# Patient Record
Sex: Male | Born: 1950
Health system: Southern US, Community
[De-identification: ages and names within clinical notes are randomized; demographics above are authoritative.]

## PROBLEM LIST (undated history)

## (undated) DIAGNOSIS — G5600 Carpal tunnel syndrome, unspecified upper limb: Secondary | ICD-10-CM

## (undated) DIAGNOSIS — G4730 Sleep apnea, unspecified: Secondary | ICD-10-CM

## (undated) DIAGNOSIS — M1990 Unspecified osteoarthritis, unspecified site: Secondary | ICD-10-CM

## (undated) DIAGNOSIS — G4733 Obstructive sleep apnea (adult) (pediatric): Secondary | ICD-10-CM

## (undated) DIAGNOSIS — T7840XA Allergy, unspecified, initial encounter: Secondary | ICD-10-CM

## (undated) DIAGNOSIS — I10 Essential (primary) hypertension: Secondary | ICD-10-CM

## (undated) HISTORY — DX: Sleep apnea, unspecified: G47.30

## (undated) HISTORY — PX: JOINT REPLACEMENT: SHX530

## (undated) HISTORY — PX: HERNIA REPAIR: SHX51

## (undated) HISTORY — DX: Carpal tunnel syndrome, unspecified upper limb: G56.00

## (undated) HISTORY — PX: KNEE SURGERY: SHX244

## (undated) HISTORY — PX: ROTATOR CUFF REPAIR: SHX139

## (undated) HISTORY — PX: NECK SURGERY: SHX720

## (undated) HISTORY — PX: SPINE SURGERY: SHX786

## (undated) HISTORY — PX: CERVICAL LAMINECTOMY: SHX94

## (undated) HISTORY — DX: Obstructive sleep apnea (adult) (pediatric): G47.33

## (undated) HISTORY — PX: BACK SURGERY: SHX140

## (undated) HISTORY — DX: Allergy, unspecified, initial encounter: T78.40XA

---

## 1998-12-03 ENCOUNTER — Encounter: Payer: Self-pay | Admitting: Orthopedic Surgery

## 1998-12-03 ENCOUNTER — Ambulatory Visit (HOSPITAL_COMMUNITY): Admission: RE | Admit: 1998-12-03 | Discharge: 1998-12-03 | Payer: Self-pay | Admitting: Orthopedic Surgery

## 2001-07-11 ENCOUNTER — Encounter: Admission: RE | Admit: 2001-07-11 | Discharge: 2001-07-11 | Payer: Self-pay | Admitting: Family Medicine

## 2001-07-11 ENCOUNTER — Encounter: Payer: Self-pay | Admitting: Family Medicine

## 2001-08-02 ENCOUNTER — Ambulatory Visit (HOSPITAL_COMMUNITY): Admission: RE | Admit: 2001-08-02 | Discharge: 2001-08-02 | Payer: Self-pay | Admitting: Neurosurgery

## 2001-08-02 ENCOUNTER — Encounter: Payer: Self-pay | Admitting: Neurosurgery

## 2001-08-05 ENCOUNTER — Inpatient Hospital Stay (HOSPITAL_COMMUNITY): Admission: RE | Admit: 2001-08-05 | Discharge: 2001-08-06 | Payer: Self-pay | Admitting: Neurosurgery

## 2001-08-05 ENCOUNTER — Encounter: Payer: Self-pay | Admitting: Neurosurgery

## 2002-04-26 ENCOUNTER — Ambulatory Visit (HOSPITAL_COMMUNITY): Admission: RE | Admit: 2002-04-26 | Discharge: 2002-04-26 | Payer: Self-pay | Admitting: Orthopedic Surgery

## 2003-04-24 ENCOUNTER — Ambulatory Visit (HOSPITAL_COMMUNITY): Admission: RE | Admit: 2003-04-24 | Discharge: 2003-04-24 | Payer: Self-pay | Admitting: Orthopedic Surgery

## 2003-05-21 ENCOUNTER — Ambulatory Visit (HOSPITAL_COMMUNITY): Admission: RE | Admit: 2003-05-21 | Discharge: 2003-05-21 | Payer: Self-pay | Admitting: Gastroenterology

## 2005-02-10 ENCOUNTER — Ambulatory Visit (HOSPITAL_BASED_OUTPATIENT_CLINIC_OR_DEPARTMENT_OTHER): Admission: RE | Admit: 2005-02-10 | Discharge: 2005-02-10 | Payer: Self-pay | Admitting: Orthopedic Surgery

## 2005-05-21 ENCOUNTER — Ambulatory Visit (HOSPITAL_BASED_OUTPATIENT_CLINIC_OR_DEPARTMENT_OTHER): Admission: RE | Admit: 2005-05-21 | Discharge: 2005-05-21 | Payer: Self-pay | Admitting: Family Medicine

## 2005-05-24 ENCOUNTER — Ambulatory Visit: Payer: Self-pay | Admitting: Internal Medicine

## 2006-03-12 ENCOUNTER — Ambulatory Visit (HOSPITAL_COMMUNITY): Admission: RE | Admit: 2006-03-12 | Discharge: 2006-03-12 | Payer: Self-pay | Admitting: Orthopedic Surgery

## 2006-04-05 ENCOUNTER — Ambulatory Visit (HOSPITAL_COMMUNITY): Admission: RE | Admit: 2006-04-05 | Discharge: 2006-04-05 | Payer: Self-pay | Admitting: Orthopedic Surgery

## 2006-05-26 ENCOUNTER — Ambulatory Visit (HOSPITAL_COMMUNITY): Admission: RE | Admit: 2006-05-26 | Discharge: 2006-05-27 | Payer: Self-pay | Admitting: Orthopedic Surgery

## 2008-07-06 ENCOUNTER — Ambulatory Visit (HOSPITAL_COMMUNITY): Admission: RE | Admit: 2008-07-06 | Discharge: 2008-07-06 | Payer: Self-pay | Admitting: Orthopedic Surgery

## 2010-09-15 ENCOUNTER — Encounter: Payer: Self-pay | Admitting: Internal Medicine

## 2010-09-15 ENCOUNTER — Ambulatory Visit
Admission: RE | Admit: 2010-09-15 | Discharge: 2010-09-15 | Payer: Self-pay | Source: Home / Self Care | Attending: Internal Medicine | Admitting: Internal Medicine

## 2010-09-15 ENCOUNTER — Other Ambulatory Visit: Payer: Self-pay | Admitting: Internal Medicine

## 2010-09-15 DIAGNOSIS — R413 Other amnesia: Secondary | ICD-10-CM | POA: Insufficient documentation

## 2010-09-15 LAB — BASIC METABOLIC PANEL
Chloride: 102 mEq/L (ref 96–112)
Creatinine, Ser: 0.8 mg/dL (ref 0.4–1.5)
GFR: 109.25 mL/min (ref >60.00)
Glucose, Bld: 93 mg/dL (ref 70–99)

## 2010-09-15 LAB — CBC WITH DIFFERENTIAL/PLATELET
Basophils Absolute: 0 10*3/uL (ref 0.0–0.1)
Basophils Relative: 0.3 % (ref 0.0–3.0)
Eosinophils Absolute: 0.2 10*3/uL (ref 0.0–0.7)
Eosinophils Relative: 2.5 % (ref 0.0–5.0)
Lymphocytes Relative: 26.7 % (ref 12.0–46.0)
Lymphs Abs: 1.7 10*3/uL (ref 0.7–4.0)
MCV: 93.2 fl (ref 78.0–100.0)
Monocytes Relative: 8.2 % (ref 3.0–12.0)
Neutrophils Relative %: 62.3 % (ref 43.0–77.0)
Platelets: 183 10*3/uL (ref 150.0–400.0)
RBC: 4.5 Mil/uL (ref 4.22–5.81)
WBC: 6.3 10*3/uL (ref 4.5–10.5)

## 2010-09-15 LAB — B12 AND FOLATE PANEL: Vitamin B-12: 1172 pg/mL — ABNORMAL HIGH (ref 211–911)

## 2010-09-15 LAB — HEPATIC FUNCTION PANEL
ALT: 26 U/L (ref 0–53)
AST: 22 U/L (ref 0–37)

## 2010-09-15 LAB — LIPID PANEL
Cholesterol: 234 mg/dL — ABNORMAL HIGH (ref 0–200)
Triglycerides: 72 mg/dL (ref 0.0–149.0)
VLDL: 14.4 mg/dL (ref 0.0–40.0)

## 2010-09-15 LAB — PSA: PSA: 1.37 ng/mL (ref 0.10–4.00)

## 2010-09-15 LAB — CONVERTED CEMR LAB

## 2010-09-24 NOTE — Assessment & Plan Note (Signed)
Summary: NEW PT CPX/AETNA/#/CD   Vital Signs:  Patient profile:   60 year old male Height:      72 inches Weight:      198 pounds BMI:     26.95 O2 Sat:      96 % on Room air Temp:     98.4 degrees F oral Pulse rate:   72 / minute Pulse rhythm:   regular Resp:     16 per minute BP sitting:   124 / 70  (left arm) Cuff size:   large  Vitals Entered By: Rock Nephew CMA (September 15, 2010 10:45 AM)  Nutrition Counseling: Patient's BMI is greater than 25 and therefore counseled on weight management options.  O2 Flow:  Room air CC: New patient physical, Preventive Care Is Patient Diabetic? No Pain Assessment Patient in pain? no        Primary Care Provider:  Etta Grandchild MD  CC:  New patient physical and Preventive Care.  History of Present Illness: New to me for a complete physical but he also complains of "memory loss" over the last year. He tells me that he is used to doing 3 things at once but now only gets one thing at a time done or will forget what he is doing. He works very effectively  in [employer]. His wife is not concerned about his memory. He does well with his ADL's and does not lose items or get lost when traveling.  Preventive Screening-Counseling & Management  Alcohol-Tobacco     Alcohol drinks/day: 1     Alcohol type: wine     >5/day in last 3 mos: no     Alcohol Counseling: not indicated; use of alcohol is not excessive or problematic     Feels need to cut down: no     Feels annoyed by complaints: no     Feels guilty re: drinking: no     Needs 'eye opener' in am: no     Smoking Status: never     Tobacco Counseling: not indicated; no tobacco use  Caffeine-Diet-Exercise     Does Patient Exercise: yes  Hep-HIV-STD-Contraception     Hepatitis Risk: no risk noted     HIV Risk: risk noted     STD Risk: no risk noted     Dental Visit-last 6 months yes     Dental Care Counseling: to seek dental care; no dental care within six  months     TSE monthly: yes     Testicular SE Education/Counseling to perform regular STE     Sun Exposure-Excessive: no  Safety-Violence-Falls     Seat Belt Use: yes     Helmet Use: yes     Firearms in the Home: firearms in the home     Firearm Counseling: to practice firearm safety     Smoke Detectors: yes     Violence in the Home: no risk noted      Sexual History:  currently monogamous.        Drug Use:  no.        Blood Transfusions:  no.    Medications Prior to Update: 1)  None  Current Medications (verified): 1)  None  Allergies (verified): No Known Drug Allergies  Past History:  Past Medical History: OSA on CPAP Allergic rhinitis CTS in both wrists  Past Surgical History: Cervical laminectomy bilateral shoulder rotator cuff repair  Family History: Family History High cholesterol Family History  of Prostate Cancer  Social History: Retired Married Never Smoked Alcohol use-yes Drug use-no Regular exercise-yes Smoking Status:  never Drug Use:  no Does Patient Exercise:  yes Hepatitis Risk:  no risk noted HIV Risk:  risk noted STD Risk:  no risk noted Dental Care w/in 6 mos.:  yes Sun Exposure-Excessive:  no Seat Belt Use:  yes Sexual History:  currently monogamous Blood Transfusions:  no  Review of Systems  The patient denies anorexia, fever, weight loss, weight gain, hoarseness, chest pain, syncope, dyspnea on exertion, peripheral edema, prolonged cough, headaches, hemoptysis, abdominal pain, melena, hematochezia, severe indigestion/heartburn, hematuria, suspicious skin lesions, difficulty walking, depression, unusual weight change, abnormal bleeding, enlarged lymph nodes, angioedema, and testicular masses.   GU:  Denies decreased libido, discharge, dysuria, erectile dysfunction, genital sores, hematuria, incontinence, nocturia, urinary frequency, and urinary hesitancy. Neuro:  Complains of memory loss; denies brief paralysis, difficulty with  concentration, disturbances in coordination, falling down, headaches, inability to speak, numbness, poor balance, seizures, sensation of room spinning, tingling, tremors, visual disturbances, and weakness.  Physical Exam  General:  alert, well-developed, well-nourished, well-hydrated, appropriate dress, normal appearance, healthy-appearing, cooperative to examination, and good hygiene.   Head:  normocephalic, atraumatic, no abnormalities observed, and no abnormalities palpated.   Eyes:  vision grossly intact, pupils equal, pupils round, and pupils reactive to light.   Ears:  R ear normal and L ear normal.   Nose:  External nasal examination shows no deformity or inflammation. Nasal mucosa are pink and moist without lesions or exudates. Mouth:  Oral mucosa and oropharynx without lesions or exudates.  Teeth in good repair. Neck:  supple, full ROM, no masses, no thyromegaly, no thyroid nodules or tenderness, no JVD, normal carotid upstroke, no carotid bruits, no cervical lymphadenopathy, and no neck tenderness.   Chest Wall:  No deformities, masses, tenderness or gynecomastia noted. Breasts:  No masses or gynecomastia noted Lungs:  normal respiratory effort, no intercostal retractions, no accessory muscle use, normal breath sounds, no dullness, no fremitus, no crackles, and no wheezes.   Heart:  normal rate, regular rhythm, no murmur, no gallop, no rub, and no JVD.   Abdomen:  soft, non-tender, normal bowel sounds, no distention, no masses, no guarding, no rigidity, no rebound tenderness, no abdominal hernia, no inguinal hernia, no hepatomegaly, and no splenomegaly.   Rectal:  No external abnormalities noted. Normal sphincter tone. No rectal masses or tenderness. Genitalia:  circumcised, no hydrocele, no varicocele, no scrotal masses, no testicular masses or atrophy, no cutaneous lesions, and no urethral discharge.   Prostate:  no gland enlargement, no nodules, no asymmetry, and no induration.   Msk:   normal ROM, no joint tenderness, no joint swelling, no joint warmth, no redness over joints, no joint deformities, no joint instability, and no crepitation.   Pulses:  R and L carotid,radial,femoral,dorsalis pedis and posterior tibial pulses are full and equal bilaterally Extremities:  No clubbing, cyanosis, edema, or deformity noted with normal full range of motion of all joints.   Neurologic:  No cranial nerve deficits noted. Station and gait are normal. Plantar reflexes are down-going bilaterally. DTRs are symmetrical throughout. Sensory, motor and coordinative functions appear intact. Skin:  turgor normal, color normal, no rashes, no suspicious lesions, no ecchymoses, no petechiae, no purpura, no ulcerations, and no edema.   Cervical Nodes:  no anterior cervical adenopathy and no posterior cervical adenopathy.   Axillary Nodes:  no R axillary adenopathy and no L axillary adenopathy.   Inguinal Nodes:  no R inguinal adenopathy and no L inguinal adenopathy.   Psych:  Cognition and judgment appear intact. Alert and cooperative with normal attention span and concentration. No apparent delusions, illusions, hallucinations   Impression & Recommendations:  Problem # 1:  MEMORY LOSS (ICD-780.93) Assessment New I think this a benign scenario Orders: Venipuncture ([address]) TLB-B12 + Folate Pnl (60454_09811-B14/NWG) TLB-BMP (Basic Metabolic Panel-BMET) (80048-METABOL) TLB-CBC Platelet - w/Differential (85025-CBCD) TLB-Hepatic/Liver Function Pnl (80076-HEPATIC) TLB-TSH (Thyroid Stimulating Hormone) (84443-TSH) TLB-Lipid Panel (80061-LIPID) TLB-PSA (Prostate Specific Antigen) (84153-PSA)  Problem # 2:  ROUTINE GENERAL MEDICAL EXAM@HEALTH  CARE FACL (ICD-V70.0)  Orders: Venipuncture (95621) TLB-B12 + Folate Pnl (30865_78469-G29/BMW) TLB-BMP (Basic Metabolic Panel-BMET) (80048-METABOL) TLB-CBC Platelet - w/Differential (85025-CBCD) TLB-Hepatic/Liver Function Pnl (80076-HEPATIC) TLB-TSH (Thyroid  Stimulating Hormone) (84443-TSH) TLB-Lipid Panel (80061-LIPID) TLB-PSA (Prostate Specific Antigen) (84153-PSA) EKG w/ Interpretation (93000) Hemoccult Guaiac-1 spec.(in office) (82270)  Colorectal Screening:  Current Recommendations:    Hemoccult: NEG X 1 today    Colonoscopy recommended: patient defers today but will consider in the future  Colonoscopy Results:    Date of Exam: 10/28/2004    Results: Normal  PSA Screening:    Reviewed PSA screening recommendations: PSA ordered  Immunization & Chemoprophylaxis:    Tetanus vaccine: Historical  (08/17/2005)  Patient Instructions: 1)  Please schedule a follow-up appointment as needed. 2)  Schedule a colonoscopy/sigmoidoscopy to help detect colon cancer.   Orders Added: 1)  Venipuncture [36415] 2)  TLB-B12 + Folate Pnl [82746_82607-B12/FOL] 3)  TLB-BMP (Basic Metabolic Panel-BMET) [80048-METABOL] 4)  TLB-CBC Platelet - w/Differential [85025-CBCD] 5)  TLB-Hepatic/Liver Function Pnl [80076-HEPATIC] 6)  TLB-TSH (Thyroid Stimulating Hormone) [84443-TSH] 7)  TLB-Lipid Panel [80061-LIPID] 8)  TLB-PSA (Prostate Specific Antigen) [41324-MWN] 9)  New Patient 40-64 years [99386] 10)  EKG w/ Interpretation [93000] 11)  Hemoccult Guaiac-1 spec.(in office) [82270]    Preventive Care Screening  Last Tetanus Booster:    Date:  08/17/2005    Results:  Historical

## 2010-09-24 NOTE — Letter (Signed)
Summary: Lipid Letter  [facility]  [address] [place], [place] [address]   Phone: [phone]  Fax: [phone]    09/15/2010  Ronald Franklin [address] [place], [place]  [address]  Dear Ronald Franklin:  We have carefully reviewed your last lipid profile from  and the results are noted below with a summary of recommendations for lipid management.    Cholesterol:       234     Goal: <200 too high   HDL "good" Cholesterol:   57.84     Goal: >40 good   LDL "bad" Cholesterol:   173     Goal: <130 too high   Triglycerides:       72.0     Goal: <150    your other labs look real good, let me know if you want to start a cholesterol medicine    TLC Diet (Therapeutic Lifestyle Change): Saturated Fats & Transfatty acids should be kept < 7% of total calories ***Reduce Saturated Fats Polyunstaurated Fat can be up to 10% of total calories Monounsaturated Fat Fat can be up to 20% of total calories Total Fat should be no greater than 25-35% of total calories Carbohydrates should be 50-60% of total calories Protein should be approximately 15% of total calories Fiber should be at least 20-30 grams a day ***Increased fiber may help lower LDL Total Cholesterol should be < 200mg /day Consider adding plant stanol/sterols to diet (example: Benacol spread) ***A higher intake of unsaturated fat may reduce Triglycerides and Increase HDL    Adjunctive Measures (may lower LIPIDS and reduce risk of Heart Attack) include: Aerobic Exercise (20-30 minutes 3-4 times a week) Limit Alcohol Consumption Weight Reduction Aspirin 75-81 mg a day by mouth (if not allergic or contraindicated) Dietary Fiber 20-30 grams a day by mouth     Current Medications:  None If you have any questions, please call. We appreciate being able to work with you.   Sincerely,    [facility] Etta Grandchild MD

## 2010-12-30 NOTE — Op Note (Signed)
NAME:  Ronald Franklin, Ronald Franklin NO.:  [id number]   MEDICAL RECORD NO.:  [id number]          PATIENT TYPE:  INP   LOCATION:  2550                         FACILITY:  [facility]   PHYSICIAN:  Almedia Balls. Ranell Patrick, M.D. DATE OF BIRTH:  November 09, 1950   DATE OF PROCEDURE:  07/06/2008  DATE OF DISCHARGE:  07/06/2008                               OPERATIVE REPORT   PREOPERATIVE DIAGNOSES:  Right shoulder rotator cuff tear, subluxation  of the biceps tendon with possible superior labrum anterior and  posterior lesion as well as AC joint arthritis.   POSTOPERATIVE DIAGNOSES:  1. Right shoulder rotator cuff tear.  2. Right shoulder subscapularis tear.  3. Dislocating biceps tendon with superior labrum anterior and      posterior lesion.  4. Glenohumeral degenerative joint disease.  5. Grade 3 chondromalacia and impingement as well as symptomatic      acromioclavicular joint arthritis.   PROCEDURE PERFORMED:  Right shoulder exam under anesthesia, shoulder  arthroscopy, extensive intraarticular debridement of SLAP lesion,  rotator cuff tear, subscapularis tear, biceps tenotomy, arthroscopic  subacromial decompression, mini-open rotator cuff repair, open biceps  tenodesis, and open distal clavicle excision.   SURGEON:  Almedia Balls. Ranell Patrick, M.D.   ASSISTANT:  Donnie Coffin. Dixon, PA-C.   ANESTHESIA:  General anesthesia plus interscalene block anesthesia was  used.   ESTIMATED BLOOD LOSS:  Minimal.   FLUID REPLACEMENT:  1500 mL crystalloid.   INSTRUMENT COUNT:  Correct.   COMPLICATIONS:  None.   Preoperative antibiotics were given.   INDICATIONS:  The patient is a 60 year old male with worsening right  shoulder pain secondary to rotator cuff tear as well as AC joint  arthritis.  The patient has a subluxing biceps tendon and suspected  subscapularis tear as well.  The patient presents now for operative  treatment having failed conservative management.   DESCRIPTION OF PROCEDURE:  After an  adequate level of anesthesia was  achieved, the patient was positioned in the modified beach chair  position.  Right shoulder was examined under anesthesia.  The patient  had full passive range of motion of the shoulder with no instability.  We sterilely prepped and draped the right shoulder and in the end of the  shoulder used arthroscopic portals in the usual fashion.  Anterior and  posterior lateral portals were created using a 11-blade scalpel and  introduction of the cannula into the joint using blunt obturators.  We  identified quite a bit of inflammation in the joint with synovitis.  There was a torn superior labrum with unstable biceps anchor.  The  biceps was dislocated into the substance of the subscapularis tendon  which had an upper portion tear.  We performed a biceps tenotomy and  labral debridement with superior labral tear.  We then debrided a little  of the subscapular upper insertion.  This appeared to be limited to the  upper 10%.  The rotator cuff was torn, this was a linear supraspinatus  and infraspinatus tear.  We performed debridement of that tissue, it was  greater than 30% in thickness of the tendon in the foot.  The  anteroinferior labrum was strongly debrided using basket forceps and  motorized shaver.  There is some glenohumeral chondromalacia noted grade  3 in multiple areas.  Posterior labrum was intact.  At this point, we  went ahead and placed the scope in subacromial space, performed a  thorough bursectomy and acromioplasty with release of AC ligament.  At  this point, we concluded the arthroscopic portion of the surgery.  We  made a Saber incision along the [facility] joint.  Dissection was carried down  through the skin and subcutaneous tissues.  We split the deltoid-  trapezius fascia in line with distal clavicle.  Subperiosteal dissection  in the distal clavicle was performed followed by excision of distal 4 mm  using oscillating saw.  We thoroughly irrigated  the AC interval and then  applied bone wax to cut into clavicle.  We then repaired the deltoid-  trapezius fascia with 0-Vicryl suture interrupted figure-of-eight  followed by 2-0 Vicryl subcutaneous closure 4-0 Monocryl of skin.  This  mini open incision was created at the anterolateral border of the  acromion extending down for about 3 or 4 cm.  Dissection was carried  down through subcutaneous tissues.  We split the deltoid and the raphe  between the anterior and lateral heads, identified the bicipital groove,  incised the soft tissue overlying the bicipital groove and then  delivered the tendon and the wound.  We whip stitched at the anchor  point forward with the elbow at 90 degrees and made tension using #2  FiberWire suture.  We then placed a single Panalok 3.5 anchor 3-4 at the  bicipital groove.  We scuffed up before the bicipital groove first.  We  brought those sutures up with free needles, taper needles through the  whip stitched area in the biceps tendon to prevent pullout.  We then  tied the tendon flesh in the bicipital canal or groove.  Once that was  tied, we went ahead and then cut the end of the biceps off.  We went  ahead then and oversew the remaining biceps into the soft tissue  overlying the biceps groove with a 0-Vicryl suture of figure-of-eight.  We had a nice low-profile repair.  At this point, we went ahead and  addressed the rotator cuff tear as palpably torn just about a finger-  breadth posterior to the biceps groove.  We went and incised that along  with the fibers.  There was quite a bit of degenerative tissue which we  removed using rongeurs, curettes.  We went ahead and scuffed up the  rotator cuff foot print.  We went ahead and placed a single 5.5 Bio-  Corkscrew anchor adjacent to the articular muscle and placed a mattress  suture.  #2 FiberWire medial to the repair site.  We placed it side-to-  side #2 FiberWire suture which we did not tie yet.  We went  ahead and  freed up the rotator cuff with the Cobb elevator on both sides of the  cuff.  The posterior aspect moved nicely up to meet the forward aspect  of the rotator cuff which was the attachment was not violated.  At this  point, we brought those suture anchor sutures up in a mattress fashion  to restore the medial foot print and tie those.  We tied the side-to-  side suture and did another one with side-to-side and then brought the  medial most side-to-side suture and the mattress suture over the top of  the entire repair to apply at the lateral portion of the foot print and  used a single 4.5 push lock out laterally.  Had a nice low profile  repair, took the shoulder through a full range of motion, no impingement  was noted.  We thoroughly irrigated the interval and then repaired the  deltoid to itself with 0-Vicryl figure-of-eight followed by 2-0 Vicryl  subcutaneous closure and 4-0 Monocryl to the skin.  Steri-Strips were  applied followed by a sterile dressing.      Almedia Balls. Ranell Patrick, M.D.  Electronically Signed     SRN/MEDQ  D:  07/06/2008  T:  07/07/2008  Job:  [id number]

## 2011-01-02 NOTE — Op Note (Signed)
NAME:  Ronald Franklin, Ronald Franklin                         ACCOUNT NO.:  [id number]   MEDICAL RECORD NO.:  [id number]                   PATIENT TYPE:  AMB   LOCATION:  DAY                                  FACILITY:  [facility]   PHYSICIAN:  Marlowe Kays, M.D.               DATE OF BIRTH:  08/31/1950   DATE OF PROCEDURE:  04/24/2003  DATE OF DISCHARGE:                                 OPERATIVE REPORT   PREOPERATIVE DIAGNOSIS:  Torn medial meniscus, left knee.   POSTOPERATIVE DIAGNOSIS:  1. Torn medial meniscus.  2. Grade 2/4 chondromalacia, medial femoral condyle, patella left knee.   OPERATION/PROCEDURE:  1. Left knee arthroscopy with one partial medial meniscectomy.  2. Shaving of medial femoral condyle, patella.   SURGEON:  Marlowe Kays, M.D.   ANESTHESIA:  General.   PATHOLOGY AND INDICATIONS:  Injured his left knee at work in early July  because of persistent pain.  Obtained an MRI which was performed on April 06, 2003, demonstrating some early patellar cartilage thinning and a  horizontal tear of the posterior horn body of the medial meniscus.  At  surgery also had disruption of the anterior third of the medial meniscus  with a tear in line with the meniscus and also grade 2/4 chondromalacia of  the medial femoral condyle, portion of which might have been due to digging  of this portion of the meniscus into the medial femoral condyle.  Lateral  compartment of the knee joint and ACL look normal.   DESCRIPTION OF PROCEDURE:  Satisfactory general anesthesia.  Pneumatic  tourniquet.  Thigh stabilizer.  Left knee was prepped with DuraPrep and  draped in the sterile field.  Ace wrap to the right leg.  Superior medial  saline inflow.  First from an anterolateral portal, medial compartment of  the knee joint was evaluated.  The abnormality of the anterior third of the  medial meniscus was noted and after debriding out of synovium, I was able to  place a probe beneath the underneath a  bucket-handle-type tear demonstrating  it.  I then cut the meniscus at its junction with the anterior mid third  with small scissors and resected the torn partially cut piece, as well as  smoothing down the junction between normal and abnormal meniscus.  He also  had at least one free fragment of meniscus in the joint which I pictured and  removed.  Smoothed down gently the medial femoral condyle with the shaver.  Posteriorly had a badly comminuted complex tear which I removed with a  combination of small scissors, baskets and shaving down until smooth with a  3.5 shaver.  Looking up __________ suprapatellar area is where the patella  particularly at its apex which I was only partially able to shave down  through this portal.  Then reverse portals laterally, this compartment  looked normal, but I was able the shave down the patella,  smoothing down  with final pictures being taken.  The knee joint was irrigated until clear  and all four __________ removed.  The two anterior portals were closed with  4-0 nylon.  Twenty cc of 0.5% Marcaine with adrenalin and 4 mg of morphine  were then instilled  through the entire apparatus which was removed and portals closed with 4-0  nylon as well.  Betadine, Adaptic and dry sterile dressing were applied.  The tourniquet was released.  He tolerated the procedure well and taken to  the recovery room in satisfactory condition.  There were no complications.                                                Marlowe Kays, M.D.    JA/MEDQ  D:  04/24/2003  T:  04/24/2003  Job:  [id number]

## 2011-01-02 NOTE — Op Note (Signed)
NAME:  Ronald Franklin, Ronald Franklin NO.:  [id number]   MEDICAL RECORD NO.:  [id number]          PATIENT TYPE:  OIB   LOCATION:  0098                         FACILITY:  [facility]   PHYSICIAN:  Almedia Balls. Ranell Patrick, M.D. DATE OF BIRTH:  03-19-1951   DATE OF PROCEDURE:  05/26/2006  DATE OF DISCHARGE:  05/27/2006                                 OPERATIVE REPORT   PREOPERATIVE DIAGNOSIS:  Left proximal biceps rupture.   POSTOPERATIVE DIAGNOSIS:  Left proximal biceps rupture.   PROCEDURE PERFORMED:  Left shoulder proximal biceps tenodesis.   ATTENDING SURGEON:  Beverely Low, M.D.   ASSISTANT:  None.   ANESTHESIA:  General anesthesia was used plus interscalene block.   ESTIMATED BLOOD LOSS:  Minimal.   FLUID REPLACEMENT:  1200 mL crystalloid.   INSTRUMENT COUNT:  Correct.   COMPLICATIONS:  None.   PERIOPERATIVE ANTIBIOTICS:  Given.   INDICATIONS:  The patient is a 60 year old male status post recent shoulder  arthroscopy 6 weeks ago.  The patient underwent debridement of his superior  labral tear and treated posteriorly with biceps tenodesis using Arthrex Bio-  Tenodesis Screw.  The patient was doing well up until several days ago when  he fell holding onto a heavy object and fell with his entire body, weight  back on the arm.  He felt an immediate pop in the arm and noticed ecchymosis  and deformity consistent with a proximal biceps rupture.  The patient  presented to the physical therapist, who suspected that, brought him over to  orthopedics, and we confirmed the diagnosis of a proximal biceps rupture.  The patient presents now for treatment of the re-rupture of his biceps.  Informed consent was obtained.   DESCRIPTION OF PROCEDURE:  After an adequate level of anesthesia was  achieved, the patient was positioned in the modified beach chair position.  Left shoulder was sterilely prepped and draped in the usual manner.  We  first entered through the prior biceps tenodesis  incision overlying the  biceps groove, dissection carried through subcutaneous tissues.  Using the  Bovie, I identified the deltoid, split it in its prior incision which was in  line with the muscle fibers and the raphe between the anterior and lateral  head.  We identified the bicipital groove inside the soft tissue which has  healed over the top of that and identified the screw which was still in  place in the hole in the anterior humerus.  In addition, the sutures were  still in place.  I pulled the screw out.  Sutures were still attached to  about a 25 mm portion of tendon, and the tendon had torn just below the  suture line.  At this point, we went ahead and made a separate incision down  further on the anterior aspect of the biceps.  Longitudinal incision was  begun with a 10 blade scalpel.  Dissection was carried sharply in __________  using Bovie electrocautery, identifying the biceps muscle and tendon, freed  it up from surrounding soft tissues, whip-stitched with #2 Fibrewire suture,  and then delivered that up through a  soft tissue tunnel that I created back  into the prior surgical site.  Next, we went ahead and looked at our tendon  length.  We were going to have to move the hole down the humerus about 30 mm  just to be able to have enough tendon for tenodesis using the Bio-Tenodesis  System.  We then drilled an 8 mm hole, went in and placed the tendon in that  and tenodesed at mid tension with the elbow at 90 degrees with a 9 x 23 mm  Bio-Tenodesis Screw by Arthrex.  We had excellent purchase on the screw.  We  went ahead and tied the 2 sutures to each other and then closed the soft  tissue overlying the upper portion of the soft tissue dissection in that  upper wound, and then we closed the deltoid with 0 Vicryl suture.  Subcutaneous closure with 2-0 Vicryl in both wounds and then 4-0 Monocryl  running subcuticular stitch in both areas.  Steri-Strips were applied  followed by  a sterile dressing and a shoulder sling.  The patient taken to  the recovery room.           ______________________________  Almedia Balls Ranell Patrick, M.D.     SRN/MEDQ  D:  05/26/2006  T:  05/28/2006  Job:  [id number]

## 2011-01-02 NOTE — Op Note (Signed)
NAME:  Ronald Franklin, Ronald Franklin NO.:  [id number]   MEDICAL RECORD NO.:  [id number]          PATIENT TYPE:  OIB   LOCATION:  0098                         FACILITY:  [facility]   PHYSICIAN:  Almedia Balls. Ranell Patrick, M.D. DATE OF BIRTH:  27-May-1951   DATE OF PROCEDURE:  04/05/2006  DATE OF DISCHARGE:                                 OPERATIVE REPORT   PREOPERATIVE DIAGNOSIS:  Left shoulder pain secondary to superior labral  tear anterior to posterior as well as symptomatic acromioclavicular joint  arthritis.   POSTOPERATIVE DIAGNOSES:  1. Left shoulder superior labral tear anterior to posterior with unstable      biceps anchor.  2. Left shoulder partial-thickness rotator cuff tear.  3. Left shoulder acromioclavicular joint arthritis, symptomatic.  4. No sign of subscapularis tear.  5. Glenoid chondromalacia grade 4, focal.   PROCEDURE PERFORMED:  Left shoulder examination under anesthesia, shoulder  arthroscopy with extensive intraarticular debridement of torn superior  labrum anterior to posterior with arthroscopic biceps tenotomy, followed by  arthroscopic debridement of partial-thickness r cuff tear, arthroscopic  subacromial decompression with an open biceps tenodesis in the groove using  7 x 23-mm Arthrex BioTenodesis screw, open distal clavicle excision, and  arthroscopic abrasion chondroplasty.   ATTENDING SURGEON:  Almedia Balls. Ranell Patrick, M.D.   ASSISTANT:  Konrad Felix Dixon, P.A.-C.   ANESTHESIA:  General plus interscalene block anesthesia used.   ESTIMATED BLOOD LOSS:  Minimal.   FLUID REPLACEMENT:  1200 mL crystalloid.   INSTRUMENT COUNTS:  Correct.   COMPLICATIONS:  None.   ANTIBIOTICS:  Given.   INDICATIONS:  The patient is a 60 year old male with a history of worsening  left shoulder pain.  The patient failed conservative management to this  point and has an MRI indicating a torn superior labrum as well as  symptomatic AC arthrosis.  He presents now for failure to  progress with  conservative management, desiring pain relief and restoration of function.  Informed consent was obtained.   DESCRIPTION OF THE PROCEDURE:  After an adequate level of anesthesia was  achieved the patient was positioned in a modified beach chair position, all  neurovascular structures padded appropriately.  The left shoulder examined  under anesthesia.  Full passive range of motion with external rotation out  to 50-60 degrees noted.  After sterile prep and drape of the left shoulder,  we entered the shoulder arthroscopically using standard arthroscopic  portals.  This revealed a torn superior labrum anterior to posterior with  unstable biceps anchor.  Labral fraying and  degeneration continued down  into the anterior inferior labrum as well as some significant chondral  damage on the glenoid measuring about 2 x 5 mm and this was debrided in a  chondroplasty technique, smoothing out the extra cartilage.  There was no  wear on the humeral head side.  Subscapularis was noted to be completely  normal.  Rotator cuff tear had a partial-thickness tear which was debrided  back to stable rotator cuff tissue and freshening out the footprint.  This  represented less than 25% of thickness of the tendon.  We went  ahead and  performed a superior labral debridement as well, utilizing basket forceps as  well as motorized shaver including biceps tenotomy, and then checked from  the anterior perspective looking posteriorly and noted there to be  continuous labral  tear down to the 2o'clock position on the left shoulder.  At this point, went ahead with thorough debridement of the labrum, biceps  tenotomy, and debridement of the rotator cuff tear.  We then switched into  the subacromial space with the scope, performed a pleural bursectomy,  visualizing the rotator cuff on the bursal side and noting it to be normal.  Next, we went ahead and performed acromioplasty, creating a type 1 acromial   shape over the anterior spur and releasing the CA ligament.  The rotator  outlet was thoroughly opened.  Again, the bursectomy was performed  arthroscopically all the way down to the gutters and then concluded.  We  went ahead and first addressed the biceps tenodesis through a mini open  incision in the raphe between the anterior and lateral heads of the deltoid.  Skin incision with #13 blade scalpel, dissection carried down to  subcutaneous tissues using a Bovie.  Identified the raphe between the  anterior and lateral heads of the deltoid, split that using the needle-tip  Bovie.  As I came down through the inferior or deep fascia of the deltoid,  the Bovie did score the top of the rotator cuff.  This fortuitously was in  line with the fibers and actually right in the location of the partial-  thickness rotator cuff tear we visualized.  Thus, I made a decision to take  the 15-blade scalpel and go ahead and complete that incision all the way  down through the fibers, essentially parting the fibers to the cuff,  allowing [place] to get a good inspection of that partial-thickness tear.  We  noted some degenerative rotator cuff tissue in that area, removed that  utilizing a rongeur, and then repaired the cuff side-to-side utilizing  varied #2 FiberWire suture.  Had a nice, strong, side-to-side rotator cuff  repair, low profile, with no impingement with full range of motion  afterwards.  At this point, went ahead and made an incision overlying the  bicipital groove.  Dissection carried down through soft tissues into the  bicipital groove and then tenodesed the biceps midtension with the elbow at  90 degrees using a 7 x 23-mm Arthrex BioTenodesis screw.  Closed the soft  tissue over the top of that with a nice low-profile repair.  Then,  thoroughly irrigated that subdeltoid area, took my finger and inspected the subscapularis digitally and noted that to be normal and robust going all the  way over to  coracoid.  Also could feel the conjoin tendon as well which was  felt to be intact.  Following this, closed the deltoid to itself with  interrupted 0 Vicryl suture followed by 2-0 Vicryl subcutaneous and 4-0  Monocryl for skin.  Then I addressed the [facility] pathology  through a saber  incision.  This was done over the [facility] joint.  Dissection carried sharply down  through subcutaneous tissues to the deltotrapezial fascia which was  identified and incised in line with the distal clavicle.  Facet periosteal  dissection of distal clavicle performed, followed by excision of the distal  7 mm using a oscillating saw.  At this point we thoroughly irrigated the [facility]  interval, applied __________ coating the clavicle, and then again irrigated,  checking for  any loose wax.  I went ahead and closed the deltotrapezial  fascia side-to-side with interrupted 0 Vicryl suture figure-of-eight,  followed by 2-0 Vicryl subcutaneously and 4-0 Monocryl for skin.  Steri-  Strips were applied followed by a sterile dressing.  The patient tolerated  the surgery well.           ______________________________  Almedia Balls. Ranell Patrick, M.D.     SRN/MEDQ  D:  04/05/2006  T:  04/05/2006  Job:  [id number]

## 2011-01-02 NOTE — Op Note (Signed)
[place]  Patient:    Ronald Franklin, Ronald Franklin Visit Number: [id number] MRN: [id number]          Service Type: SUR Location: [id number] Attending Physician:  Danella Penton Dictated by:   Tanya Nones. Jeral Fruit, M.D. Proc. Date: 08/05/01 Admit Date:  08/05/2001 Discharge Date: 08/06/2001                             Operative Report  PREOPERATIVE DIAGNOSIS:  Left C6 and C7 herniated disk with 1/5 weakness in the left triceps.  POSTOPERATIVE DIAGNOSIS:  Left C6 and C7 herniated disk with 1/5 weakness in the left triceps.  PROCEDURE:  Anterior C6 and C7 diskectomy, removal of several free fragments, foraminotomy, decompression of the spinal cord, bone bank graft, plate, microscope.  SURGEON:  Tanya Nones. Jeral Fruit, M.D.  ASSISTANT:  Stefani Dama, M.D.  CLINICAL HISTORY:  The patient is a 60 year old gentleman who had been complaining of neck and left upper extremity up to the point that the triceps is 1/5.  Treatment was no help.  X-ray showed herniated disk at the level of C6 and C7.  Surgery was advised and the risks were explained in the History and Physical.  DESCRIPTION OF PROCEDURE:  The patient was taken to the OR, and after intubation, the left side of the neck was prepped with Betadine.  A transverse incision was made through the skin and platysma down to the cervical spine. We found two large osteophytes at the level of 5-6 and 6-7 anteriorly and we knew that by x-ray.  We did an x-ray and we were at the level of C6 and C7. Then the anterior ligament was opened and using the microscope, we did a total gross diskectomy using the pituitary rongeur as well as the curets.  Then, we opened the posterior ligament, and indeed in the left side, there was spondylosis with like three or four fragments compromising the C7 nerve root. The nerve root was swollen.  Decompression was done.  The same procedure was done at the right side where he  had previous surgery.  Foraminotomy was accomplished.  There was some __________ in the midline which were removed. Using the drill, we drilled the endplate and a piece of bone graft of 8 mm was inserted.  This was followed with a plate using four screws.  Lateral C-spine showed good position of bone graft and the plate.  From then on, the area was irrigated.  Investigation of the surrounding tissue was negative.  Then the wound was closed with Vicryl and Steri-Strips. Dictated by:   Tanya Nones. Jeral Fruit, M.D. Attending Physician:  Danella Penton DD:  08/05/01 TD:  08/08/01 Job: [id number]

## 2011-01-02 NOTE — Op Note (Signed)
NAME:  Ronald Franklin, Ronald Franklin               ACCOUNT NO.:  [id number]   MEDICAL RECORD NO.:  [id number]          PATIENT TYPE:  AMB   LOCATION:  DSC                          FACILITY:  [facility]   PHYSICIAN:  Katy Fitch. Sypher, M.D. DATE OF BIRTH:  [birth year]   DATE OF PROCEDURE:  02/10/2005  DATE OF DISCHARGE:                                 OPERATIVE REPORT   PREOPERATIVE DIAGNOSIS:  Mucous cyst/mass adjacent to left thumb IP dorsal  aspect.   POSTOPERATIVE DIAGNOSIS:  Osteoarthrosis of left thumb IP joint with full-  thickness chondromalacia on dorsal radial aspect of proximal phalangeal head  measuring approximately 6 x 6 mm with marginal pannus formation.   OPERATION:  Arthrotomy of left thumb IP joint with subsequent synovectomy  and pannus excision and debridement of mucous cyst type lesion on dorsal  radial aspect of left thumb.   OPERATING SURGEON:  Katy Fitch. Sypher, M.D.   ASSISTANT:  Molly Maduro Dasnoit PA-C.   ANESTHESIA:  2% lidocaine and 0.25% Marcaine metacarpal head level block of  left thumb.   This was performed in the minor operating room at the [facility] day surgery  center.   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   He has lived a very active and vigorous lifestyle. 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   He has background osteoarthritis including Heberden's nodes.   He was noted to have a mass in the dorsal aspect of his left thumb IP joint  that was uncomfortable.   He requested excision.   He has had a history of carpal tunnel syndrome which at present is  quiescent.   We that Ronald Franklin in the minor operating room and confirmed that his left  thumb IP joint was painful, crepitant and had a mass on the dorsal radial  aspect.   He confirmed that this was the site of his discomfort.   His exam revealed a less prominent mass that his original office visit of  January 26, 2005; however, he clearly had a fullness palpable on the dorsal  radial aspect of his left thumb IP joint.   After informed consent his thumb was anesthetized with 0.25% Marcaine and 2%  lidocaine at metacarpal head level.   Upon checking his level of anesthesia after five minutes, he still was  slightly sensate on the dorsal radial aspect of the thumb. Therefore,  further 2% lidocaine was infiltrated along the path of the dorsal radial  sensory branches achieving a complete digital block.   The arm was then prepped with Betadine soap and solution and sterilely  draped.   A Penrose drain was used at the base of the thumb after gauze wrap as a  digital tourniquet.   After confirming the site of the mass, a curvilinear incision was fashioned  directly over the mass.  The subcutaneous tissues were carefully divided  revealing a bulging dorsal radial IP joint capsule.   The cyst was circumferentially dissected and removed with a fine rongeur.  The capsule between the terminal extensor tendon slip and radial collateral  ligament was resected and the joint cartilage inspected.   The IP joint was thoroughly irrigated with a blunt dental needle.   There was noted to be intact hyaline articular cartilage surfaces on the  distal phalangeal articular facets. The distal surface of the proximal  phalanx had a lesion measuring 6 x 6 mm with grade 4 chondromalacia and  marginal pannus formation.   I used a micro rongeur to debride all of the pannus along the margin of the  joint and synovitis deep to the radial collateral ligament. The hyaline  cartilage was debrided of all pathologic appearing synovitis and gently  debrided to create a chondroplasty.   The joint was then thoroughly irrigated.   I meticulously palpated the ulnar aspect of the joint and could not identify  a similar lesion on the dorsal ulnar aspect of the joint.   The wound was then irrigated and closed with  mattress sutures of 5-0 nylon.   There were no apparent complications.   For aftercare, Ronald Franklin was provided prescriptions for Vicodin 5 milligrams  one p.o. q. 4 to 6 hours p.r.n. pain, 24 tablets without refill. Also, he is  provided Keflex 500 milligrams one p.o. q.8 h. times four days as  prophylactic antibiotic due to joint injury.   Ronald Franklin will return to see [place] in follow-up in one week.       RVS/MEDQ  D:  02/10/2005  T:  02/10/2005  Job:  [id number]   cc:   Teena Irani. Arlyce Dice, M.D.  [address]  [place]  [place] [address]  Fax: [phone]

## 2011-01-02 NOTE — Procedures (Signed)
NAME:  Ronald Franklin, Ronald Franklin NO.:  [id number]   MEDICAL RECORD NO.:  [id number]          PATIENT TYPE:  OUT   LOCATION:  SLEEP CENTER                 FACILITY:  [facility]   PHYSICIAN:  Clinton D. Maple Hudson, M.D. DATE OF BIRTH:  [birth year]   DATE OF STUDY:  05/21/2005                              NOCTURNAL POLYSOMNOGRAM   REFERRING PHYSICIAN:  Dr. Evelena Peat.   DATE OF STUDY:  May 21, 2005.   INDICATION FOR STUDY:  Hypersomnia with sleep apnea. Epworth sleepiness  score 8/24, BMI of 26. Weight 192 pounds.   SLEEP ARCHITECTURE:  Total sleep time 312 minutes with sleep efficiency 63%.  Stage I was 14%, stage II 63%, stages III and IV 9%, REM 14% of total sleep  time. Sleep latency 47 minutes, REM latency 260 minutes, awake after sleep  onset 135 minutes, arousal index increased at 63. He had sustained  difficulty maintaining sleep from about 12:45 to 2:45 a.m. No bedtime  medication taken.   RESPIRATORY DATA:  Split study protocol. Apnea/hypopnea index (AHI, RDI)  70.6 obstructive events per hour indicating severe obstructive sleep  apnea/hypopnea syndrome before C-PAP. There were 89 obstructive apneas, 1  central apnea, 1 mixed apnea, and 55 hypopneas before C-PAP. He slept almost  exclusively supine and all events were reported in that position. REM AHI  4.2 per hour. C-PAP was titrated to 14 CWP, AHI 0 per hour. The technician  did not indicate which mask style was used for the study.   OXYGEN DATA:  Very loud snoring with oxygen desaturation to a nadir of 88%  before C-PAP. After C-PAP control oxygen saturation held 95-98% on room air.   CARDIAC DATA:  Sinus rhythm with sinus bradycardia 86-49 beats per minute.   MOVEMENT/PARASOMNIA:  Occasional leg jerk with little effect on sleep.   IMPRESSION/RECOMMENDATIONS:  1.  Severe obstructive sleep apnea/hypopnea syndrome, AHI 70.6 per hour with      very loud snoring and oxygen desaturation to 88%.  2.  Successful  C-PAP titration to 14 CWP, AHI 0 per hour. The technician did      not indicate which mask style was used for this study but it can be      provided through the home care company.      Clinton D. Maple Hudson, M.D.  Diplomate, Biomedical engineer of Sleep Medicine  Electronically Signed     CDY/MEDQ  D:  05/24/2005 14:49:23  T:  05/25/2005 16:10:96  Job:  [id number]

## 2011-01-02 NOTE — Op Note (Signed)
   NAME:  Ronald Franklin, Ronald Franklin                         ACCOUNT NO.:  [id number]   MEDICAL RECORD NO.:  [id number]                   PATIENT TYPE:  AMB   LOCATION:  ENDO                                 [facility]ACILITY[facility]   PHYSICIAN:  John C. Madilyn Fireman, M.D.                 DATE OF BIRTH:  10-12-1950   DATE OF PROCEDURE:  05/21/2003  DATE OF DISCHARGE:                                 OPERATIVE REPORT   PROCEDURE PERFORMED:  Colonoscopy.   ENDOSCOPIST:  Barrie Folk, M.D.   INDICATIONS FOR PROCEDURE:  Colon cancer screening in a 60 year old patient  with no previous with no previous screening.   DESCRIPTION OF PROCEDURE:  The patient was placed in the left lateral  decubitus position and placed on the pulse monitor with continuous low-flow  oxygen delivered by nasal cannula.  The patient was sedated with 62.5 mcg of  IV fentanyl and 5 mg of IV Versed.  The Olympus video colonoscope was  inserted into the rectum and advanced to the cecum, confirmed by  transillumination of McBurney's point and visualization of the ileocecal  valve and appendiceal orifice.  The prep was excellent.  The cecum,  ascending, transverse, descending and sigmoid colon appeared normal with no  masses, polyps, diverticula or other mucosal abnormalities.  The rectum  likewise appeared normal and retroflex view of the anus revealed no obvious  internal hemorrhoids.  The scope was then withdrawn and the patient returned  to the recovery room in stable condition.  He tolerated the procedure well.  There were no immediate complications.   IMPRESSION:  Normal colonoscopy.   PLAN:  Next colon screening by sigmoidoscopy in five years.                                               John C. Madilyn Fireman, M.D.    JCH/MEDQ  D:  05/21/2003  T:  05/21/2003  Job:  [id number]   cc:   Teena Irani. Arlyce Dice, M.D.  [address]  [place]  [place] [address]  Fax: [phone]   Chales Salmon. Abigail Miyamoto, M.D.  [address]  [place]  [place] [address]  Fax: [phone]

## 2011-01-02 NOTE — H&P (Signed)
[place]  Patient:    Ronald Franklin, Ronald Franklin Visit Number: [id number] MRN: [id number]          Service Type: SUR Location: [id number] Attending Physician:  Danella Penton Dictated by:   Tanya Nones. Jeral Fruit, M.D. Admit Date:  08/05/2001                           History and Physical  HISTORY OF PRESENT ILLNESS:  Ronald Franklin is a gentleman who was seen by me initially a month ago because of neck pain with radiation down to the left shoulder and weakness of the left arm associated with cramping of the left triceps.  This gentleman, about 15 years ago, underwent what he told me was a right 5-6 foraminotomy by Dr. Hope Pigeon but upon review of the chart it turned out it was a right C6-C7 foraminotomy for disk.  He denies any problem with the right arm right now.  He is getting worse, and despite conservative treatment he is not any better.  MRI was not helpful.  We did a myelogram which showed that, indeed, he has a herniated disk at the level of C6-C7 in the left side.  Surgery is being advised.  PAST MEDICAL HISTORY:  Cervical diskectomy from a posterior approach about  15 years ago; he had right shoulder surgery.  ALLERGIES:  He is not allergic to any medication.  SOCIAL HISTORY:  The patient does not smoke and he drinks socially.  FAMILY HISTORY:  A 41 year old father with Alzheimers.  Mother in good condition.  REVIEW OF SYSTEMS:  Positive for sinus headache and neck pain.  PHYSICAL EXAMINATION:  HEENT:  Normal.  NECK:  He is able to flex but extension and lateralization produces pain downgoing to the left shoulder.  LUNGS:  Clear.  HEART:  Heart sounds normal.  ABDOMEN:  Normal.  EXTREMITIES:  Normal pulses.  NEUROLOGIC:  Mental status normal.  Cranial nerves normal.  Strength 5/5 except in the left triceps, which is about 1/5.  He complains of some tingling sensation in the left hand.  Reflexes symmetrical with absent on the  left triceps.  Coordination and gait normal.  LABORATORY DATA:  The myelogram showed that he has a herniated disk at the level C6-C7 central and to the left.  He has spondylosis between 5-6.  CLINICAL IMPRESSION: 1. Left C7 radiculopathy secondary to a herniated disk. 2. Incidental 5-6 spondylosis.  RECOMMENDATION:  The patient wants to go ahead with surgery.  The procedure will be anterior 6-7 diskectomy using bone graft and plate.  He knows about the risks such as infection, CSF leak, worsening of the pain, paralysis, stroke, damage to the vocal cords, damage to the esophagus, need of further surgery. Dictated by:   Tanya Nones. Jeral Fruit, M.D. Attending Physician:  Danella Penton DD:  08/05/01 TD:  08/06/01 Job: [id number]

## 2011-01-02 NOTE — Op Note (Signed)
Ronald Franklin, Ronald Franklin                        ACCOUNT NO.:  [id number]   MEDICAL RECORD NO.:  [id number]                   PATIENT TYPE:  AMB   LOCATION:  DAY                                  FACILITY:  [facility]   PHYSICIAN:  James P. Aplington, M.D.            DATE OF BIRTH:  Nov 30, 1950   DATE OF PROCEDURE:  04/26/2002  DATE OF DISCHARGE:                                 OPERATIVE REPORT   PREOPERATIVE DIAGNOSIS:  Torn medial meniscus, right knee.   POSTOPERATIVE DIAGNOSES:  1. Torn medial meniscus.  2. 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   OPERATION:  Right knee arthroscopy with:  1. Partial medial meniscectomy.  2. Debridement of medial femoral condyle.   SURGEON:  Illene Labrador. Aplington, M.D.   ASSISTANT:  Nurse.   ANESTHESIA:  General.   PATHOLOGY AND JUSTIFICATION FOR PROCEDURE:  Just simply walking on some  stairs, he noted pain in the inner aspect of his knee in mid August.  Because of continued severe pain, I obtained an MRI on April 07, 2002,  which demonstrated a tear of the posterior horn of the mid portion of the  medial meniscus as well as some mild chondromalacia of the patellofemoral  joint.  At surgery, he had an abrasion injury to the anterior third of the  medial meniscus, significant tear of the medial meniscus from just prior to  the posterior curve all the way into the intercondylar area with very little  rim remaining at the posterior curve and grade 2/4 chondromalacia of the  most weightbearing surface of the medial femoral condyle.  Otherwise, his  knee looked unremarkable.   DESCRIPTION OF PROCEDURE:  Satisfactory general anesthesia, pneumatic  tourniquet, thigh stabilizer.  Right knee was prepped with DuraPrep and  draped in a sterile field.  Superior and medial saline inflow.  First  through an anterolateral portal, the medial compartment of the knee joint  was evaluated, minimal synovectomy was performed.  I shaved down  the  anterior third of the weightbearing surface of the medial meniscus and  debrided down the medial femoral condyle.  Postfilms were taken.  Posteriorly, the extensive tear was noted and trimmed back to a stable rim  with small baskets and then shaved down until smooth with the 3.5 shaver.  Final pictures were taken.  I then looked up in the medial gutter and  suprapatellar areas.  There was minimal wear of the patella.  On reversing  portals, the ACL and lateral joint looked normal.  The knee joint was then  irrigated until clear and all fluid possible removed.  The two anterior  portals were closed with 4-0 nylon.  Marcaine 0.5% 20 cc with adrenalin and  4 mg of morphine were then instilled through the inflow apparatus portal  which was removed and this portal closed with 4-0 nylon as  well.  Betadine, Adaptic dry sterile dressing were applied.  Tourniquet was  released.  He tolerated the procedure well.  At the time of this dictation,  he was on his way to the recovery room in satisfactory condition with no  known complications.                                               James P. Aplington, M.D.    JPA/MEDQ  D:  04/26/2002  T:  04/27/2002  Job:  [id number]

## 2011-05-19 LAB — CBC
Hemoglobin: 14.2
MCHC: 33.4
MCV: 94.2
Platelets: 168
RBC: 4.52
RDW: 12.7
WBC: 5.2

## 2011-05-19 LAB — URINALYSIS, ROUTINE W REFLEX MICROSCOPIC
Bilirubin Urine: NEGATIVE
Protein, ur: NEGATIVE
Specific Gravity, Urine: 1.026

## 2011-05-19 LAB — DIFFERENTIAL
Basophils Absolute: 0
Eosinophils Absolute: 0.2
Eosinophils Relative: 3

## 2011-05-19 LAB — BASIC METABOLIC PANEL
CO2: 30
Chloride: 105
Creatinine, Ser: 0.9
GFR calc non Af Amer: >60
Potassium: 5

## 2011-05-19 LAB — TYPE AND SCREEN
ABO/RH(D): O POS
Antibody Screen: NEGATIVE

## 2011-05-19 LAB — ABO/RH: ABO/RH(D): O POS

## 2011-05-19 LAB — APTT: aPTT: 24

## 2011-05-19 LAB — PROTIME-INR: INR: 1

## 2011-05-27 ENCOUNTER — Encounter: Payer: Self-pay | Admitting: *Deleted

## 2011-05-28 ENCOUNTER — Ambulatory Visit (INDEPENDENT_AMBULATORY_CARE_PROVIDER_SITE_OTHER): Payer: Managed Care, Other (non HMO) | Admitting: Internal Medicine

## 2011-05-28 ENCOUNTER — Encounter: Payer: Self-pay | Admitting: Internal Medicine

## 2011-05-28 VITALS — BP 128/72 | HR 69 | Ht 72.0 in | Wt 205.8 lb

## 2011-05-28 DIAGNOSIS — R053 Chronic cough: Secondary | ICD-10-CM

## 2011-05-28 DIAGNOSIS — Z23 Encounter for immunization: Secondary | ICD-10-CM

## 2011-05-28 DIAGNOSIS — R05 Cough: Secondary | ICD-10-CM

## 2011-05-28 DIAGNOSIS — R059 Cough, unspecified: Secondary | ICD-10-CM

## 2011-05-28 NOTE — Patient Instructions (Signed)
Try an otc antihistamine allegra/ fexofenadine  Sample Nasonex nasal steroid spray    2 sprays each nostril once daily at bedtime  Sample Patanase antihistamine nasal spray    1-2 sprays each nostril up to twice daily if needed  If you suspect refluxing stomach juice- try otc omeprazole once daily before breakfast.   Flu vax

## 2011-05-28 NOTE — Progress Notes (Signed)
05/28/11- 60 yoM never smoker self-referred for evaluation of cough. This has been a chronic intermittent issue. He denies any past diagnosis of asthma, reflux or pneumonia. This episode has bothered him for about 6 weeks with no specific trigger or distinct onset. He notices a tickling cough in the back of his throat especially early in the morning when lying down or when watching TV. He doesn't recognize any pattern otherwise. Constant sense of sinus drainage and some nasal congestion but never diagnosed with sinusitis and no history of ENT surgery. He treats himself with over-the-counter antihistamines with some benefit. He does have history of obstructive sleep apnea treated with CPAP, 14 CWP/[employer]. Wearing the CPAP helps nasal congestion. He maintains a regular exercise and vitamin supplement regimen. Past history of C-spine surgery twice with one anterior and one posterior surgical approach. The last of these was 9 years ago so we feel relation to his throat complaint is unlikely. He denies difficulty swallowing, throat pain or hoarseness, chest pain discolored sputum or phlegm.  ROS See HPI Constitutional:   No-   weight loss, night sweats, fevers, chills, fatigue, lassitude. HEENT:   No-  headaches, difficulty swallowing, tooth/dental problems, sore throat,       No-  sneezing, itching, ear ache,     +nasal congestion, post nasal drip,  CV:  No-   chest pain, orthopnea, PND, swelling in lower extremities, anasarca, dizziness, palpitations Resp: No-   shortness of breath with exertion or at rest.              No-   productive cough,  + non-productive cough,  No-  coughing up of blood.              No-   change in color of mucus.  No- wheezing.   Skin: No-   rash or lesions. GI:  No-   heartburn, indigestion, abdominal pain, nausea, vomiting, diarrhea,                 change in bowel habits, loss of appetite GU: No-   dysuria, change in color of urine, no urgency or frequency.  No- flank  pain. MS:  No-   joint pain or swelling.  No- decreased range of motion.  No- back pain. Neuro- grossly normal to observation, Or:  Psych:  No- change in mood or affect. No depression or anxiety.  No memory loss.  OBJ General- Alert, Oriented, Affect-appropriate, Distress- none acute, muscular/fit Skin- rash-none, lesions- none, excoriation- none Lymphadenopathy- none Head- atraumatic            Eyes- Gross vision intact, PERRLA, conjunctivae clear secretions            Ears- Hearing, canals-normal            Nose- Clear, no-Septal dev, mucus, polyps, erosion, perforation             Throat- Mallampati II , mucosa looks somewhat grainy and red , drainage- none, tonsils- atrophic Neck- flexible , trachea midline, no stridor , thyroid nl, carotid no bruit. Old cervical surgery scars. Chest - symmetrical excursion , unlabored           Heart/CV- RRR , no murmur , no gallop  , no rub, nl s1 s2                           - JVD- none , edema- none, stasis changes- none, varices- none  Lung- clear to P&A, wheeze- none, cough- none , dullness-none, rub- none           Chest wall-  Abd- tender-no, distended-no, bowel sounds-present, HSM- no Br/ Gen/ Rectal- Not done, not indicated Extrem- cyanosis- none, clubbing, none, atrophy- none, strength- nl Neuro- grossly intact to observation

## 2011-05-30 DIAGNOSIS — R05 Cough: Secondary | ICD-10-CM | POA: Insufficient documentation

## 2011-05-30 DIAGNOSIS — R053 Chronic cough: Secondary | ICD-10-CM | POA: Insufficient documentation

## 2011-05-30 NOTE — Assessment & Plan Note (Signed)
We discussed the usual differential diagnosis including rhinitis with drainage, reflux, and airway irritability/asthma/bronchitis. I am giving samples of representative medications and suggested that he return if none of these seems helpful.

## 2011-09-09 ENCOUNTER — Other Ambulatory Visit: Payer: Self-pay | Admitting: Neurosurgery

## 2011-09-11 ENCOUNTER — Encounter (HOSPITAL_COMMUNITY): Payer: Self-pay | Admitting: Respiratory Therapy

## 2011-09-15 ENCOUNTER — Encounter (HOSPITAL_COMMUNITY)
Admission: RE | Admit: 2011-09-15 | Discharge: 2011-09-15 | Disposition: A | Discharge status: Home / Self Care | Payer: Managed Care, Other (non HMO) | Source: Ambulatory Visit | Attending: Neurosurgery | Admitting: Neurosurgery

## 2011-09-15 ENCOUNTER — Encounter (HOSPITAL_COMMUNITY): Payer: Self-pay

## 2011-09-15 HISTORY — DX: Unspecified osteoarthritis, unspecified site: M19.90

## 2011-09-15 LAB — CBC
HCT: 43.5 % (ref 39.0–52.0)
Hemoglobin: 14.7 g/dL (ref 13.0–17.0)
MCH: 30.4 pg (ref 26.0–34.0)
MCHC: 33.8 g/dL (ref 30.0–36.0)
MCV: 89.9 fL (ref 78.0–100.0)
RBC: 4.84 MIL/uL (ref 4.22–5.81)
RDW: 12.7 % (ref 11.5–15.5)
WBC: 5.3 10*3/uL (ref 4.0–10.5)

## 2011-09-15 LAB — SURGICAL PCR SCREEN
MRSA, PCR: NEGATIVE
Staphylococcus aureus: POSITIVE — AB

## 2011-09-15 LAB — BASIC METABOLIC PANEL
BUN: 16 mg/dL (ref 6–23)
Calcium: 9.8 mg/dL (ref 8.4–10.5)
Chloride: 102 mEq/L (ref 96–112)
GFR calc non Af Amer: >90 mL/min (ref >90)
Glucose, Bld: 94 mg/dL (ref 70–99)

## 2011-09-15 MED ORDER — CEFAZOLIN SODIUM-DEXTROSE 2-3 GM-% IV SOLR
2.0000 g | INTRAVENOUS | Status: AC
  Administered 2011-09-16: 2 g via INTRAVENOUS
  Filled 2011-09-15: qty 50

## 2011-09-15 NOTE — Pre-Procedure Instructions (Signed)
20 Ronald Franklin  09/15/2011   Your procedure is scheduled on:  09/16/11  Report to [facility] at 740 AM.  Call this number if you have problems the morning of surgery: [phone]   Remember:   Do not eat food:After Midnight.  May have clear liquids: up to 4 Hours before arrival.  Clear liquids include soda, tea, black coffee, apple or grape juice, broth.  Take these medicines the morning of surgery with A SIP OF WATER: none   Do not wear jewelry, make-up or nail polish.  Do not wear lotions, powders, or perfumes. You may wear deodorant.  Do not shave 48 hours prior to surgery.  Do not bring valuables to the hospital.  Contacts, dentures or bridgework may not be worn into surgery.  Leave suitcase in the car. After surgery it may be brought to your room.  For patients admitted to the hospital, checkout time is 11:00 AM the day of discharge.   Patients discharged the day of surgery will not be allowed to drive home.  Name and phone number of your driver: family  Special Instructions: CHG Shower Use Special Wash: 1/2 bottle night before surgery and 1/2 bottle morning of surgery.   Please read over the following fact sheets that you were given: Pain Booklet, MRSA Information and Surgical Site Infection Prevention

## 2011-09-15 NOTE — Pre-Procedure Instructions (Signed)
20 Ronald Franklin  09/15/2011   Your procedure is scheduled on:  09/16/11  Report to [facility] at 740 AM.  Call this number if you have problems the morning of surgery: [phone]   Remember:   Do not eat food:After Midnight.  May have clear liquids: up to 4 Hours before arrival.  Clear liquids include soda, tea, black coffee, apple or grape juice, broth.  Take these medicines the morning of surgery with A SIP OF WATER: none   Do not wear jewelry, make-up or nail polish.  Do not wear lotions, powders, or perfumes. You may wear deodorant.  Do not shave 48 hours prior to surgery.  Do not bring valuables to the hospital.  Contacts, dentures or bridgework may not be worn into surgery.  Leave suitcase in the car. After surgery it may be brought to your room.  For patients admitted to the hospital, checkout time is 11:00 AM the day of discharge.   Patients discharged the day of surgery will not be allowed to drive home.  Name and phone number of your driver: wife  Special Instructions: CHG Shower Use Special Wash: 1/2 bottle night before surgery and 1/2 bottle morning of surgery.   Please read over the following fact sheets that you were given: Pain Booklet, MRSA Information and Surgical Site Infection Prevention

## 2011-09-16 ENCOUNTER — Encounter (HOSPITAL_COMMUNITY): Payer: Self-pay | Admitting: Anesthesiology

## 2011-09-16 ENCOUNTER — Encounter (HOSPITAL_COMMUNITY): Payer: Self-pay

## 2011-09-16 ENCOUNTER — Ambulatory Visit (HOSPITAL_COMMUNITY): Payer: Managed Care, Other (non HMO) | Admitting: Anesthesiology

## 2011-09-16 ENCOUNTER — Ambulatory Visit (HOSPITAL_COMMUNITY)
Admission: RE | Admit: 2011-09-16 | Discharge: 2011-09-16 | Disposition: A | Discharge status: Home / Self Care | Payer: Managed Care, Other (non HMO) | Source: Ambulatory Visit | Attending: Neurosurgery | Admitting: Neurosurgery

## 2011-09-16 ENCOUNTER — Encounter (HOSPITAL_COMMUNITY)
Admission: RE | Disposition: A | Discharge status: Home / Self Care | Payer: Self-pay | Source: Ambulatory Visit | Attending: Neurosurgery

## 2011-09-16 DIAGNOSIS — M129 Arthropathy, unspecified: Secondary | ICD-10-CM | POA: Insufficient documentation

## 2011-09-16 DIAGNOSIS — Z79899 Other long term (current) drug therapy: Secondary | ICD-10-CM | POA: Insufficient documentation

## 2011-09-16 DIAGNOSIS — Z01812 Encounter for preprocedural laboratory examination: Secondary | ICD-10-CM | POA: Insufficient documentation

## 2011-09-16 DIAGNOSIS — G4733 Obstructive sleep apnea (adult) (pediatric): Secondary | ICD-10-CM | POA: Insufficient documentation

## 2011-09-16 DIAGNOSIS — G5600 Carpal tunnel syndrome, unspecified upper limb: Secondary | ICD-10-CM | POA: Insufficient documentation

## 2011-09-16 DIAGNOSIS — Z7982 Long term (current) use of aspirin: Secondary | ICD-10-CM | POA: Insufficient documentation

## 2011-09-16 HISTORY — PX: CARPAL TUNNEL RELEASE: SHX101

## 2011-09-16 SURGERY — CARPAL TUNNEL RELEASE
Anesthesia: Monitor Anesthesia Care | Laterality: Left | Wound class: Clean

## 2011-09-16 MED ORDER — LACTATED RINGERS IV SOLN
INTRAVENOUS | Status: DC | PRN
  Administered 2011-09-16: 08:00:00 via INTRAVENOUS

## 2011-09-16 MED ORDER — PROPOFOL 10 MG/ML IV EMUL
INTRAVENOUS | Status: DC | PRN
  Administered 2011-09-16: 50 mg via INTRAVENOUS

## 2011-09-16 MED ORDER — MIDAZOLAM HCL 5 MG/5ML IJ SOLN
INTRAMUSCULAR | Status: DC | PRN
  Administered 2011-09-16: 2 mg via INTRAVENOUS

## 2011-09-16 MED ORDER — HYDROMORPHONE HCL PF 1 MG/ML IJ SOLN: 0.2500 mg | INTRAMUSCULAR | Status: DC | PRN

## 2011-09-16 MED ORDER — FENTANYL CITRATE 0.05 MG/ML IJ SOLN
INTRAMUSCULAR | Status: DC | PRN
  Administered 2011-09-16: 100 ug via INTRAVENOUS
  Administered 2011-09-16: 50 ug via INTRAVENOUS

## 2011-09-16 MED ORDER — BUPIVACAINE-EPINEPHRINE 0.5% -1:200000 IJ SOLN
INTRAMUSCULAR | Status: DC | PRN
  Administered 2011-09-16: 14 mL

## 2011-09-16 MED ORDER — MORPHINE SULFATE 2 MG/ML IJ SOLN: 2.0000 mg | INTRAMUSCULAR | Status: DC | PRN

## 2011-09-16 MED ORDER — ONDANSETRON HCL 4 MG/2ML IJ SOLN: 4.0000 mg | Freq: Four times a day (QID) | INTRAMUSCULAR | Status: DC | PRN

## 2011-09-16 SURGICAL SUPPLY — 48 items
BANDAGE GAUZE ELAST BULKY 4 IN (GAUZE/BANDAGES/DRESSINGS) ×2 IMPLANT
BLADE SURG 15 STRL LF DISP TIS (BLADE) ×1 IMPLANT
BLADE SURG 15 STRL SS (BLADE) ×2
CLOTH BEACON ORANGE TIMEOUT ST (SAFETY) ×2 IMPLANT
CORDS BIPOLAR (ELECTRODE) ×2 IMPLANT
DECANTER SPIKE VIAL GLASS SM (MISCELLANEOUS) ×2 IMPLANT
DRAPE EXTREMITY T 121X128X90 (DRAPE) ×2 IMPLANT
DURAPREP 26ML APPLICATOR (WOUND CARE) ×2 IMPLANT
GAUZE SPONGE 4X4 16PLY XRAY LF (GAUZE/BANDAGES/DRESSINGS) ×2 IMPLANT
GLOVE BIO SURGEON STRL SZ 6.5 (GLOVE) IMPLANT
GLOVE BIO SURGEON STRL SZ7 (GLOVE) IMPLANT
GLOVE BIO SURGEON STRL SZ7.5 (GLOVE) IMPLANT
GLOVE BIO SURGEON STRL SZ8 (GLOVE) IMPLANT
GLOVE BIO SURGEON STRL SZ8.5 (GLOVE) IMPLANT
GLOVE BIOGEL M 8.0 STRL (GLOVE) ×2 IMPLANT
GLOVE BIOGEL PI IND STRL 7.0 (GLOVE) ×1 IMPLANT
GLOVE BIOGEL PI INDICATOR 7.0 (GLOVE) ×1
GLOVE ECLIPSE 6.5 STRL STRAW (GLOVE) IMPLANT
GLOVE ECLIPSE 7.0 STRL STRAW (GLOVE) IMPLANT
GLOVE ECLIPSE 7.5 STRL STRAW (GLOVE) IMPLANT
GLOVE ECLIPSE 8.0 STRL XLNG CF (GLOVE) IMPLANT
GLOVE ECLIPSE 8.5 STRL (GLOVE) IMPLANT
GLOVE EXAM NITRILE LRG STRL (GLOVE) IMPLANT
GLOVE EXAM NITRILE MD LF STRL (GLOVE) IMPLANT
GLOVE EXAM NITRILE XL STR (GLOVE) IMPLANT
GLOVE EXAM NITRILE XS STR PU (GLOVE) IMPLANT
GLOVE INDICATOR 6.5 STRL GRN (GLOVE) IMPLANT
GLOVE INDICATOR 7.0 STRL GRN (GLOVE) IMPLANT
GLOVE INDICATOR 7.5 STRL GRN (GLOVE) IMPLANT
GLOVE INDICATOR 8.0 STRL GRN (GLOVE) IMPLANT
GLOVE INDICATOR 8.5 STRL (GLOVE) IMPLANT
GLOVE OPTIFIT SS 8.0 STRL (GLOVE) IMPLANT
GLOVE SURG SS PI 6.5 STRL IVOR (GLOVE) ×2 IMPLANT
GOWN BRE IMP SLV AUR LG STRL (GOWN DISPOSABLE) ×4 IMPLANT
GOWN BRE IMP SLV AUR XL STRL (GOWN DISPOSABLE) IMPLANT
GOWN STRL REIN 2XL LVL4 (GOWN DISPOSABLE) IMPLANT
KIT BASIN OR (CUSTOM PROCEDURE TRAY) ×2 IMPLANT
KIT ROOM TURNOVER OR (KITS) ×2 IMPLANT
NEEDLE HYPO 25X1 1.5 SAFETY (NEEDLE) ×2 IMPLANT
NS IRRIG 1000ML POUR BTL (IV SOLUTION) ×2 IMPLANT
PACK SURGICAL SETUP 50X90 (CUSTOM PROCEDURE TRAY) ×2 IMPLANT
PAD ARMBOARD 7.5X6 YLW CONV (MISCELLANEOUS) ×2 IMPLANT
SPONGE GAUZE 4X4 12PLY (GAUZE/BANDAGES/DRESSINGS) ×2 IMPLANT
SUT ETHILON 3 0 PS 1 (SUTURE) ×2 IMPLANT
SYR BULB 3OZ (MISCELLANEOUS) ×2 IMPLANT
SYR CONTROL 10ML LL (SYRINGE) ×2 IMPLANT
TOWEL OR 17X24 6PK STRL BLUE (TOWEL DISPOSABLE) ×4 IMPLANT
WATER STERILE IRR 1000ML POUR (IV SOLUTION) ×2 IMPLANT

## 2011-09-16 NOTE — Op Note (Signed)
NAME:  Ronald Franklin, Ronald Franklin NO.:  [id number]  MEDICAL RECORD NO.:  [id number]  LOCATION:  [facility]                         FACILITY:  [facility]  PHYSICIAN:  Hilda Lias, M.D.   DATE OF BIRTH:  02-19-1951  DATE OF PROCEDURE:  09/16/2011 DATE OF DISCHARGE:                              OPERATIVE REPORT   __________  At the end, we had good decompression of the median nerve was __________ of pain at the beginning and became __________ and the wound was closed with a single layer of __________.          ______________________________ Hilda Lias, M.D.  THE PROCEDURE WAS FULLY RE-DICTATED,THANKS eboterpo   EB/MEDQ  D:  09/16/2011  T:  09/16/2011  Job:  [id number]

## 2011-09-16 NOTE — Transfer of Care (Signed)
Immediate Anesthesia Transfer of Care Note  Patient: Ronald Franklin  Procedure(s) Performed:  CARPAL TUNNEL RELEASE - Left Carpal Tunnel Release  Patient Location: PACU  Anesthesia Type: MAC  Level of Consciousness: awake, alert , oriented and sedated  Airway & Oxygen Therapy: Patient Spontanous Breathing  Post-op Assessment: Report given to PACU RN, Post -op Vital signs reviewed and stable and Patient moving all extremities  Post vital signs: Reviewed and stable  Complications: No apparent anesthesia complications

## 2011-09-16 NOTE — Preoperative (Signed)
Beta Blockers   Reason not to administer Beta Blockers:Not Applicable 

## 2011-09-16 NOTE — Anesthesia Preprocedure Evaluation (Signed)
Anesthesia Evaluation  Patient identified by MRN, date of birth, ID band Patient awake    Reviewed: Allergy & Precautions, H&P , NPO status , Patient's Chart, lab work & pertinent test results  Airway Mallampati: II  Neck ROM: full    Dental   Pulmonary sleep apnea ,          Cardiovascular     Neuro/Psych  Neuromuscular disease    GI/Hepatic   Endo/Other    Renal/GU      Musculoskeletal  (+) Arthritis -,   Abdominal   Peds  Hematology   Anesthesia Other Findings   Reproductive/Obstetrics                           Anesthesia Physical Anesthesia Plan  ASA: II  Anesthesia Plan: MAC   Post-op Pain Management:    Induction: Intravenous  Airway Management Planned: Simple Face Mask  Additional Equipment:   Intra-op Plan:   Post-operative Plan:   Informed Consent: I have reviewed the patients History and Physical, chart, labs and discussed the procedure including the risks, benefits and alternatives for the proposed anesthesia with the patient or authorized representative who has indicated his/her understanding and acceptance.     Plan Discussed with: CRNA and Surgeon  Anesthesia Plan Comments:         Anesthesia Quick Evaluation

## 2011-09-16 NOTE — Anesthesia Postprocedure Evaluation (Signed)
Anesthesia Post Note  Patient: Ronald Franklin  Procedure(s) Performed:  CARPAL TUNNEL RELEASE - Left Carpal Tunnel Release  Anesthesia type: MAC  Patient location: PACU  Post pain: Pain level controlled and Adequate analgesia  Post assessment: Post-op Vital signs reviewed, Patient's Cardiovascular Status Stable and Respiratory Function Stable  Last Vitals:  Filed Vitals:   09/16/11 0930  BP: 117/69  Pulse: 61  Temp:   Resp: 16    Post vital signs: Reviewed and stable  Level of consciousness: awake, alert  and oriented  Complications: No apparent anesthesia complications

## 2011-09-16 NOTE — H&P (Signed)
Ronald Franklin is an 61 y.o. male.   Chief Complaint: pain left hand HPI: pain and weakness in left hand associated with numbness  Past Medical History  Diagnosis Date  . OSA (obstructive sleep apnea)   . Allergic rhinitis   . CTS (carpal tunnel syndrome)   . Arthritis     Past Surgical History  Procedure Date  . Cervical laminectomy   . Rotator cuff repair     bilateral   . Neck surgery   . Knee surgery   . Back surgery     C4-5-6   2 cervical    from AA    Family History  Problem Relation Age of Onset  . Hyperlipidemia    . Prostate cancer     Social History:  reports that he has never smoked. He does not have any smokeless tobacco history on file. He reports that he drinks about 3 ounces of alcohol per week. He reports that he does not use illicit drugs.  Allergies: No Known Allergies  Medications Prior to Admission  Medication Dose Route Frequency Provider Last Rate Last Dose  . ceFAZolin (ANCEF) IVPB 2 g/50 mL premix  2 g Intravenous 60 min Pre-Op Karn Cassis, MD      . HYDROmorphone (DILAUDID) injection 0.25-0.5 mg  0.25-0.5 mg Intravenous Q5 min PRN Raiford Simmonds, MD      . ondansetron (ZOFRAN) injection 4 mg  4 mg Intravenous Q6H PRN Raiford Simmonds, MD       Medications Prior to Admission  Medication Sig Dispense Refill  . Ascorbic Acid (VITAMIN C) 1000 MG tablet Take 1,000 mg by mouth daily.        Marland Kitchen aspirin 81 MG tablet Take 81 mg by mouth daily.        . cholecalciferol (VITAMIN D) 1000 UNITS tablet Take 1,000 Units by mouth daily.        Marland Kitchen glucosamine-chondroitin 500-400 MG tablet Take 1 tablet by mouth daily.       . Multiple Vitamins-Minerals (CENTRUM SILVER PO) Take 1 tablet by mouth daily.        . pseudoephedrine (SUDAFED 12 HOUR) 120 MG 12 hr tablet Take 120 mg by mouth every 12 (twelve) hours as needed. For congestion      . vitamin E 800 UNIT capsule Take 800 Units by mouth daily.          Results for orders placed during the hospital  encounter of 09/15/11 (from the past 48 hour(s))  CBC     Status: Normal   Collection Time   09/15/11  9:44 AM      Component Value Range Comment   WBC 5.3  4.0 - 10.5 (K/uL)    RBC 4.84  4.22 - 5.81 (MIL/uL)    Hemoglobin 14.7  13.0 - 17.0 (g/dL)    HCT 78.2  95.6 - 21.3 (%)    MCV 89.9  78.0 - 100.0 (fL)    MCH 30.4  26.0 - 34.0 (pg)    MCHC 33.8  30.0 - 36.0 (g/dL)    RDW 08.6  57.8 - 46.9 (%)    Platelets 152  150 - 400 (K/uL)   BASIC METABOLIC PANEL     Status: Normal   Collection Time   09/15/11  9:44 AM      Component Value Range Comment   Sodium 137  135 - 145 (mEq/L)    Potassium 4.2  3.5 - 5.1 (mEq/L)    Chloride  102  96 - 112 (mEq/L)    CO2 27  19 - 32 (mEq/L)    Glucose, Bld 94  70 - 99 (mg/dL)    BUN 16  6 - 23 (mg/dL)    Creatinine, Ser 4.78  0.50 - 1.35 (mg/dL)    Calcium 9.8  8.4 - 10.5 (mg/dL)    GFR calc non Af Amer >90  >90 (mL/min)    GFR calc Af Amer >90  >90 (mL/min)   SURGICAL PCR SCREEN     Status: Abnormal   Collection Time   09/15/11  9:44 AM      Component Value Range Comment   MRSA, PCR NEGATIVE  NEGATIVE     Staphylococcus aureus POSITIVE (*) NEGATIVE     No results found.  Review of Systems  Constitutional: Negative.   HENT: Negative.   Eyes: Negative.   Respiratory: Negative.   Cardiovascular: Negative.   Gastrointestinal: Negative.   Genitourinary: Negative.   Musculoskeletal: Negative.   Skin: Negative.   Neurological: Negative.   Endo/Heme/Allergies: Negative.   Psychiatric/Behavioral: Negative.     Blood pressure 117/73, pulse 64, temperature 98 F (36.7 C), temperature source Oral, resp. rate 18, SpO2 99.00%. Physical Exam neck, anterior scar. Lungs,clear. Cv, nl. Abdomen nl ext, nl Neuro TINNEL SIGN POSITIVE LEFT HAND. WEAKNESS THENAR MUSCLES  Assessment/PlanDECOMPRESSION OF LEFT MEDIAN NERVE  Ronald Franklin 09/16/2011, 8:27 AM

## 2011-09-16 NOTE — Progress Notes (Signed)
Decompression of left median nerve done. Op note [phone]

## 2011-09-17 ENCOUNTER — Other Ambulatory Visit (INDEPENDENT_AMBULATORY_CARE_PROVIDER_SITE_OTHER): Payer: Managed Care, Other (non HMO)

## 2011-09-17 ENCOUNTER — Encounter: Payer: Self-pay | Admitting: Internal Medicine

## 2011-09-17 ENCOUNTER — Ambulatory Visit (INDEPENDENT_AMBULATORY_CARE_PROVIDER_SITE_OTHER): Payer: Managed Care, Other (non HMO) | Admitting: Internal Medicine

## 2011-09-17 VITALS — BP 130/68 | HR 68 | Temp 97.0°F | Resp 16 | Wt 211.0 lb

## 2011-09-17 DIAGNOSIS — G3184 Mild cognitive impairment, so stated: Secondary | ICD-10-CM

## 2011-09-17 DIAGNOSIS — Z0000 Encounter for general adult medical examination without abnormal findings: Secondary | ICD-10-CM

## 2011-09-17 DIAGNOSIS — Z0001 Encounter for general adult medical examination with abnormal findings: Secondary | ICD-10-CM | POA: Insufficient documentation

## 2011-09-17 DIAGNOSIS — Z1211 Encounter for screening for malignant neoplasm of colon: Secondary | ICD-10-CM | POA: Insufficient documentation

## 2011-09-17 LAB — URINALYSIS, ROUTINE W REFLEX MICROSCOPIC
Ketones, ur: NEGATIVE
Leukocytes, UA: NEGATIVE
Specific Gravity, Urine: 1.02 (ref 1.000–1.030)
Urine Glucose: NEGATIVE
pH: 6 (ref 5.0–8.0)

## 2011-09-17 LAB — COMPREHENSIVE METABOLIC PANEL
ALT: 32 U/L (ref 0–53)
Alkaline Phosphatase: 71 U/L (ref 39–117)
Sodium: 138 mEq/L (ref 135–145)
Total Bilirubin: 0.4 mg/dL (ref 0.3–1.2)
Total Protein: 7.1 g/dL (ref 6.0–8.3)

## 2011-09-17 LAB — CBC WITH DIFFERENTIAL/PLATELET
Basophils Absolute: 0 10*3/uL (ref 0.0–0.1)
HCT: 43.3 % (ref 39.0–52.0)
Lymphs Abs: 2 10*3/uL (ref 0.7–4.0)
MCV: 91.8 fl (ref 78.0–100.0)
Monocytes Absolute: 0.6 10*3/uL (ref 0.1–1.0)
Monocytes Relative: 8.5 % (ref 3.0–12.0)
Platelets: 185 10*3/uL (ref 150.0–400.0)
RDW: 12.8 % (ref 11.5–14.6)

## 2011-09-17 LAB — LIPID PANEL
Cholesterol: 221 mg/dL — ABNORMAL HIGH (ref 0–200)
Total CHOL/HDL Ratio: 6
Triglycerides: 154 mg/dL — ABNORMAL HIGH (ref 0.0–149.0)

## 2011-09-17 MED ORDER — PHOSPHATIDYLSERINE-DHA-EPA 100-19.5-6.5 MG PO CAPS: 1.0000 | ORAL_CAPSULE | Freq: Every day | ORAL | Status: DC

## 2011-09-17 NOTE — Assessment & Plan Note (Signed)
Start vayacog

## 2011-09-17 NOTE — Patient Instructions (Signed)
Health Maintenance, Males A healthy lifestyle and preventative care can promote health and wellness.  Maintain regular health, dental, and eye exams.   Eat a healthy diet. Foods like vegetables, fruits, whole grains, low-fat dairy products, and lean protein foods contain the nutrients you need without too many calories. Decrease your intake of foods high in solid fats, added sugars, and salt. Get information about a proper diet from your caregiver, if necessary.   Regular physical exercise is one of the most important things you can do for your health. Most adults should get at least 150 minutes of moderate-intensity exercise (any activity that increases your heart rate and causes you to sweat) each week. In addition, most adults need muscle-strengthening exercises on 2 or more days a week.    Maintain a healthy weight. The body mass index (BMI) is a screening tool to identify possible weight problems. It provides an estimate of body fat based on height and weight. Your caregiver can help determine your BMI, and can help you achieve or maintain a healthy weight. For adults 20 years and older:   A BMI below 18.5 is considered underweight.   A BMI of 18.5 to 24.9 is normal.   A BMI of 25 to 29.9 is considered overweight.   A BMI of 30 and above is considered obese.   Maintain normal blood lipids and cholesterol by exercising and minimizing your intake of saturated fat. Eat a balanced diet with plenty of fruits and vegetables. Blood tests for lipids and cholesterol should begin at age 20 and be repeated every 5 years. If your lipid or cholesterol levels are high, you are over 50, or you are a high risk for heart disease, you may need your cholesterol levels checked more frequently.Ongoing high lipid and cholesterol levels should be treated with medicines, if diet and exercise are not effective.   If you smoke, find out from your caregiver how to quit. If you do not use tobacco, do not start.    If you choose to drink alcohol, do not exceed 2 drinks per day. One drink is considered to be 12 ounces (355 mL) of beer, 5 ounces (148 mL) of wine, or 1.5 ounces (44 mL) of liquor.   Avoid use of street drugs. Do not share needles with anyone. Ask for help if you need support or instructions about stopping the use of drugs.   High blood pressure causes heart disease and increases the risk of stroke. Blood pressure should be checked at least every 1 to 2 years. Ongoing high blood pressure should be treated with medicines if weight loss and exercise are not effective.   If you are 45 to 62 years old, ask your caregiver if you should take aspirin to prevent heart disease.   Diabetes screening involves taking a blood sample to check your fasting blood sugar level. This should be done once every 3 years, after age 45, if you are within normal weight and without risk factors for diabetes. Testing should be considered at a younger age or be carried out more frequently if you are overweight and have at least 1 risk factor for diabetes.   Colorectal cancer can be detected and often prevented. Most routine colorectal cancer screening begins at the age of 50 and continues through age 75. However, your caregiver may recommend screening at an earlier age if you have risk factors for colon cancer. On a yearly basis, your caregiver may provide home test kits to check for hidden   blood in the stool. Use of a small camera at the end of a tube, to directly examine the colon (sigmoidoscopy or colonoscopy), can detect the earliest forms of colorectal cancer. Talk to your caregiver about this at age 50, when routine screening begins. Direct examination of the colon should be repeated every 5 to 10 years through age 75, unless early forms of pre-cancerous polyps or small growths are found.   Healthy men should no longer receive prostate-specific antigen (PSA) blood tests as part of routine cancer screening. Consult with  your caregiver about prostate cancer screening.   Practice safe sex. Use condoms and avoid high-risk sexual practices to reduce the spread of sexually transmitted infections (STIs).   Use sunscreen with a sun protection factor (SPF) of 30 or greater. Apply sunscreen liberally and repeatedly throughout the day. You should seek shade when your shadow is shorter than you. Protect yourself by wearing long sleeves, pants, a wide-brimmed hat, and sunglasses year round, whenever you are outdoors.   Notify your caregiver of new moles or changes in moles, especially if there is a change in shape or color. Also notify your caregiver if a mole is larger than the size of a pencil eraser.   A one-time screening for abdominal aortic aneurysm (AAA) and surgical repair of large AAAs by sound wave imaging (ultrasonography) is recommended for ages 65 to 75 years who are current or former smokers.   Stay current with your immunizations.  Document Released: 01/30/2008 Document Revised: 04/15/2011 Document Reviewed: 12/29/2010 [employer] Patient Information 2012 [employer]. 

## 2011-09-17 NOTE — Progress Notes (Signed)
Subjective:    Patient ID: Ronald Franklin, male    DOB: 03-Mar-1951, 61 y.o.   MRN: [id number]  HPI  He returns for a physical and complains of occasional episodes of forgetfulness (why did I enter this room, where are my keys, what is his name?).  Review of Systems  Constitutional: Negative.   HENT: Negative.   Eyes: Negative.   Respiratory: Negative for apnea, cough, choking, chest tightness, shortness of breath, wheezing and stridor.   Cardiovascular: Negative for chest pain, palpitations and leg swelling.  Gastrointestinal: Negative.   Genitourinary: Negative for dysuria, urgency, frequency, hematuria, flank pain, decreased urine volume, discharge, penile swelling, scrotal swelling, enuresis, difficulty urinating, genital sores, penile pain and testicular pain.  Musculoskeletal: Negative.   Skin: Negative.   Neurological: Negative.   Hematological: Negative for adenopathy. Does not bruise/bleed easily.  Psychiatric/Behavioral: Positive for decreased concentration. Negative for suicidal ideas, hallucinations, behavioral problems, confusion, sleep disturbance, self-injury, dysphoric mood and agitation. The patient is not nervous/anxious and is not hyperactive.        Objective:   Physical Exam  Vitals reviewed. Constitutional: He is oriented to person, place, and time. He appears well-developed and well-nourished. No distress.  HENT:  Head: Normocephalic and atraumatic.  Mouth/Throat: Oropharynx is clear and moist. No oropharyngeal exudate.  Eyes: Conjunctivae are normal. Right eye exhibits no discharge. Left eye exhibits no discharge. No scleral icterus.  Neck: Normal range of motion. Neck supple. No JVD present. No tracheal deviation present. No thyromegaly present.  Cardiovascular: Normal rate, regular rhythm, normal heart sounds and intact distal pulses.  Exam reveals no gallop and no friction rub.   No murmur heard. Pulmonary/Chest: Effort normal and breath sounds normal. No  stridor. No respiratory distress. He has no wheezes. He has no rales. He exhibits no tenderness.  Abdominal: Soft. Bowel sounds are normal. He exhibits no distension and no mass. There is no tenderness. There is no rebound and no guarding. Hernia confirmed negative in the right inguinal area and confirmed negative in the left inguinal area.  Genitourinary: Rectum normal, prostate normal, testes normal and penis normal. Rectal exam shows no external hemorrhoid, no internal hemorrhoid, no fissure, no mass, no tenderness and anal tone normal. Guaiac negative stool. Prostate is not enlarged and not tender. Right testis shows no mass, no swelling and no tenderness. Right testis is descended. Left testis shows no mass, no swelling and no tenderness. Left testis is descended. Circumcised. No penile tenderness. No discharge found.  Musculoskeletal: Normal range of motion. He exhibits no edema and no tenderness.       Left wrist: He exhibits deformity (dressing/splint).  Lymphadenopathy:    He has no cervical adenopathy.       Right: No inguinal adenopathy present.       Left: No inguinal adenopathy present.  Neurological: He is oriented to person, place, and time.  Skin: Skin is warm and dry. No rash noted. He is not diaphoretic. No erythema. No pallor.  Psychiatric: He has a normal mood and affect. His behavior is normal. Judgment and thought content normal.     Lab Results  Component Value Date   WBC 5.3 09/15/2011   HGB 14.7 09/15/2011   HCT 43.5 09/15/2011   PLT 152 09/15/2011   GLUCOSE 94 09/15/2011   CHOL 234* 09/15/2010   TRIG 72.0 09/15/2010   HDL 50.70 09/15/2010   LDLDIRECT 172.8 09/15/2010   ALT 26 09/15/2010   AST 22 09/15/2010   NA 137 09/15/2011  K 4.2 09/15/2011   CL 102 09/15/2011   CREATININE 0.86 09/15/2011   BUN 16 09/15/2011   CO2 27 09/15/2011   TSH 1.63 09/15/2010   PSA 1.37 09/15/2010   INR 1.0 07/02/2008       Assessment & Plan:

## 2011-09-17 NOTE — Assessment & Plan Note (Signed)
Exam done, labs ordered, pt ed material was given 

## 2011-09-18 ENCOUNTER — Encounter (HOSPITAL_COMMUNITY): Payer: Self-pay | Admitting: Neurosurgery

## 2011-09-18 LAB — LDL CHOLESTEROL, DIRECT: Direct LDL: 157.7 mg/dL

## 2011-09-18 NOTE — Progress Notes (Signed)
Patient ID: Ronald Franklin, male   DOB: [birth year], 61 y.o.   MRN: [id number] New op note dictated. Number [id number]

## 2011-09-19 NOTE — Op Note (Signed)
NAME:  DAMIN, SALIDO NO.:  [id number]  MEDICAL RECORD NO.:  [id number]  LOCATION:  [facility]                         FACILITY:  [facility]  PHYSICIAN:  Hilda Lias, M.D.   DATE OF BIRTH:  [birth year]  DATE OF PROCEDURE:  09/18/2011 DATE OF DISCHARGE:  09/16/2011                              OPERATIVE REPORT   PREOPERATIVE DIAGNOSIS:  Left carpal tunnel syndrome.  POSTOPERATIVE DIAGNOSIS:  Left carpal tunnel syndrome.  PROCEDURE:  Decompression of the left median nerve.  CLINICAL HISTORY:  Mr. Rog is a gentleman who in the past underwent anterior cervical fusion.  Now, he had been complaining for pain and numbness in both hands.  The patient clinically and by diagnostic showed that he has a bilateral carpal tunnel syndrome.  He want to have decompression of the left median nerve first.  PROCEDURE:  The patient was taken to the OR.  IV sedation was given. The left hand and left arm was cleaned with DuraPrep.  Drapes were applied.  Infiltration with Xylocaine along the base of the thumb was made.  Incision followed the base of the thumb was made through the skin, to the volar ligament and through a thick calcified ligament. Decompression proximal and distal was achieved.  Part of the ligament to prevent further compromise was resected.  The ligament was quite thick and calcified.  Hemostasis was done with bipolar.  The area was irrigated and the wound was closed with nylon.  The patient did well. He is going to be discharged from PACU once he is stable.          ______________________________ Hilda Lias, M.D.     EB/MEDQ  D:  09/18/2011  T:  09/19/2011  Job:  [id number]

## 2011-09-20 ENCOUNTER — Encounter: Payer: Self-pay | Admitting: Internal Medicine

## 2011-10-06 NOTE — Progress Notes (Signed)
Patient ID: Ronald Franklin, male   DOB: 1951/01/02, 61 y.o.   MRN: [id number] A new operative report was dictated because too many blanks in the original report.

## 2011-10-19 ENCOUNTER — Telehealth: Payer: Self-pay | Admitting: Internal Medicine

## 2011-10-19 DIAGNOSIS — G3184 Mild cognitive impairment, so stated: Secondary | ICD-10-CM

## 2011-10-19 MED ORDER — PHOSPHATIDYLSERINE-DHA-EPA 100-19.5-6.5 MG PO CAPS: 1.0000 | ORAL_CAPSULE | Freq: Every day | ORAL | Status: DC

## 2011-10-19 NOTE — Telephone Encounter (Signed)
[facility] calling stating they are not familiar with    Phosphatidylserine-DHA-EPA  Can we give them a brand name or more info as to what is needing to be filled.

## 2011-10-19 NOTE — Telephone Encounter (Signed)
The pt called and stated the samples of Vayacog that he was given were helping.  He is hoping to get an rx called into the [facility].  Thanks!

## 2011-10-19 NOTE — Telephone Encounter (Signed)
done

## 2011-10-20 NOTE — Telephone Encounter (Signed)
Is this Vayarin or Vayacog? Please clarin and print written script to be fax to pharmacy Thanks

## 2011-10-21 ENCOUNTER — Telehealth: Payer: Self-pay

## 2011-10-21 MED ORDER — PHOSPHATIDYLSERINE-DHA-EPA 100-19.5-6.5 MG PO CAPS: 1.0000 | ORAL_CAPSULE | Freq: Every day | ORAL | Status: DC

## 2011-10-21 NOTE — Telephone Encounter (Signed)
RX faxed to pharmacy.

## 2011-10-21 NOTE — Telephone Encounter (Signed)
Ronald Franklin

## 2011-10-21 NOTE — Telephone Encounter (Signed)
The pt called and is hoping to get an rx of sample med he was given.  He stated it was something like "vyacog".  Not sure on the spelling.  Thanks!

## 2011-10-26 NOTE — Telephone Encounter (Signed)
Rx printed and faxed to pharmacy. 

## 2011-10-27 ENCOUNTER — Telehealth: Payer: Self-pay | Admitting: Internal Medicine

## 2011-10-27 NOTE — Telephone Encounter (Signed)
Duplicate, rx faxed to pharmacy. See prior phone note

## 2011-10-27 NOTE — Telephone Encounter (Signed)
Pt requesting vayacog--pt was given samples and the med works--Pharm [facility] ph# [phone]/HOME---CELL/ [phone]

## 2012-05-04 ENCOUNTER — Encounter: Payer: Self-pay | Admitting: Internal Medicine

## 2012-05-04 ENCOUNTER — Telehealth: Payer: Self-pay | Admitting: Internal Medicine

## 2012-05-04 ENCOUNTER — Ambulatory Visit (INDEPENDENT_AMBULATORY_CARE_PROVIDER_SITE_OTHER): Payer: Managed Care, Other (non HMO) | Admitting: Internal Medicine

## 2012-05-04 VITALS — BP 138/82 | HR 64 | Temp 98.2°F | Resp 16 | Wt 205.0 lb

## 2012-05-04 DIAGNOSIS — S0993XA Unspecified injury of face, initial encounter: Secondary | ICD-10-CM

## 2012-05-04 DIAGNOSIS — N390 Urinary tract infection, site not specified: Secondary | ICD-10-CM | POA: Insufficient documentation

## 2012-05-04 DIAGNOSIS — S0991XA Unspecified injury of ear, initial encounter: Secondary | ICD-10-CM

## 2012-05-04 DIAGNOSIS — S199XXA Unspecified injury of neck, initial encounter: Secondary | ICD-10-CM

## 2012-05-04 LAB — POCT URINALYSIS DIPSTICK
Ketones, UA: NEGATIVE
Leukocytes, UA: NEGATIVE
Protein, UA: NEGATIVE
pH, UA: 5

## 2012-05-04 NOTE — Progress Notes (Signed)
Subjective:     Patient ID: Ronald Franklin, male   DOB: [birth year], 61 y.o.   MRN: [id number]  HPI Comments: Ronald Franklin is a 61 yo male who has come to clinic today with bloody discharge from his right ear. The discharge was brought to his attention yesterday by his wife after he finished mowing his lawn. He states that it was bleeding dark blood and he used a Q-tip to clean his ear out and pulled out small clots. He estimates the amount of blood to be less than a teaspoon. He cleans his ears with Q-tips regularly and last did this two days ago. He does not pick his ears with his fingers. He states that when he first noticed the discharge he did not have any pain, but he now notes 1/10 dull pain in his right ear. He states that he has a high pain threshold. He reports that he has had no falls or trauma in the past month. He also reports tinnitus in his right ear. He has no had any headache, facial droop, loss of balance or vertigo. 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 He also did target shooting in the past. He has not seen an ENT for this problem.  Past Medical History  Diagnosis Date  . OSA (obstructive sleep apnea)   . Allergic rhinitis   . CTS (carpal tunnel syndrome)   . Arthritis    Past Surgical History  Procedure Date  . Cervical laminectomy   . Rotator cuff repair     bilateral   . Neck surgery   . Knee surgery   . Back surgery     C4-5-6   2 cervical    from AA  . Carpal tunnel release 09/16/2011    Procedure: CARPAL TUNNEL RELEASE;  Surgeon: Karn Cassis, MD;  Location: [facility];  Service: Neurosurgery;  Laterality: Left;  Left Carpal Tunnel Release   Family History  Problem Relation Age of Onset  . Hyperlipidemia    . Prostate cancer     History   Social History  . Marital Status: Married    Spouse Name: N/A    Number of Children: N/A    . Years of Education: N/A   Occupational History  . Retired    Social History Main Topics  . Smoking status: Never Smoker   . Smokeless tobacco: Not on file  . Alcohol Use: 3.0 oz/week    5 Cans of beer per week     socially  . Drug Use: No  . Sexually Active: Not on file   Other Topics Concern  . Not on file   Social History Narrative  . No narrative on file    Current Outpatient Prescriptions on File Prior to Visit  Medication Sig Dispense Refill  . Ascorbic Acid (VITAMIN C) 1000 MG tablet Take 1,000 mg by mouth daily.        Marland Kitchen aspirin 81 MG tablet Take 81 mg by mouth daily.        . cholecalciferol (VITAMIN D) 1000 UNITS tablet Take 1,000 Units by mouth daily.        Marland Kitchen glucosamine-chondroitin 500-400 MG tablet Take 1 tablet by mouth daily.       . Multiple Vitamins-Minerals (CENTRUM SILVER PO) Take 1 tablet by mouth daily.        Marland Kitchen  Phosphatidylserine-DHA-EPA 100-19.5-6.5 MG CAPS Take 1 capsule by mouth daily.  90 capsule  3  . pseudoephedrine (SUDAFED 12 HOUR) 120 MG 12 hr tablet Take 120 mg by mouth every 12 (twelve) hours as needed. For congestion      . vitamin E 800 UNIT capsule Take 800 Units by mouth daily.           Review of Systems  All other systems reviewed and are negative.       Objective:   Physical Exam  Nursing note and vitals reviewed. Constitutional: He is oriented to person, place, and time. No distress.  HENT:  Head: Normocephalic and atraumatic.  Right Ear: External ear normal.  Left Ear: External ear normal.       Right and left external ear are clear of debris. Right external ear canal has spots of blood. TM visualized in right ear and was not perforated or bulging. TM visualized in left ear and was normal and intact with good motion.    Eyes: Conjunctivae normal are normal. Pupils are equal, round, and reactive to light. Right eye exhibits no discharge. Left eye exhibits no discharge.  Pulmonary/Chest: Effort normal.  Neurological: He  is alert and oriented to person, place, and time. Coordination (no gait anomalies, able to perform tandem gait. ) normal.       Negative romberg test.  Marena Chancy test localizes to the left. Negative rinne test in both ears.  Could not hear fingers rubbing together in left ear.  Skin: He is not diaphoretic.  Psychiatric: He has a normal mood and affect. His behavior is normal. Judgment and thought content normal.   Filed Vitals:   05/04/12 1132  BP: 138/82  Pulse: 64  Temp: 98.2 F (36.8 C)  Resp: 16       Assessment:     1. Bloody Discharge from Right Ear - This is from q-tip trauma. There are no indications of an inner ear cause, and there are no signs of infection.  PLAN - There is nothing to do for this. It will get better with time. Advise patient to stop use q-tips  and educate about good ear practices.     Attending note: patient interviewed and examined. Agree with the assessment and plan per Mr. Kai Levins, MS III

## 2012-05-04 NOTE — Patient Instructions (Addendum)
1. Bloody Ear Discharge - This is from q-tip damage. There is nothing to do for this. It is not infected. You should refrain from using q-tips because they can irritate and damage the external ear canal. They can also cause wax impactions. Wash your ear with soap and water and only use a washcloth.

## 2012-05-04 NOTE — Telephone Encounter (Signed)
Caller: Naftali/Patient; Patient Name: Ronald Franklin; PCP: Sanda Linger (Adults only); Best Callback Phone Number: [phone]; Call regarding, Right Ear Bloody Discharge, 1/4 to 1 tsp, onset 9-17.  Patient denies injury or pain.  Patient feels as though "Cold air is getting to area that normally don't get air".  All emergent symptoms ruled out per Ear Symptoms, see in 4 hours, next available appointment scheduled on 9-18 at 11:15 with Dr Debby Bud, no availablity with Dr Yetta Barre.  Patient verbalized understanding.

## 2012-07-04 ENCOUNTER — Other Ambulatory Visit: Payer: Self-pay | Admitting: Orthopedic Surgery

## 2012-07-05 ENCOUNTER — Encounter (HOSPITAL_COMMUNITY): Payer: Self-pay | Admitting: Pharmacy Technician

## 2012-07-05 ENCOUNTER — Encounter (HOSPITAL_COMMUNITY)
Admission: RE | Admit: 2012-07-05 | Discharge: 2012-07-05 | Disposition: A | Discharge status: Home / Self Care | Payer: Managed Care, Other (non HMO) | Source: Ambulatory Visit | Attending: Orthopedic Surgery | Admitting: Orthopedic Surgery

## 2012-07-05 LAB — SURGICAL PCR SCREEN
MRSA, PCR: NEGATIVE
Staphylococcus aureus: POSITIVE — AB

## 2012-07-05 LAB — CBC
HCT: 42.4 % (ref 39.0–52.0)
Hemoglobin: 14.6 g/dL (ref 13.0–17.0)
RBC: 4.71 MIL/uL (ref 4.22–5.81)
WBC: 6.6 10*3/uL (ref 4.0–10.5)

## 2012-07-05 NOTE — Patient Instructions (Signed)
20      Your procedure is scheduled on:  Wednesday 07/06/2012 at 315 pm  Report to [facility] at 1245 pm  Call this number if you have problems the morning of surgery: [phone]   Remember:   Do not eat food after midnight!  MAY HAVE CLEAR LIQUIDS FROM MIDNIGHT UP UNTIL 0915 AM THEN NOTHING UNTIL AFTER SURGERY!  Take these medicines the morning of surgery with A SIP OF WATER:    Do not bring valuables to the hospital.  .  Leave suitcase in the car. After surgery it may be brought to your room.  For patients admitted to the hospital, checkout time is 11:00 AM the day of              Discharge.    Special Instructions: See [facility] Preparing  For Surgery Instruction Sheet. Do not wear jewelry, lotions powders, perfumes. Women do not shave legs or underarms for 12 hours before showers. Contacts, partial plates, or dentures may not be worn into surgery.                          Patients discharged the day of surgery will not be allowed to drive home. If going home the same day of surgery, must have someone stay with you first 24 hrs.at home and arrange for someone to drive you home from the Hospital.              YOUR DRIVER WU:[id number] Brooks-son's girlfriend, or wife Eunice Blase   Please read over the following fact sheets that you were given: MRSA INFORMATION,INCENTIVE SPIROMETRY SHEET, SLEEP APNEA SHEET                            Telford Nab.Bailynn Dyk,RN,BSN     [phone]

## 2012-07-06 ENCOUNTER — Encounter (HOSPITAL_COMMUNITY): Payer: Self-pay | Admitting: Anesthesiology

## 2012-07-06 ENCOUNTER — Encounter (HOSPITAL_COMMUNITY)
Admission: RE | Disposition: A | Discharge status: Home / Self Care | Payer: Self-pay | Source: Ambulatory Visit | Attending: Orthopedic Surgery

## 2012-07-06 ENCOUNTER — Ambulatory Visit (HOSPITAL_COMMUNITY)
Admission: RE | Admit: 2012-07-06 | Discharge: 2012-07-06 | Disposition: A | Discharge status: Home / Self Care | Payer: Managed Care, Other (non HMO) | Source: Ambulatory Visit | Attending: Orthopedic Surgery | Admitting: Orthopedic Surgery

## 2012-07-06 ENCOUNTER — Ambulatory Visit (HOSPITAL_COMMUNITY): Payer: Managed Care, Other (non HMO) | Admitting: Anesthesiology

## 2012-07-06 ENCOUNTER — Encounter (HOSPITAL_COMMUNITY): Payer: Self-pay | Admitting: *Deleted

## 2012-07-06 DIAGNOSIS — G4733 Obstructive sleep apnea (adult) (pediatric): Secondary | ICD-10-CM | POA: Insufficient documentation

## 2012-07-06 DIAGNOSIS — M9420 Chondromalacia, unspecified site: Secondary | ICD-10-CM | POA: Insufficient documentation

## 2012-07-06 DIAGNOSIS — X58XXXA Exposure to other specified factors, initial encounter: Secondary | ICD-10-CM | POA: Insufficient documentation

## 2012-07-06 DIAGNOSIS — Z9889 Other specified postprocedural states: Secondary | ICD-10-CM

## 2012-07-06 DIAGNOSIS — Z79899 Other long term (current) drug therapy: Secondary | ICD-10-CM | POA: Insufficient documentation

## 2012-07-06 DIAGNOSIS — Z01812 Encounter for preprocedural laboratory examination: Secondary | ICD-10-CM | POA: Insufficient documentation

## 2012-07-06 DIAGNOSIS — IMO0002 Reserved for concepts with insufficient information to code with codable children: Secondary | ICD-10-CM | POA: Insufficient documentation

## 2012-07-06 HISTORY — PX: KNEE ARTHROSCOPY WITH MEDIAL MENISECTOMY: SHX5651

## 2012-07-06 SURGERY — ARTHROSCOPY, KNEE, WITH MEDIAL MENISCECTOMY
Anesthesia: General | Site: Knee | Laterality: Left | Wound class: Clean

## 2012-07-06 MED ORDER — PROMETHAZINE HCL 25 MG/ML IJ SOLN: 6.2500 mg | INTRAMUSCULAR | Status: DC | PRN

## 2012-07-06 MED ORDER — BUPIVACAINE-EPINEPHRINE 0.5% -1:200000 IJ SOLN
INTRAMUSCULAR | Status: DC | PRN
  Administered 2012-07-06: 20 mL

## 2012-07-06 MED ORDER — HYDROMORPHONE HCL PF 1 MG/ML IJ SOLN: 0.2500 mg | INTRAMUSCULAR | Status: DC | PRN

## 2012-07-06 MED ORDER — MORPHINE SULFATE 4 MG/ML IJ SOLN
INTRAMUSCULAR | Status: DC | PRN
  Administered 2012-07-06: 1 mg via INTRAVENOUS

## 2012-07-06 MED ORDER — BUPIVACAINE-EPINEPHRINE 0.5% -1:200000 IJ SOLN
INTRAMUSCULAR | Status: AC
  Filled 2012-07-06: qty 1

## 2012-07-06 MED ORDER — ONDANSETRON HCL 4 MG/2ML IJ SOLN
INTRAMUSCULAR | Status: DC | PRN
  Administered 2012-07-06: 4 mg via INTRAVENOUS

## 2012-07-06 MED ORDER — EPINEPHRINE HCL 1 MG/ML IJ SOLN
INTRAMUSCULAR | Status: AC
  Filled 2012-07-06: qty 1

## 2012-07-06 MED ORDER — OXYCODONE-ACETAMINOPHEN 7.5-325 MG PO TABS: 1.0000 | ORAL_TABLET | ORAL | Status: DC | PRN

## 2012-07-06 MED ORDER — LACTATED RINGERS IV SOLN
INTRAVENOUS | Status: DC | PRN
  Administered 2012-07-06 (×2): via INTRAVENOUS

## 2012-07-06 MED ORDER — PROPOFOL 10 MG/ML IV EMUL
INTRAVENOUS | Status: DC | PRN
  Administered 2012-07-06: 180 mg via INTRAVENOUS

## 2012-07-06 MED ORDER — LIDOCAINE HCL (CARDIAC) 20 MG/ML IV SOLN
INTRAVENOUS | Status: DC | PRN
  Administered 2012-07-06: 50 mg via INTRAVENOUS

## 2012-07-06 MED ORDER — MIDAZOLAM HCL 5 MG/5ML IJ SOLN
INTRAMUSCULAR | Status: DC | PRN
  Administered 2012-07-06: 2 mg via INTRAVENOUS

## 2012-07-06 MED ORDER — MORPHINE SULFATE 4 MG/ML IJ SOLN
INTRAMUSCULAR | Status: AC
  Filled 2012-07-06: qty 1

## 2012-07-06 MED ORDER — POVIDONE-IODINE 7.5 % EX SOLN: Freq: Once | CUTANEOUS | Status: DC

## 2012-07-06 MED ORDER — LACTATED RINGERS IV SOLN
INTRAVENOUS | Status: DC | PRN
  Administered 2012-07-06: 17:00:00

## 2012-07-06 MED ORDER — METHOCARBAMOL 500 MG PO TABS: 500.0000 mg | ORAL_TABLET | Freq: Four times a day (QID) | ORAL | Status: DC

## 2012-07-06 MED ORDER — FENTANYL CITRATE 0.05 MG/ML IJ SOLN
INTRAMUSCULAR | Status: DC | PRN
  Administered 2012-07-06 (×2): 50 ug via INTRAVENOUS
  Administered 2012-07-06: 100 ug via INTRAVENOUS
  Administered 2012-07-06: 50 ug via INTRAVENOUS

## 2012-07-06 MED ORDER — MUPIROCIN 2 % EX OINT
TOPICAL_OINTMENT | Freq: Two times a day (BID) | CUTANEOUS | Status: DC
  Administered 2012-07-06: 1 via NASAL
  Filled 2012-07-06 (×2): qty 22

## 2012-07-06 SURGICAL SUPPLY — 26 items
BANDAGE ELASTIC 6 VELCRO ST LF (GAUZE/BANDAGES/DRESSINGS) ×4 IMPLANT
BANDAGE GAUZE ELAST BULKY 4 IN (GAUZE/BANDAGES/DRESSINGS) ×2 IMPLANT
BLADE 4.2CUDA (BLADE) IMPLANT
BLADE CUDA SHAVER 3.5 (BLADE) ×2 IMPLANT
CLOTH BEACON ORANGE TIMEOUT ST (SAFETY) ×2 IMPLANT
CUFF TOURN SGL QUICK 34 (TOURNIQUET CUFF) ×1
CUFF TRNQT CYL 34X4X40X1 (TOURNIQUET CUFF) ×1 IMPLANT
DRSG EMULSION OIL 3X3 NADH (GAUZE/BANDAGES/DRESSINGS) ×2 IMPLANT
DRSG PAD ABDOMINAL 8X10 ST (GAUZE/BANDAGES/DRESSINGS) ×2 IMPLANT
DURAPREP 26ML APPLICATOR (WOUND CARE) ×2 IMPLANT
ELECT REM PT RETURN 9FT ADLT (ELECTROSURGICAL) ×2
ELECTRODE REM PT RTRN 9FT ADLT (ELECTROSURGICAL) ×1 IMPLANT
GLOVE ECLIPSE 8.0 STRL XLNG CF (GLOVE) ×2 IMPLANT
GLOVE INDICATOR 8.0 STRL GRN (GLOVE) ×4 IMPLANT
GLOVE SURG SS PI 7.0 STRL IVOR (GLOVE) ×2 IMPLANT
MANIFOLD NEPTUNE II (INSTRUMENTS) ×4 IMPLANT
PACK ARTHROSCOPY WL (CUSTOM PROCEDURE TRAY) ×2 IMPLANT
PAD CAST 4YDX4 CTTN HI CHSV (CAST SUPPLIES) ×1 IMPLANT
PADDING CAST COTTON 4X4 STRL (CAST SUPPLIES) ×1
POSITIONER SURGICAL ARM (MISCELLANEOUS) IMPLANT
SET ARTHROSCOPY TUBING (MISCELLANEOUS) ×2
SET ARTHROSCOPY TUBING LN (MISCELLANEOUS) ×1 IMPLANT
SPONGE GAUZE 4X4 12PLY (GAUZE/BANDAGES/DRESSINGS) ×2 IMPLANT
SUT ETHILON 4 0 PS 2 18 (SUTURE) ×2 IMPLANT
WAND 90 DEG TURBOVAC W/CORD (SURGICAL WAND) ×2 IMPLANT
WRAP KNEE MAXI GEL POST OP (GAUZE/BANDAGES/DRESSINGS) ×2 IMPLANT

## 2012-07-06 NOTE — Transfer of Care (Signed)
Immediate Anesthesia Transfer of Care Note  Patient: Ronald Franklin  Procedure(s) Performed: Procedure(s) (LRB) with comments: KNEE ARTHROSCOPY WITH MEDIAL MENISECTOMY (Left) - LEFT KNEE ARTHROSCOPY WITH PARTIAL MEDIAL MENISECTOMY  Patient Location: PACU  Anesthesia Type:General  Level of Consciousness: awake, alert  and oriented  Airway & Oxygen Therapy: Patient Spontanous Breathing and Patient connected to face mask oxygen  Post-op Assessment: Report given to PACU RN and Post -op Vital signs reviewed and stable  Post vital signs: Reviewed and stable  Complications: No apparent anesthesia complications

## 2012-07-06 NOTE — Addendum Note (Signed)
Addendum  created 07/06/12 2019 by Bevelyn Buckles, CRNA   Modules edited:Charges VN

## 2012-07-06 NOTE — Anesthesia Preprocedure Evaluation (Signed)
Anesthesia Evaluation  Patient identified by MRN, date of birth, ID band Patient awake    Reviewed: Allergy & Precautions, H&P , NPO status , Patient's Chart, lab work & pertinent test results  Airway Mallampati: II TM Distance: >3 FB Neck ROM: Full    Dental No notable dental hx.    Pulmonary neg pulmonary ROS, sleep apnea ,  breath sounds clear to auscultation  Pulmonary exam normal       Cardiovascular negative cardio ROS  Rhythm:Regular Rate:Normal     Neuro/Psych  Neuromuscular disease negative neurological ROS  negative psych ROS   GI/Hepatic negative GI ROS, Neg liver ROS,   Endo/Other  negative endocrine ROS  Renal/GU negative Renal ROS  negative genitourinary   Musculoskeletal negative musculoskeletal ROS (+)   Abdominal   Peds negative pediatric ROS (+)  Hematology negative hematology ROS (+)   Anesthesia Other Findings   Reproductive/Obstetrics negative OB ROS                           Anesthesia Physical Anesthesia Plan  ASA: II  Anesthesia Plan: General   Post-op Pain Management:    Induction: Intravenous  Airway Management Planned: LMA  Additional Equipment:   Intra-op Plan:   Post-operative Plan: Extubation in OR  Informed Consent: I have reviewed the patients History and Physical, chart, labs and discussed the procedure including the risks, benefits and alternatives for the proposed anesthesia with the patient or authorized representative who has indicated his/her understanding and acceptance.   Dental advisory given  Plan Discussed with: CRNA  Anesthesia Plan Comments:         Anesthesia Quick Evaluation

## 2012-07-06 NOTE — Anesthesia Postprocedure Evaluation (Signed)
  Anesthesia Post-op Note  Patient: Ronald Franklin  Procedure(s) Performed: Procedure(s) (LRB): KNEE ARTHROSCOPY WITH MEDIAL MENISECTOMY (Left)  Patient Location: PACU  Anesthesia Type: General  Level of Consciousness: awake and alert   Airway and Oxygen Therapy: Patient Spontanous Breathing  Post-op Pain: mild  Post-op Assessment: Post-op Vital signs reviewed, Patient's Cardiovascular Status Stable, Respiratory Function Stable, Patent Airway and No signs of Nausea or vomiting  Last Vitals:  Filed Vitals:   07/06/12 1800  BP: 115/69  Pulse: 70  Temp:   Resp: 13    Post-op Vital Signs: stable   Complications: No apparent anesthesia complications. No complaints.

## 2012-07-06 NOTE — Brief Op Note (Signed)
07/06/2012  6:09 PM  PATIENT:  Ronald Franklin  61 y.o. male  PRE-OPERATIVE DIAGNOSIS:  LEFT KNEE TORN MEDIAL MENISCUS  POST-OPERATIVE DIAGNOSIS:  LEFT KNEE TORN MEDIAL MENISCUS  PROCEDURE:  Procedure(s) (LRB) with comments: KNEE ARTHROSCOPY WITH MEDIAL MENISECTOMY (Left) - LEFT KNEE ARTHROSCOPY WITH PARTIAL MEDIAL MENISECTOMY  SURGEON:  Surgeon(s) and Role:    * Drucilla Schmidt, MD - Primary  PHYSICIAN ASSISTANT:   ASSISTANTS:nurse  ANESTHESIA:   local and general  EBL:  Total I/O In: 1050 [I.V.:1050] Out: -   BLOOD ADMINISTERED:none  DRAINS: none   LOCAL MEDICATIONS USED:  MARCAINE     SPECIMEN:  No Specimen  DISPOSITION OF SPECIMEN:  N/A  COUNTS:  YES  TOURNIQUET:   Total Tourniquet Time Documented: Thigh (Left) - 56 minutes  DICTATION: .Other Dictation: Dictation Number [id number]  PLAN OF CARE: Discharge to home after PACU  PATIENT DISPOSITION:  PACU - hemodynamically stable.   Delay start of Pharmacological VTE agent (>24hrs) due to surgical blood loss or risk of bleeding: yes

## 2012-07-06 NOTE — H&P (Signed)
Ronald Franklin is an 61 y.o. male.   Chief Complaint: painful lt knee HPI:MRI demonstrates a torn medial meniscus  Past Medical History  Diagnosis Date  . OSA (obstructive sleep apnea)   . Allergic rhinitis   . CTS (carpal tunnel syndrome)   . Arthritis     Past Surgical History  Procedure Date  . Cervical laminectomy   . Rotator cuff repair     bilateral   . Neck surgery   . Knee surgery   . Back surgery     C4-5-6   2 cervical    from AA  . Carpal tunnel release 09/16/2011    Procedure: CARPAL TUNNEL RELEASE;  Surgeon: Karn Cassis, MD;  Location: [facility];  Service: Neurosurgery;  Laterality: Left;  Left Carpal Tunnel Release    Family History  Problem Relation Age of Onset  . Hyperlipidemia    . Prostate cancer     Social History:  reports that he has never smoked. He does not have any smokeless tobacco history on file. He reports that he drinks about 3 ounces of alcohol per week. He reports that he does not use illicit drugs.  Allergies: No Known Allergies  Medications Prior to Admission  Medication Sig Dispense Refill  . Ascorbic Acid (VITAMIN C) 1000 MG tablet Take 1,000 mg by mouth daily.       Marland Kitchen aspirin 81 MG tablet Take 81 mg by mouth daily.       . cholecalciferol (VITAMIN D) 1000 UNITS tablet Take 1,000 Units by mouth daily.       . Cyanocobalamin (VITAMIN B 12 PO) Take 1 tablet by mouth daily.      Marland Kitchen glucosamine-chondroitin 500-400 MG tablet Take 1 tablet by mouth daily.       . Multiple Vitamins-Minerals (CENTRUM SILVER PO) Take 1 tablet by mouth daily.       . NON FORMULARY Take 1 capsule by mouth daily. muscadine seed 650 mg      . pseudoephedrine (SUDAFED 12 HOUR) 120 MG 12 hr tablet Take 120 mg by mouth every 12 (twelve) hours as needed. For congestion      . vitamin E 800 UNIT capsule Take 800 Units by mouth daily.         Results for orders placed during the hospital encounter of 07/05/12 (from the past 48 hour(s))  SURGICAL PCR SCREEN      Status: Abnormal   Collection Time   07/05/12  2:56 PM      Component Value Range Comment   MRSA, PCR NEGATIVE  NEGATIVE    Staphylococcus aureus POSITIVE (*) NEGATIVE   CBC     Status: Normal   Collection Time   07/05/12  3:00 PM      Component Value Range Comment   WBC 6.6  4.0 - 10.5 K/uL    RBC 4.71  4.22 - 5.81 MIL/uL    Hemoglobin 14.6  13.0 - 17.0 g/dL    HCT 28.4  13.2 - 44.0 %    MCV 90.0  78.0 - 100.0 fL    MCH 31.0  26.0 - 34.0 pg    MCHC 34.4  30.0 - 36.0 g/dL    RDW 10.2  72.5 - 36.6 %    Platelets 211  150 - 400 K/uL    No results found.  ROS  Blood pressure 151/89, pulse 73, temperature 98.4 F (36.9 C), temperature source Oral, resp. rate 16, SpO2 99.00%. Physical Exam  Constitutional: He is oriented to person, place, and time. He appears well-developed and well-nourished.  HENT:  Head: Atraumatic.  Right Ear: External ear normal.  Left Ear: External ear normal.  Nose: Nose normal.  Mouth/Throat: Oropharynx is clear and moist.  Eyes: Conjunctivae normal and EOM are normal. Pupils are equal, round, and reactive to light.  Neck: Normal range of motion. Neck supple.  Cardiovascular: Normal rate, regular rhythm, normal heart sounds and intact distal pulses.   Respiratory: Effort normal and breath sounds normal.  GI: Soft. Bowel sounds are normal.  Musculoskeletal: Normal range of motion.  Neurological: He is alert and oriented to person, place, and time. He has normal reflexes.  Skin: Skin is warm and dry.  Psychiatric: He has a normal mood and affect. His behavior is normal. Judgment and thought content normal.     Assessment/Plan Torn medial meniscus lt knee Arthroscopy lt knee with partial medial meniscectomy  Breana Litts P 07/06/2012, 4:14 PM

## 2012-07-07 NOTE — Op Note (Signed)
NAME:  Ronald Franklin, Ronald Franklin NO.:  [id number]  MEDICAL RECORD NO.:  [id number]  LOCATION:  [facility]                         FACILITY:  [facility]  PHYSICIAN:  Marlowe Kays, M.D.  DATE OF BIRTH:  Jan 11, 1951  DATE OF PROCEDURE:  07/06/2012 DATE OF DISCHARGE:  07/06/2012                              OPERATIVE REPORT   PREOPERATIVE DIAGNOSIS:  Torn medial meniscus, left knee.  POSTOPERATIVE DIAGNOSES: 1. Torn medial meniscus. 2. 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  OPERATION:  Left knee arthroscopy with: 1. Partial medial meniscectomy. 2. Shaving of medial femoral condyle.  SURGEON:  Marlowe Kays, MD  ASSISTANT:  Nurse.  ANESTHESIA:  General.  PATHOLOGY AND JUSTIFICATION FOR PROCEDURE:  He has had a history of prior knee arthroscopies.  On this occasion, he developed painful left knee to the point where he was barely able to walk and an MRI demonstrated a complex tear of the posterior medial meniscus. Consequently he is here for the above-mentioned surgery.  PROCEDURE:  Satisfactory general anesthesia, Ace wrap, and knee support to right lower extremity, pneumatic tourniquet applied to left lower extremity with leg Esmarch out nonsterilely and tourniquet inflated to 300 mmHg.  Thigh stabilizer was applied.  Left leg was then prepped with DuraPrep from stabilizer to ankle and draped in sterile field.  Time-out performed.  Superior medial saline inflow.  First, through an anterolateral portal, medial compartment joint was evaluated.  He had grade 2/4 chondromalacia over the most medial femoral condyle, which I tried rasping down but subsequently ended up shaving with a 3.5 shaver. He also had bulbous thickened fibrotic deformity of the weightbearing surface of the anterior third of the medial meniscus and I felt this most likely represent prior surgery and I shaved this down until smooth. Looking posteriorly, he had a significant tear of the  posterior curve and a more subtle tear of the intercondylar area of the posterior medial meniscus.  I trimmed this back to stable rim with a combination of baskets and a 3.5 shaver.  The meniscus tear posteriorly at the curve was laminated with 2 layers but there was no residual unstable fragment left.  I then reversed portals, lateral joint looked normal.  I looked at the lateral gutter and suprapatellar area, which showed minimal arthritic changes in the patella.  Knee joint was irrigated to clear and all fluid possible was removed.  I closed the 2 portals anteriorly with 4-0 nylon and then injected 20 mL of 0.5% Marcaine with adrenaline and 4 mg of morphine through the inflow apparatus which I closed with 4-0 nylon as well.  Betadine, Adaptic, and dry sterile dressing were applied.  Tourniquet was released.  He tolerated the procedure well, was taken to recovery room in satisfied condition with no known complications.          ______________________________ Marlowe Kays, M.D.     JA/MEDQ  D:  07/06/2012  T:  07/07/2012  Job:  [id number]

## 2012-07-08 ENCOUNTER — Encounter (HOSPITAL_COMMUNITY): Payer: Self-pay | Admitting: Orthopedic Surgery

## 2012-10-01 ENCOUNTER — Other Ambulatory Visit: Payer: Self-pay

## 2013-05-10 ENCOUNTER — Ambulatory Visit: Payer: Managed Care, Other (non HMO) | Admitting: Internal Medicine

## 2013-05-10 ENCOUNTER — Encounter: Payer: Self-pay | Admitting: Internal Medicine

## 2013-05-10 ENCOUNTER — Ambulatory Visit (INDEPENDENT_AMBULATORY_CARE_PROVIDER_SITE_OTHER): Payer: BC Managed Care – PPO | Admitting: Internal Medicine

## 2013-05-10 VITALS — BP 122/78 | HR 65 | Temp 99.0°F | Ht 72.0 in | Wt 212.4 lb

## 2013-05-10 DIAGNOSIS — H538 Other visual disturbances: Secondary | ICD-10-CM

## 2013-05-10 DIAGNOSIS — H5710 Ocular pain, unspecified eye: Secondary | ICD-10-CM

## 2013-05-10 DIAGNOSIS — H5711 Ocular pain, right eye: Secondary | ICD-10-CM

## 2013-05-10 NOTE — Patient Instructions (Signed)
Please continue all other medications as before  You will be contacted regarding the referral for: opthomology to make sure you dont have acute angle closure glaucoma

## 2013-05-14 DIAGNOSIS — H538 Other visual disturbances: Secondary | ICD-10-CM | POA: Insufficient documentation

## 2013-05-14 DIAGNOSIS — H5710 Ocular pain, unspecified eye: Secondary | ICD-10-CM | POA: Insufficient documentation

## 2013-05-14 NOTE — Assessment & Plan Note (Signed)
?   Viral conjunct vs other, for optho referral as above

## 2013-05-14 NOTE — Progress Notes (Signed)
Subjective:    Patient ID: Ronald Franklin, male    DOB: Feb 20, 1951, 62 y.o.   MRN: [id number]  HPI  Here to f/u, as recent antibx have not helped redness of the right eye, now with mildly worsening pain and blurry vision, wihtout fever, HA, colored d/c Past Medical History  Diagnosis Date  . OSA (obstructive sleep apnea)   . Allergic rhinitis   . CTS (carpal tunnel syndrome)   . Arthritis    Past Surgical History  Procedure Laterality Date  . Cervical laminectomy    . Rotator cuff repair      bilateral   . Neck surgery    . Knee surgery    . Back surgery      C4-5-6   2 cervical    from AA  . Carpal tunnel release  09/16/2011    Procedure: CARPAL TUNNEL RELEASE;  Surgeon: Karn Cassis, MD;  Location: [facility];  Service: Neurosurgery;  Laterality: Left;  Left Carpal Tunnel Release  . Knee arthroscopy with medial menisectomy  07/06/2012    Procedure: KNEE ARTHROSCOPY WITH MEDIAL MENISECTOMY;  Surgeon: Drucilla Schmidt, MD;  Location: [facility];  Service: Orthopedics;  Laterality: Left;  LEFT KNEE ARTHROSCOPY WITH PARTIAL MEDIAL MENISECTOMY    reports that he has never smoked. He does not have any smokeless tobacco history on file. He reports that he drinks about 3.0 ounces of alcohol per week. He reports that he does not use illicit drugs. family history includes Hyperlipidemia in an other family member; Prostate cancer in an other family member. No Known Allergies Current Outpatient Prescriptions on File Prior to Visit  Medication Sig Dispense Refill  . Ascorbic Acid (VITAMIN C) 1000 MG tablet Take 1,000 mg by mouth daily.       Marland Kitchen aspirin 81 MG tablet Take 81 mg by mouth daily.       . cholecalciferol (VITAMIN D) 1000 UNITS tablet Take 1,000 Units by mouth daily.       . Cyanocobalamin (VITAMIN B 12 PO) Take 1 tablet by mouth daily.      Marland Kitchen glucosamine-chondroitin 500-400 MG tablet Take 1 tablet by mouth daily.       . methocarbamol (ROBAXIN) 500 MG tablet Take 1 tablet (500  mg total) by mouth 4 (four) times daily.  30 tablet  none  . Multiple Vitamins-Minerals (CENTRUM SILVER PO) Take 1 tablet by mouth daily.       . NON FORMULARY Take 1 capsule by mouth daily. muscadine seed 650 mg      . oxyCODONE-acetaminophen (PERCOCET) 7.5-325 MG per tablet Take 1 tablet by mouth every 4 (four) hours as needed for pain.  30 tablet  0  . pseudoephedrine (SUDAFED 12 HOUR) 120 MG 12 hr tablet Take 120 mg by mouth every 12 (twelve) hours as needed. For congestion      . vitamin E 800 UNIT capsule Take 800 Units by mouth daily.        No current facility-administered medications on file prior to visit.   Review of Systems All otherwise neg per pt     Objective:   Physical Exam BP 122/78  Pulse 65  Temp(Src) 99 F (37.2 C) (Oral)  Ht 6' (1.829 m)  Wt 212 lb 6 oz (96.333 kg)  BMI 28.8 kg/m2  SpO2 97% VS noted,  Constitutional: Pt appears well-developed and well-nourished.  HENT: Head: NCAT.  Right Ear: External ear normal.  Left Ear: External ear normal.  Eyes:  Conjunctivae and EOM are normal. Except right conjuct erythema, clearish d/c, Pupils are equal, round, and reactive to light.  Neck: Normal range of motion. Neck supple.  Cardiovascular: Normal rate and regular rhythm.   Pulmonary/Chest: Effort normal and breath sounds normal.     Assessment & Plan:

## 2013-05-14 NOTE — Assessment & Plan Note (Signed)
Unclear etiology, cant r/o acute angle glaucoma, for optho referarral

## 2013-05-17 ENCOUNTER — Ambulatory Visit: Payer: Managed Care, Other (non HMO) | Admitting: Internal Medicine

## 2013-06-22 ENCOUNTER — Other Ambulatory Visit: Payer: Self-pay

## 2013-06-30 LAB — HM COLONOSCOPY: HM Colonoscopy: NORMAL

## 2013-07-28 ENCOUNTER — Encounter: Payer: BC Managed Care – PPO | Admitting: Internal Medicine

## 2013-08-04 ENCOUNTER — Ambulatory Visit (INDEPENDENT_AMBULATORY_CARE_PROVIDER_SITE_OTHER): Payer: BC Managed Care – PPO | Admitting: Internal Medicine

## 2013-08-04 ENCOUNTER — Other Ambulatory Visit (INDEPENDENT_AMBULATORY_CARE_PROVIDER_SITE_OTHER): Payer: BC Managed Care – PPO

## 2013-08-04 ENCOUNTER — Encounter: Payer: Self-pay | Admitting: Internal Medicine

## 2013-08-04 VITALS — BP 122/74 | HR 58 | Temp 98.1°F | Resp 16 | Ht 72.0 in | Wt 215.0 lb

## 2013-08-04 DIAGNOSIS — Z0000 Encounter for general adult medical examination without abnormal findings: Secondary | ICD-10-CM

## 2013-08-04 LAB — COMPREHENSIVE METABOLIC PANEL
ALT: 38 U/L (ref 0–53)
AST: 31 U/L (ref 0–37)
Alkaline Phosphatase: 60 U/L (ref 39–117)
Creatinine, Ser: 0.9 mg/dL (ref 0.4–1.5)
GFR: 88.13 mL/min (ref >60.00)
Total Bilirubin: 0.7 mg/dL (ref 0.3–1.2)

## 2013-08-04 LAB — CBC WITH DIFFERENTIAL/PLATELET
Basophils Absolute: 0 10*3/uL (ref 0.0–0.1)
Eosinophils Absolute: 0.3 10*3/uL (ref 0.0–0.7)
HCT: 42 % (ref 39.0–52.0)
Hemoglobin: 14.4 g/dL (ref 13.0–17.0)
Lymphs Abs: 1.8 10*3/uL (ref 0.7–4.0)
MCHC: 34.2 g/dL (ref 30.0–36.0)
Monocytes Absolute: 0.5 10*3/uL (ref 0.1–1.0)
Neutro Abs: 2.5 10*3/uL (ref 1.4–7.7)
Platelets: 179 10*3/uL (ref 150.0–400.0)
RDW: 13 % (ref 11.5–14.6)

## 2013-08-04 LAB — URINALYSIS, ROUTINE W REFLEX MICROSCOPIC
Hgb urine dipstick: NEGATIVE
Nitrite: NEGATIVE
Specific Gravity, Urine: 1.025 (ref 1.000–1.030)
Total Protein, Urine: NEGATIVE
pH: 6 (ref 5.0–8.0)

## 2013-08-04 LAB — LIPID PANEL
Cholesterol: 245 mg/dL — ABNORMAL HIGH (ref 0–200)
Triglycerides: 67 mg/dL (ref 0.0–149.0)
VLDL: 13.4 mg/dL (ref 0.0–40.0)

## 2013-08-04 NOTE — Progress Notes (Signed)
   Subjective:    Patient ID: Ronald Franklin, male    DOB: [birth year], 62 y.o.   MRN: [id number]  HPI  He returns for a complete physical - he feels well and offers no complaints.  Review of Systems  Constitutional: Negative.   HENT: Negative.   Eyes: Negative.   Respiratory: Negative.  Negative for cough, chest tightness, shortness of breath, wheezing and stridor.   Cardiovascular: Negative.  Negative for chest pain, palpitations and leg swelling.  Gastrointestinal: Negative.   Endocrine: Negative.   Genitourinary: Negative.   Musculoskeletal: Negative.   Skin: Negative.   Allergic/Immunologic: Negative.   Neurological: Negative.   Hematological: Negative.  Negative for adenopathy. Does not bruise/bleed easily.  Psychiatric/Behavioral: Negative.        Objective:   Physical Exam  Vitals reviewed. Constitutional: He is oriented to person, place, and time. He appears well-developed and well-nourished. No distress.  HENT:  Head: Normocephalic and atraumatic.  Mouth/Throat: Oropharynx is clear and moist. No oropharyngeal exudate.  Eyes: Conjunctivae are normal. Right eye exhibits no discharge. Left eye exhibits no discharge. No scleral icterus.  Neck: Normal range of motion. Neck supple. No JVD present. No tracheal deviation present. No thyromegaly present.  Cardiovascular: Normal rate, regular rhythm, normal heart sounds and intact distal pulses.  Exam reveals no gallop and no friction rub.   No murmur heard. Pulmonary/Chest: Effort normal and breath sounds normal. No stridor. No respiratory distress. He has no wheezes. He has no rales. He exhibits no tenderness.  Abdominal: Soft. Bowel sounds are normal. He exhibits no distension and no mass. There is no tenderness. There is no rebound and no guarding. Hernia confirmed negative in the right inguinal area and confirmed negative in the left inguinal area.  Genitourinary: Rectum normal, testes normal and penis normal. Rectal exam  shows no external hemorrhoid, no internal hemorrhoid, no fissure, no mass, no tenderness and anal tone normal. Guaiac negative stool. Prostate is enlarged (1+ smooth symm BPH - no nodules). Prostate is not tender. Right testis shows no mass, no swelling and no tenderness. Right testis is descended. Left testis shows no mass, no swelling and no tenderness. Left testis is descended. Circumcised. No penile erythema or penile tenderness. No discharge found.  Musculoskeletal: Normal range of motion. He exhibits no edema and no tenderness.  Lymphadenopathy:    He has no cervical adenopathy.       Right: No inguinal adenopathy present.       Left: No inguinal adenopathy present.  Neurological: He is oriented to person, place, and time.  Skin: Skin is warm and dry. No rash noted. He is not diaphoretic. No erythema. No pallor.  Psychiatric: He has a normal mood and affect. His behavior is normal. Judgment and thought content normal.     Lab Results  Component Value Date   WBC 6.6 07/05/2012   HGB 14.6 07/05/2012   HCT 42.4 07/05/2012   PLT 211 07/05/2012   GLUCOSE 80 09/17/2011   CHOL 221* 09/17/2011   TRIG 154.0* 09/17/2011   HDL 38.40* 09/17/2011   LDLDIRECT 157.7 09/17/2011   ALT 32 09/17/2011   AST 25 09/17/2011   NA 138 09/17/2011   K 4.6 09/17/2011   CL 101 09/17/2011   CREATININE 0.8 09/17/2011   BUN 18 09/17/2011   CO2 30 09/17/2011   TSH 2.68 09/17/2011   PSA 1.22 09/17/2011   INR 1.0 07/02/2008       Assessment & Plan:

## 2013-08-04 NOTE — Patient Instructions (Signed)
Health Maintenance, Males A healthy lifestyle and preventative care can promote health and wellness.  Maintain regular health, dental, and eye exams.  Eat a healthy diet. Foods like vegetables, fruits, whole grains, low-fat dairy products, and lean protein foods contain the nutrients you need without too many calories. Decrease your intake of foods high in solid fats, added sugars, and salt. Get information about a proper diet from your caregiver, if necessary.  Regular physical exercise is one of the most important things you can do for your health. Most adults should get at least 150 minutes of moderate-intensity exercise (any activity that increases your heart rate and causes you to sweat) each week. In addition, most adults need muscle-strengthening exercises on 2 or more days a week.   Maintain a healthy weight. The body mass index (BMI) is a screening tool to identify possible weight problems. It provides an estimate of body fat based on height and weight. Your caregiver can help determine your BMI, and can help you achieve or maintain a healthy weight. For adults 20 years and older:  A BMI below 18.5 is considered underweight.  A BMI of 18.5 to 24.9 is normal.  A BMI of 25 to 29.9 is considered overweight.  A BMI of 30 and above is considered obese.  Maintain normal blood lipids and cholesterol by exercising and minimizing your intake of saturated fat. Eat a balanced diet with plenty of fruits and vegetables. Blood tests for lipids and cholesterol should begin at age 20 and be repeated every 5 years. If your lipid or cholesterol levels are high, you are over 50, or you are a high risk for heart disease, you may need your cholesterol levels checked more frequently.Ongoing high lipid and cholesterol levels should be treated with medicines, if diet and exercise are not effective.  If you smoke, find out from your caregiver how to quit. If you do not use tobacco, do not start.  Lung  cancer screening is recommended for adults aged 55 80 years who are at high risk for developing lung cancer because of a history of smoking. Yearly low-dose computed tomography (CT) is recommended for people who have at least a 30-pack-year history of smoking and are a current smoker or have quit within the past 15 years. A pack year of smoking is smoking an average of 1 pack of cigarettes a day for 1 year (for example: 1 pack a day for 30 years or 2 packs a day for 15 years). Yearly screening should continue until the smoker has stopped smoking for at least 15 years. Yearly screening should also be stopped for people who develop a health problem that would prevent them from having lung cancer treatment.  If you choose to drink alcohol, do not exceed 2 drinks per day. One drink is considered to be 12 ounces (355 mL) of beer, 5 ounces (148 mL) of wine, or 1.5 ounces (44 mL) of liquor.  Avoid use of street drugs. Do not share needles with anyone. Ask for help if you need support or instructions about stopping the use of drugs.  High blood pressure causes heart disease and increases the risk of stroke. Blood pressure should be checked at least every 1 to 2 years. Ongoing high blood pressure should be treated with medicines if weight loss and exercise are not effective.  If you are 45 to 63 years old, ask your caregiver if you should take aspirin to prevent heart disease.  Diabetes screening involves taking a blood   sample to check your fasting blood sugar level. This should be done once every 3 years, after age 45, if you are within normal weight and without risk factors for diabetes. Testing should be considered at a younger age or be carried out more frequently if you are overweight and have at least 1 risk factor for diabetes.  Colorectal cancer can be detected and often prevented. Most routine colorectal cancer screening begins at the age of 50 and continues through age 75. However, your caregiver may  recommend screening at an earlier age if you have risk factors for colon cancer. On a yearly basis, your caregiver may provide home test kits to check for hidden blood in the stool. Use of a small camera at the end of a tube, to directly examine the colon (sigmoidoscopy or colonoscopy), can detect the earliest forms of colorectal cancer. Talk to your caregiver about this at age 50, when routine screening begins. Direct examination of the colon should be repeated every 5 to 10 years through age 75, unless early forms of pre-cancerous polyps or small growths are found.  Hepatitis C blood testing is recommended for all people born from 1945 through 1965 and any individual with known risks for hepatitis C.  Healthy men should no longer receive prostate-specific antigen (PSA) blood tests as part of routine cancer screening. Consult with your caregiver about prostate cancer screening.  Testicular cancer screening is not recommended for adolescents or adult males who have no symptoms. Screening includes self-exam, caregiver exam, and other screening tests. Consult with your caregiver about any symptoms you have or any concerns you have about testicular cancer.  Practice safe sex. Use condoms and avoid high-risk sexual practices to reduce the spread of sexually transmitted infections (STIs).  Use sunscreen. Apply sunscreen liberally and repeatedly throughout the day. You should seek shade when your shadow is shorter than you. Protect yourself by wearing long sleeves, pants, a wide-brimmed hat, and sunglasses year round, whenever you are outdoors.  Notify your caregiver of new moles or changes in moles, especially if there is a change in shape or color. Also notify your caregiver if a mole is larger than the size of a pencil eraser.  A one-time screening for abdominal aortic aneurysm (AAA) and surgical repair of large AAAs by sound wave imaging (ultrasonography) is recommended for ages 65 to 75 years who are  current or former smokers.  Stay current with your immunizations. Document Released: 01/30/2008 Document Revised: 11/28/2012 Document Reviewed: 12/29/2010 [employer] Patient Information 2014 [employer].  

## 2013-08-04 NOTE — Assessment & Plan Note (Signed)
He refused a Flu Vax today Exam done Labs ordered Pt ed material was given

## 2013-08-06 ENCOUNTER — Encounter: Payer: Self-pay | Admitting: Internal Medicine

## 2014-04-30 ENCOUNTER — Telehealth: Payer: Self-pay | Admitting: Internal Medicine

## 2014-04-30 DIAGNOSIS — G4733 Obstructive sleep apnea (adult) (pediatric): Secondary | ICD-10-CM

## 2014-04-30 NOTE — Telephone Encounter (Signed)
Patient is requesting script for nasal pillow, mask and supplies for CPAP sent to [facility].  Their fax is [phone]

## 2014-05-02 NOTE — Telephone Encounter (Signed)
Orders printed and faxed to [facility] at [phone] per patient request.

## 2014-10-04 ENCOUNTER — Encounter: Payer: Self-pay | Admitting: Internal Medicine

## 2014-10-04 ENCOUNTER — Other Ambulatory Visit (INDEPENDENT_AMBULATORY_CARE_PROVIDER_SITE_OTHER): Payer: Managed Care, Other (non HMO)

## 2014-10-04 ENCOUNTER — Ambulatory Visit (INDEPENDENT_AMBULATORY_CARE_PROVIDER_SITE_OTHER): Payer: Managed Care, Other (non HMO) | Admitting: Internal Medicine

## 2014-10-04 VITALS — BP 126/80 | HR 60 | Temp 98.2°F | Resp 16 | Ht 72.0 in | Wt 213.0 lb

## 2014-10-04 DIAGNOSIS — H9190 Unspecified hearing loss, unspecified ear: Secondary | ICD-10-CM | POA: Insufficient documentation

## 2014-10-04 DIAGNOSIS — Z0000 Encounter for general adult medical examination without abnormal findings: Secondary | ICD-10-CM

## 2014-10-04 DIAGNOSIS — H9191 Unspecified hearing loss, right ear: Secondary | ICD-10-CM

## 2014-10-04 DIAGNOSIS — E785 Hyperlipidemia, unspecified: Secondary | ICD-10-CM

## 2014-10-04 DIAGNOSIS — M7712 Lateral epicondylitis, left elbow: Secondary | ICD-10-CM

## 2014-10-04 LAB — LIPID PANEL
CHOL/HDL RATIO: 5
Cholesterol: 261 mg/dL — ABNORMAL HIGH (ref 0–200)
HDL: 50.9 mg/dL (ref >39.00)
LDL CALC: 197 mg/dL — AB (ref 0–99)
NONHDL: 210.1
TRIGLYCERIDES: 65 mg/dL (ref 0.0–149.0)
VLDL: 13 mg/dL (ref 0.0–40.0)

## 2014-10-04 LAB — COMPREHENSIVE METABOLIC PANEL
ALBUMIN: 4.5 g/dL (ref 3.5–5.2)
ALT: 36 U/L (ref 0–53)
AST: 28 U/L (ref 0–37)
Alkaline Phosphatase: 67 U/L (ref 39–117)
BUN: 22 mg/dL (ref 6–23)
CALCIUM: 10.2 mg/dL (ref 8.4–10.5)
CHLORIDE: 104 meq/L (ref 96–112)
CO2: 29 meq/L (ref 19–32)
Creatinine, Ser: 1.06 mg/dL (ref 0.40–1.50)
GFR: 74.57 mL/min (ref >60.00)
GLUCOSE: 101 mg/dL — AB (ref 70–99)
POTASSIUM: 4.8 meq/L (ref 3.5–5.1)
SODIUM: 139 meq/L (ref 135–145)
TOTAL PROTEIN: 7.3 g/dL (ref 6.0–8.3)
Total Bilirubin: 0.6 mg/dL (ref 0.2–1.2)

## 2014-10-04 LAB — CBC WITH DIFFERENTIAL/PLATELET
BASOS PCT: 0.7 % (ref 0.0–3.0)
Basophils Absolute: 0 10*3/uL (ref 0.0–0.1)
EOS ABS: 0.2 10*3/uL (ref 0.0–0.7)
Eosinophils Relative: 3 % (ref 0.0–5.0)
HCT: 43 % (ref 39.0–52.0)
Hemoglobin: 14.6 g/dL (ref 13.0–17.0)
LYMPHS PCT: 36.5 % (ref 12.0–46.0)
Lymphs Abs: 2.1 10*3/uL (ref 0.7–4.0)
MCHC: 34 g/dL (ref 30.0–36.0)
MCV: 90.1 fl (ref 78.0–100.0)
Monocytes Absolute: 0.7 10*3/uL (ref 0.1–1.0)
Monocytes Relative: 11.7 % (ref 3.0–12.0)
NEUTROS PCT: 48.1 % (ref 43.0–77.0)
Neutro Abs: 2.8 10*3/uL (ref 1.4–7.7)
PLATELETS: 203 10*3/uL (ref 150.0–400.0)
RBC: 4.78 Mil/uL (ref 4.22–5.81)
RDW: 14.1 % (ref 11.5–15.5)
WBC: 5.7 10*3/uL (ref 4.0–10.5)

## 2014-10-04 LAB — TSH: TSH: 2.51 u[IU]/mL (ref 0.35–4.50)

## 2014-10-04 LAB — PSA: PSA: 1.61 ng/mL (ref 0.10–4.00)

## 2014-10-04 MED ORDER — DICLOFENAC 35 MG PO CAPS: 1.0000 | ORAL_CAPSULE | Freq: Three times a day (TID) | ORAL | Status: DC | PRN

## 2014-10-04 NOTE — Assessment & Plan Note (Signed)
He refused a flu vax Exam done Labs ordered and reviewed Pt ed material was given

## 2014-10-04 NOTE — Assessment & Plan Note (Signed)
Audiology referral for evaluation and treatment

## 2014-10-04 NOTE — Progress Notes (Signed)
Subjective:    Patient ID: Ronald Franklin, male    DOB: June 21, 1951, 64 y.o.   MRN: [id number]  Hyperlipidemia This is a chronic problem. The current episode started more than 1 year ago. The problem is uncontrolled. Recent lipid tests were reviewed and are variable. He has no history of chronic renal disease, diabetes, hypothyroidism, liver disease, obesity or nephrotic syndrome. Factors aggravating his hyperlipidemia include fatty foods. Pertinent negatives include no chest pain, focal sensory loss, focal weakness, leg pain, myalgias or shortness of breath. Current antihyperlipidemic treatment includes exercise and diet change. The current treatment provides mild improvement of lipids. Compliance problems include adherence to diet.       Review of Systems  Constitutional: Negative.  Negative for fever, chills, diaphoresis, appetite change and fatigue.  HENT: Positive for hearing loss.   Eyes: Negative.   Respiratory: Negative.  Negative for cough, choking, chest tightness, shortness of breath and stridor.   Cardiovascular: Negative.  Negative for chest pain, palpitations and leg swelling.  Gastrointestinal: Negative.  Negative for nausea, vomiting, abdominal pain, diarrhea, constipation and blood in stool.  Endocrine: Negative.   Genitourinary: Negative.  Negative for difficulty urinating.  Musculoskeletal: Positive for arthralgias (left elbow pain for 1 month, no injury). Negative for myalgias, back pain and joint swelling.  Skin: Negative.  Negative for rash.  Allergic/Immunologic: Negative.   Neurological: Negative.  Negative for dizziness and focal weakness.  Hematological: Negative.  Negative for adenopathy. Does not bruise/bleed easily.  Psychiatric/Behavioral: Negative.        Objective:   Physical Exam  Constitutional: He is oriented to person, place, and time. He appears well-developed and well-nourished. No distress.  HENT:  Head: Normocephalic and atraumatic.    Mouth/Throat: Oropharynx is clear and moist. No oropharyngeal exudate.  Eyes: Conjunctivae are normal. Right eye exhibits no discharge. Left eye exhibits no discharge. No scleral icterus.  Neck: Normal range of motion. Neck supple. No JVD present. No tracheal deviation present. No thyromegaly present.  Cardiovascular: Normal rate, regular rhythm, normal heart sounds and intact distal pulses.  Exam reveals no gallop and no friction rub.   No murmur heard. Pulmonary/Chest: Effort normal and breath sounds normal. No stridor. No respiratory distress. He has no wheezes. He has no rales. He exhibits no tenderness.  Abdominal: Soft. Bowel sounds are normal. He exhibits no distension and no mass. There is no tenderness. There is no rebound and no guarding. Hernia confirmed negative in the right inguinal area and confirmed negative in the left inguinal area.  Genitourinary: Rectum normal, prostate normal, testes normal and penis normal. Rectal exam shows no external hemorrhoid, no internal hemorrhoid, no fissure, no mass, no tenderness and anal tone normal. Guaiac negative stool. Prostate is not enlarged and not tender. Right testis shows no mass, no swelling and no tenderness. Right testis is descended. Left testis shows no mass, no swelling and no tenderness. Left testis is descended. Circumcised. No penile erythema or penile tenderness. No discharge found.  Musculoskeletal: Normal range of motion. He exhibits no edema.       Left elbow: He exhibits normal range of motion, no swelling, no effusion, no deformity and no laceration. Tenderness found. Lateral epicondyle tenderness noted. No radial head, no medial epicondyle and no olecranon process tenderness noted.  Lymphadenopathy:    He has no cervical adenopathy.       Right: No inguinal adenopathy present.       Left: No inguinal adenopathy present.  Neurological: He is oriented  to person, place, and time.  Skin: Skin is warm and dry. No rash noted. He is  not diaphoretic. No erythema. No pallor.  Vitals reviewed.   Lab Results  Component Value Date   WBC 5.7 10/04/2014   HGB 14.6 10/04/2014   HCT 43.0 10/04/2014   PLT 203.0 10/04/2014   GLUCOSE 101* 10/04/2014   CHOL 261* 10/04/2014   TRIG 65.0 10/04/2014   HDL 50.90 10/04/2014   LDLDIRECT 206.6 08/04/2013   LDLCALC 197* 10/04/2014   ALT 36 10/04/2014   AST 28 10/04/2014   NA 139 10/04/2014   K 4.8 10/04/2014   CL 104 10/04/2014   CREATININE 1.06 10/04/2014   BUN 22 10/04/2014   CO2 29 10/04/2014   TSH 2.51 10/04/2014   PSA 1.61 10/04/2014   INR 1.0 07/02/2008        Assessment & Plan:

## 2014-10-04 NOTE — Assessment & Plan Note (Signed)
He will rest and ice Will start nsaids He was given pt ed material about this

## 2014-10-04 NOTE — Patient Instructions (Signed)
Lateral Epicondylitis (Tennis Elbow) with Rehab Lateral epicondylitis involves inflammation and pain around the outer portion of the elbow. The pain is caused by inflammation of the tendons in the forearm that bring back (extend) the wrist. Lateral epicondylitis is also called tennis elbow, because it is very common in tennis players. However, it may occur in any individual who extends the wrist repetitively. If lateral epicondylitis is left untreated, it may become a chronic problem. SYMPTOMS   Pain, tenderness, and inflammation on the outer (lateral) side of the elbow.  Pain or weakness with gripping activities.  Pain that increases with wrist-twisting motions (playing tennis, using a screwdriver, opening a door or a jar).  Pain with lifting objects, including a coffee cup. CAUSES  Lateral epicondylitis is caused by inflammation of the tendons that extend the wrist. Causes of injury may include:  Repetitive stress and strain on the muscles and tendons that extend the wrist.  Sudden change in activity level or intensity.  Incorrect grip in racquet sports.  Incorrect grip size of racquet (often too large).  Incorrect hitting position or technique (usually backhand, leading with the elbow).  Using a racket that is too heavy. RISK INCREASES WITH:  Sports or occupations that require repetitive and/or strenuous forearm and wrist movements (tennis, squash, racquetball, carpentry).  Poor wrist and forearm strength and flexibility.  Failure to warm up properly before activity.  Resuming activity before healing, rehabilitation, and conditioning are complete. PREVENTION   Warm up and stretch properly before activity.  Maintain physical fitness:  Strength, flexibility, and endurance.  Cardiovascular fitness.  Wear and use properly fitted equipment.  Learn and use proper technique and have a coach correct improper technique.  Wear a tennis elbow (counterforce) brace. PROGNOSIS    The course of this condition depends on the degree of the injury. If treated properly, acute cases (symptoms lasting less than 4 weeks) are often resolved in 2 to 6 weeks. Chronic (longer lasting cases) often resolve in 3 to 6 months but may require physical therapy. RELATED COMPLICATIONS   Frequently recurring symptoms, resulting in a chronic problem. Properly treating the problem the first time decreases frequency of recurrence.  Chronic inflammation, scarring tendon degeneration, and partial tendon tear, requiring surgery.  Delayed healing or resolution of symptoms. TREATMENT  Treatment first involves the use of ice and medicine to reduce pain and inflammation. Strengthening and stretching exercises may help reduce discomfort if performed regularly. These exercises may be performed at home if the condition is an acute injury. Chronic cases may require a referral to a physical therapist for evaluation and treatment. Your caregiver may advise a corticosteroid injection to help reduce inflammation. Rarely, surgery is needed. MEDICATION  If pain medicine is needed, nonsteroidal anti-inflammatory medicines (aspirin and ibuprofen), or other minor pain relievers (acetaminophen), are often advised.  Do not take pain medicine for 7 days before surgery.  Prescription pain relievers may be given, if your caregiver thinks they are needed. Use only as directed and only as much as you need.  Corticosteroid injections may be recommended. These injections should be reserved only for the most severe cases, because they can only be given a certain number of times. HEAT AND COLD  Cold treatment (icing) should be applied for 10 to 15 minutes every 2 to 3 hours for inflammation and pain, and immediately after activity that aggravates your symptoms. Use ice packs or an ice massage.  Heat treatment may be used before performing stretching and strengthening activities prescribed by your   caregiver, physical  therapist, or athletic trainer. Use a heat pack or a warm water soak. SEEK MEDICAL CARE IF: Symptoms get worse or do not improve in 2 weeks, despite treatment. EXERCISES  RANGE OF MOTION (ROM) AND STRETCHING EXERCISES - Epicondylitis, Lateral (Tennis Elbow) These exercises may help you when beginning to rehabilitate your injury. Your symptoms may go away with or without further involvement from your physician, physical therapist, or athletic trainer. While completing these exercises, remember:   Restoring tissue flexibility helps normal motion to return to the joints. This allows healthier, less painful movement and activity.  An effective stretch should be held for at least 30 seconds.  A stretch should never be painful. You should only feel a gentle lengthening or release in the stretched tissue. RANGE OF MOTION - Wrist Flexion, Active-Assisted  Extend your right / left elbow with your fingers pointing down.*  Gently pull the back of your hand towards you, until you feel a gentle stretch on the top of your forearm.  Hold this position for __________ seconds. Repeat __________ times. Complete this exercise __________ times per day.  *If directed by your physician, physical therapist or athletic trainer, complete this stretch with your elbow bent, rather than extended. RANGE OF MOTION - Wrist Extension, Active-Assisted  Extend your right / left elbow and turn your palm upwards.*  Gently pull your palm and fingertips back, so your wrist extends and your fingers point more toward the ground.  You should feel a gentle stretch on the inside of your forearm.  Hold this position for __________ seconds. Repeat __________ times. Complete this exercise __________ times per day. *If directed by your physician, physical therapist or athletic trainer, complete this stretch with your elbow bent, rather than extended. STRETCH - Wrist Flexion  Place the back of your right / left hand on a  tabletop, leaving your elbow slightly bent. Your fingers should point away from your body.  Gently press the back of your hand down onto the table by straightening your elbow. You should feel a stretch on the top of your forearm.  Hold this position for __________ seconds. Repeat __________ times. Complete this stretch __________ times per day.  STRETCH - Wrist Extension   Place your right / left fingertips on a tabletop, leaving your elbow slightly bent. Your fingers should point backwards.  Gently press your fingers and palm down onto the table by straightening your elbow. You should feel a stretch on the inside of your forearm.  Hold this position for __________ seconds. Repeat __________ times. Complete this stretch __________ times per day.  STRENGTHENING EXERCISES - Epicondylitis, Lateral (Tennis Elbow) These exercises may help you when beginning to rehabilitate your injury. They may resolve your symptoms with or without further involvement from your physician, physical therapist, or athletic trainer. While completing these exercises, remember:   Muscles can gain both the endurance and the strength needed for everyday activities through controlled exercises.  Complete these exercises as instructed by your physician, physical therapist or athletic trainer. Increase the resistance and repetitions only as guided.  You may experience muscle soreness or fatigue, but the pain or discomfort you are trying to eliminate should never worsen during these exercises. If this pain does get worse, stop and make sure you are following the directions exactly. If the pain is still present after adjustments, discontinue the exercise until you can discuss the trouble with your caregiver. STRENGTH - Wrist Flexors  Sit with your right / left forearm palm-up   and fully supported on a table or countertop. Your elbow should be resting below the height of your shoulder. Allow your wrist to extend over the edge of  the surface.  Loosely holding a __________ weight, or a piece of rubber exercise band or tubing, slowly curl your hand up toward your forearm.  Hold this position for __________ seconds. Slowly lower the wrist back to the starting position in a controlled manner. Repeat __________ times. Complete this exercise __________ times per day.  STRENGTH - Wrist Extensors  Sit with your right / left forearm palm-down and fully supported on a table or countertop. Your elbow should be resting below the height of your shoulder. Allow your wrist to extend over the edge of the surface.  Loosely holding a __________ weight, or a piece of rubber exercise band or tubing, slowly curl your hand up toward your forearm.  Hold this position for __________ seconds. Slowly lower the wrist back to the starting position in a controlled manner. Repeat __________ times. Complete this exercise __________ times per day.  STRENGTH - Ulnar Deviators  Stand with a ____________________ weight in your right / left hand, or sit while holding a rubber exercise band or tubing, with your healthy arm supported on a table or countertop.  Move your wrist, so that your pinkie travels toward your forearm and your thumb moves away from your forearm.  Hold this position for __________ seconds and then slowly lower the wrist back to the starting position. Repeat __________ times. Complete this exercise __________ times per day STRENGTH - Radial Deviators  Stand with a ____________________ weight in your right / left hand, or sit while holding a rubber exercise band or tubing, with your injured arm supported on a table or countertop.  Raise your hand upward in front of you or pull up on the rubber tubing.  Hold this position for __________ seconds and then slowly lower the wrist back to the starting position. Repeat __________ times. Complete this exercise __________ times per day. STRENGTH - Forearm Supinators   Sit with your  right / left forearm supported on a table, keeping your elbow below shoulder height. Rest your hand over the edge, palm down.  Gently grip a hammer or a soup ladle.  Without moving your elbow, slowly turn your palm and hand upward to a "thumbs-up" position.  Hold this position for __________ seconds. Slowly return to the starting position. Repeat __________ times. Complete this exercise __________ times per day.  STRENGTH - Forearm Pronators   Sit with your right / left forearm supported on a table, keeping your elbow below shoulder height. Rest your hand over the edge, palm up.  Gently grip a hammer or a soup ladle.  Without moving your elbow, slowly turn your palm and hand upward to a "thumbs-up" position.  Hold this position for __________ seconds. Slowly return to the starting position. Repeat __________ times. Complete this exercise __________ times per day.  STRENGTH - Grip  Grasp a tennis ball, a dense sponge, or a large, rolled sock in your hand.  Squeeze as hard as you can, without increasing any pain.  Hold this position for __________ seconds. Release your grip slowly. Repeat __________ times. Complete this exercise __________ times per day.  STRENGTH - Elbow Extensors, Isometric  Stand or sit upright, on a firm surface. Place your right / left arm so that your palm faces your stomach, and it is at the height of your waist.  Place your opposite hand   on the underside of your forearm. Gently push up as your right / left arm resists. Push as hard as you can with both arms, without causing any pain or movement at your right / left elbow. Hold this stationary position for __________ seconds. Gradually release the tension in both arms. Allow your muscles to relax completely before repeating. Document Released: 08/03/2005 Document Revised: 12/18/2013 Document Reviewed: 11/15/2008 [facility] Patient Information 2015 [place], [place]. This information is not intended to replace advice  given to you by your health care provider. Make sure you discuss any questions you have with your health care provider. Health Maintenance A healthy lifestyle and preventative care can promote health and wellness.  Maintain regular health, dental, and eye exams.  Eat a healthy diet. Foods like vegetables, fruits, whole grains, low-fat dairy products, and lean protein foods contain the nutrients you need and are low in calories. Decrease your intake of foods high in solid fats, added sugars, and salt. Get information about a proper diet from your health care provider, if necessary.  Regular physical exercise is one of the most important things you can do for your health. Most adults should get at least 150 minutes of moderate-intensity exercise (any activity that increases your heart rate and causes you to sweat) each week. In addition, most adults need muscle-strengthening exercises on 2 or more days a week.   Maintain a healthy weight. The body mass index (BMI) is a screening tool to identify possible weight problems. It provides an estimate of body fat based on height and weight. Your health care provider can find your BMI and can help you achieve or maintain a healthy weight. For males 20 years and older:  A BMI below 18.5 is considered underweight.  A BMI of 18.5 to 24.9 is normal.  A BMI of 25 to 29.9 is considered overweight.  A BMI of 30 and above is considered obese.  Maintain normal blood lipids and cholesterol by exercising and minimizing your intake of saturated fat. Eat a balanced diet with plenty of fruits and vegetables. Blood tests for lipids and cholesterol should begin at age 74 and be repeated every 5 years. If your lipid or cholesterol levels are high, you are over age 67, or you are at high risk for heart disease, you may need your cholesterol levels checked more frequently.Ongoing high lipid and cholesterol levels should be treated with medicines if diet and exercise are  not working.  If you smoke, find out from your health care provider how to quit. If you do not use tobacco, do not start.  Lung cancer screening is recommended for adults aged 53-80 years who are at high risk for developing lung cancer because of a history of smoking. A yearly low-dose CT scan of the lungs is recommended for people who have at least a 30-pack-year history of smoking and are current smokers or have quit within the past 15 years. A pack year of smoking is smoking an average of 1 pack of cigarettes a day for 1 year (for example, a 30-pack-year history of smoking could mean smoking 1 pack a day for 30 years or 2 packs a day for 15 years). Yearly screening should continue until the smoker has stopped smoking for at least 15 years. Yearly screening should be stopped for people who develop a health problem that would prevent them from having lung cancer treatment.  If you choose to drink alcohol, do not have more than 2 drinks per day. One drink  is considered to be 12 oz (360 mL) of beer, 5 oz (150 mL) of wine, or 1.5 oz (45 mL) of liquor.  Avoid the use of street drugs. Do not share needles with anyone. Ask for help if you need support or instructions about stopping the use of drugs.  High blood pressure causes heart disease and increases the risk of stroke. Blood pressure should be checked at least every 1-2 years. Ongoing high blood pressure should be treated with medicines if weight loss and exercise are not effective.  If you are 55-60 years old, ask your health care provider if you should take aspirin to prevent heart disease.  Diabetes screening involves taking a blood sample to check your fasting blood sugar level. This should be done once every 3 years after age 58 if you are at a normal weight and without risk factors for diabetes. Testing should be considered at a younger age or be carried out more frequently if you are overweight and have at least 1 risk factor for  diabetes.  Colorectal cancer can be detected and often prevented. Most routine colorectal cancer screening begins at the age of 9 and continues through age 55. However, your health care provider may recommend screening at an earlier age if you have risk factors for colon cancer. On a yearly basis, your health care provider may provide home test kits to check for hidden blood in the stool. A small camera at the end of a tube may be used to directly examine the colon (sigmoidoscopy or colonoscopy) to detect the earliest forms of colorectal cancer. Talk to your health care provider about this at age 47 when routine screening begins. A direct exam of the colon should be repeated every 5-10 years through age 40, unless early forms of precancerous polyps or small growths are found.  People who are at an increased risk for hepatitis B should be screened for this virus. You are considered at high risk for hepatitis B if:  You were born in a country where hepatitis B occurs often. Talk with your health care provider about which countries are considered high risk.  Your parents were born in a high-risk country and you have not received a shot to protect against hepatitis B (hepatitis B vaccine).  You have HIV or AIDS.  You use needles to inject street drugs.  You live with, or have sex with, someone who has hepatitis B.  You are a man who has sex with other men (MSM).  You get hemodialysis treatment.  You take certain medicines for conditions like cancer, organ transplantation, and autoimmune conditions.  Hepatitis C blood testing is recommended for all people born from [birth year] through 1966 and any individual with known risk factors for hepatitis C.  Healthy men should no longer receive prostate-specific antigen (PSA) blood tests as part of routine cancer screening. Talk to your health care provider about prostate cancer screening.  Testicular cancer screening is not recommended for adolescents or  adult males who have no symptoms. Screening includes self-exam, a health care provider exam, and other screening tests. Consult with your health care provider about any symptoms you have or any concerns you have about testicular cancer.  Practice safe sex. Use condoms and avoid high-risk sexual practices to reduce the spread of sexually transmitted infections (STIs).  You should be screened for STIs, including gonorrhea and chlamydia if:  You are sexually active and are younger than 24 years.  You are older than 24 years, and  your health care provider tells you that you are at risk for this type of infection.  Your sexual activity has changed since you were last screened, and you are at an increased risk for chlamydia or gonorrhea. Ask your health care provider if you are at risk.  If you are at risk of being infected with HIV, it is recommended that you take a prescription medicine daily to prevent HIV infection. This is called pre-exposure prophylaxis (PrEP). You are considered at risk if:  You are a man who has sex with other men (MSM).  You are a heterosexual man who is sexually active with multiple partners.  You take drugs by injection.  You are sexually active with a partner who has HIV.  Talk with your health care provider about whether you are at high risk of being infected with HIV. If you choose to begin PrEP, you should first be tested for HIV. You should then be tested every 3 months for as long as you are taking PrEP.  Use sunscreen. Apply sunscreen liberally and repeatedly throughout the day. You should seek shade when your shadow is shorter than you. Protect yourself by wearing long sleeves, pants, a wide-brimmed hat, and sunglasses year round whenever you are outdoors.  Tell your health care provider of new moles or changes in moles, especially if there is a change in shape or color. Also, tell your health care provider if a mole is larger than the size of a pencil  eraser.  A one-time screening for abdominal aortic aneurysm (AAA) and surgical repair of large AAAs by ultrasound is recommended for men aged 58-75 years who are current or former smokers.  Stay current with your vaccines (immunizations). Document Released: 01/30/2008 Document Revised: 08/08/2013 Document Reviewed: 12/29/2010 [facility] Patient Information 2015 [place], [place]. This information is not intended to replace advice given to you by your health care provider. Make sure you discuss any questions you have with your health care provider.

## 2014-10-04 NOTE — Assessment & Plan Note (Signed)
He has not achieved his LDL goal but is not willing to start a statin He will cont to work on his lifestyle modifications

## 2014-10-04 NOTE — Progress Notes (Signed)
Pre visit review using our clinic review tool, if applicable. No additional management support is needed unless otherwise documented below in the visit note. 

## 2014-10-07 ENCOUNTER — Encounter: Payer: Self-pay | Admitting: Internal Medicine

## 2014-11-28 DIAGNOSIS — H903 Sensorineural hearing loss, bilateral: Secondary | ICD-10-CM | POA: Diagnosis not present

## 2014-12-15 DIAGNOSIS — J0140 Acute pansinusitis, unspecified: Secondary | ICD-10-CM | POA: Diagnosis not present

## 2014-12-22 DIAGNOSIS — H1030 Unspecified acute conjunctivitis, unspecified eye: Secondary | ICD-10-CM | POA: Diagnosis not present

## 2015-01-30 DIAGNOSIS — L309 Dermatitis, unspecified: Secondary | ICD-10-CM | POA: Diagnosis not present

## 2015-01-30 DIAGNOSIS — L570 Actinic keratosis: Secondary | ICD-10-CM | POA: Diagnosis not present

## 2015-01-30 DIAGNOSIS — L821 Other seborrheic keratosis: Secondary | ICD-10-CM | POA: Diagnosis not present

## 2015-01-30 DIAGNOSIS — L919 Hypertrophic disorder of the skin, unspecified: Secondary | ICD-10-CM | POA: Diagnosis not present

## 2015-09-11 DIAGNOSIS — R030 Elevated blood-pressure reading, without diagnosis of hypertension: Secondary | ICD-10-CM | POA: Diagnosis not present

## 2015-09-11 DIAGNOSIS — H10022 Other mucopurulent conjunctivitis, left eye: Secondary | ICD-10-CM | POA: Diagnosis not present

## 2015-09-14 DIAGNOSIS — J018 Other acute sinusitis: Secondary | ICD-10-CM | POA: Diagnosis not present

## 2015-09-23 DIAGNOSIS — H10812 Pingueculitis, left eye: Secondary | ICD-10-CM | POA: Diagnosis not present

## 2015-10-07 ENCOUNTER — Other Ambulatory Visit (INDEPENDENT_AMBULATORY_CARE_PROVIDER_SITE_OTHER): Payer: Medicare Other

## 2015-10-07 ENCOUNTER — Encounter: Payer: Self-pay | Admitting: Internal Medicine

## 2015-10-07 ENCOUNTER — Ambulatory Visit (INDEPENDENT_AMBULATORY_CARE_PROVIDER_SITE_OTHER): Payer: Medicare Other | Admitting: Internal Medicine

## 2015-10-07 VITALS — BP 122/68 | HR 67 | Temp 98.5°F | Resp 16 | Ht 72.0 in | Wt 207.0 lb

## 2015-10-07 DIAGNOSIS — Z23 Encounter for immunization: Secondary | ICD-10-CM

## 2015-10-07 DIAGNOSIS — R351 Nocturia: Secondary | ICD-10-CM | POA: Diagnosis not present

## 2015-10-07 DIAGNOSIS — E785 Hyperlipidemia, unspecified: Secondary | ICD-10-CM | POA: Diagnosis not present

## 2015-10-07 DIAGNOSIS — N401 Enlarged prostate with lower urinary tract symptoms: Secondary | ICD-10-CM | POA: Diagnosis not present

## 2015-10-07 DIAGNOSIS — N400 Enlarged prostate without lower urinary tract symptoms: Secondary | ICD-10-CM | POA: Insufficient documentation

## 2015-10-07 DIAGNOSIS — Z0000 Encounter for general adult medical examination without abnormal findings: Secondary | ICD-10-CM | POA: Diagnosis not present

## 2015-10-07 LAB — COMPREHENSIVE METABOLIC PANEL
ALT: 24 U/L (ref 0–53)
AST: 20 U/L (ref 0–37)
Albumin: 4.4 g/dL (ref 3.5–5.2)
Alkaline Phosphatase: 58 U/L (ref 39–117)
BUN: 19 mg/dL (ref 6–23)
CHLORIDE: 103 meq/L (ref 96–112)
CO2: 29 meq/L (ref 19–32)
CREATININE: 0.92 mg/dL (ref 0.40–1.50)
Calcium: 9.7 mg/dL (ref 8.4–10.5)
GFR: 87.53 mL/min (ref >60.00)
Glucose, Bld: 85 mg/dL (ref 70–99)
Potassium: 4.1 mEq/L (ref 3.5–5.1)
SODIUM: 140 meq/L (ref 135–145)
Total Bilirubin: 0.4 mg/dL (ref 0.2–1.2)
Total Protein: 6.7 g/dL (ref 6.0–8.3)

## 2015-10-07 LAB — LIPID PANEL
CHOL/HDL RATIO: 5
Cholesterol: 218 mg/dL — ABNORMAL HIGH (ref 0–200)
HDL: 48.4 mg/dL (ref >39.00)
LDL Cholesterol: 132 mg/dL — ABNORMAL HIGH (ref 0–99)
NONHDL: 169.46
Triglycerides: 185 mg/dL — ABNORMAL HIGH (ref 0.0–149.0)
VLDL: 37 mg/dL (ref 0.0–40.0)

## 2015-10-07 LAB — CBC WITH DIFFERENTIAL/PLATELET
BASOS PCT: 0.6 % (ref 0.0–3.0)
Basophils Absolute: 0 10*3/uL (ref 0.0–0.1)
EOS ABS: 0.2 10*3/uL (ref 0.0–0.7)
Eosinophils Relative: 3.1 % (ref 0.0–5.0)
HEMATOCRIT: 41.3 % (ref 39.0–52.0)
Hemoglobin: 14.3 g/dL (ref 13.0–17.0)
LYMPHS ABS: 1.7 10*3/uL (ref 0.7–4.0)
Lymphocytes Relative: 25.1 % (ref 12.0–46.0)
MCHC: 34.5 g/dL (ref 30.0–36.0)
MCV: 91.2 fl (ref 78.0–100.0)
MONO ABS: 0.5 10*3/uL (ref 0.1–1.0)
Monocytes Relative: 7.8 % (ref 3.0–12.0)
NEUTROS ABS: 4.2 10*3/uL (ref 1.4–7.7)
Neutrophils Relative %: 63.4 % (ref 43.0–77.0)
PLATELETS: 198 10*3/uL (ref 150.0–400.0)
RBC: 4.53 Mil/uL (ref 4.22–5.81)
RDW: 13.3 % (ref 11.5–15.5)
WBC: 6.6 10*3/uL (ref 4.0–10.5)

## 2015-10-07 LAB — URINALYSIS, ROUTINE W REFLEX MICROSCOPIC
BILIRUBIN URINE: NEGATIVE
HGB URINE DIPSTICK: NEGATIVE
KETONES UR: NEGATIVE
LEUKOCYTES UA: NEGATIVE
Nitrite: NEGATIVE
PH: 6 (ref 5.0–8.0)
Specific Gravity, Urine: 1.025 (ref 1.000–1.030)
TOTAL PROTEIN, URINE-UPE24: NEGATIVE
URINE GLUCOSE: NEGATIVE
UROBILINOGEN UA: 0.2 (ref 0.0–1.0)

## 2015-10-07 LAB — TSH: TSH: 1.13 u[IU]/mL (ref 0.35–4.50)

## 2015-10-07 LAB — PSA: PSA: 1.55 ng/mL (ref 0.10–4.00)

## 2015-10-07 LAB — FECAL OCCULT BLOOD, GUAIAC: FECAL OCCULT BLD: NEGATIVE

## 2015-10-07 NOTE — Progress Notes (Signed)
Pre visit review using our clinic review tool, if applicable. No additional management support is needed unless otherwise documented below in the visit note. 

## 2015-10-07 NOTE — Patient Instructions (Signed)

## 2015-10-09 NOTE — Progress Notes (Signed)
Subjective:  Patient ID: Ronald Franklin, male    DOB: Dec 30, 1950  Age: 65 y.o. MRN: [id number]  CC: Hyperlipidemia; Annual Exam; and Benign Prostatic Hypertrophy   HPI Ronald Franklin presents for a complete physical as well as follow-up on hypercholesterolemia and BPH. To date he has decided not to take a statin in spite of the fact that he has a slightly elevated Framingham risk score. Regarding the enlarged prostate he has rare nocturia and no other symptoms so he has requested not to take a medication for symptom relief. He is very active physically and denies any episodes of shortness of breath, dyspnea on exertion, fatigue, or chest pain. He has lost about 6 pounds with lifestyle modifications.  Outpatient Prescriptions Prior to Visit  Medication Sig Dispense Refill  . Ascorbic Acid (VITAMIN C) 1000 MG tablet Take 1,000 mg by mouth daily.     Marland Kitchen aspirin 81 MG tablet Take 81 mg by mouth daily.     . cholecalciferol (VITAMIN D) 1000 UNITS tablet Take 1,000 Units by mouth daily.     . Cyanocobalamin (VITAMIN B 12 PO) Take 1 tablet by mouth daily.    Marland Kitchen glucosamine-chondroitin 500-400 MG tablet Take 1 tablet by mouth daily.     . Multiple Vitamins-Minerals (CENTRUM SILVER PO) Take 1 tablet by mouth daily.     . NON FORMULARY Take 1 capsule by mouth daily. muscadine seed 650 mg    . vitamin E 800 UNIT capsule Take 800 Units by mouth daily.     . Diclofenac (ZORVOLEX) 35 MG CAPS Take 1 capsule by mouth 3 (three) times daily with meals as needed. (Patient not taking: Reported on 10/07/2015) 90 capsule 1   No facility-administered medications prior to visit.    ROS Review of Systems  Constitutional: Negative.  Negative for fever, chills, diaphoresis, appetite change and fatigue.  HENT: Negative.   Eyes: Negative.   Respiratory: Negative.  Negative for cough, choking, chest tightness, shortness of breath and stridor.   Cardiovascular: Negative.  Negative for chest pain, palpitations and  leg swelling.  Gastrointestinal: Negative.  Negative for nausea, vomiting, abdominal pain, diarrhea and constipation.  Endocrine: Negative.   Genitourinary: Negative.  Negative for dysuria, urgency, frequency, hematuria, decreased urine volume and difficulty urinating.  Musculoskeletal: Negative.  Negative for myalgias, back pain, arthralgias and neck pain.  Skin: Negative.  Negative for color change and rash.  Allergic/Immunologic: Negative.   Neurological: Negative.  Negative for dizziness, tremors, weakness, light-headedness and numbness.  Hematological: Negative.  Negative for adenopathy. Does not bruise/bleed easily.  Psychiatric/Behavioral: Negative.     Objective:  BP 122/68 mmHg  Pulse 67  Temp(Src) 98.5 F (36.9 C) (Oral)  Resp 16  Ht 6' (1.829 m)  Wt 207 lb (93.895 kg)  BMI 28.07 kg/m2  SpO2 97%  BP Readings from Last 3 Encounters:  10/07/15 122/68  10/04/14 126/80  08/04/13 122/74    Wt Readings from Last 3 Encounters:  10/07/15 207 lb (93.895 kg)  10/04/14 213 lb (96.616 kg)  08/04/13 215 lb (97.523 kg)    Physical Exam  Constitutional: He is oriented to person, place, and time. He appears well-developed and well-nourished. No distress.  HENT:  Head: Normocephalic and atraumatic.  Mouth/Throat: Oropharynx is clear and moist. No oropharyngeal exudate.  Eyes: Conjunctivae are normal. Right eye exhibits no discharge. Left eye exhibits no discharge. No scleral icterus.  Neck: Normal range of motion. Neck supple. No JVD present. No tracheal deviation present. No  thyromegaly present.  Cardiovascular: Normal rate, regular rhythm, normal heart sounds and intact distal pulses.  Exam reveals no gallop and no friction rub.   No murmur heard. Pulmonary/Chest: Effort normal and breath sounds normal. No stridor. No respiratory distress. He has no wheezes. He has no rales. He exhibits no tenderness.  Abdominal: Soft. Bowel sounds are normal. He exhibits no distension and no  mass. There is no tenderness. There is no rebound and no guarding. Hernia confirmed negative in the right inguinal area and confirmed negative in the left inguinal area.  Genitourinary: Rectum normal, testes normal and penis normal. Rectal exam shows no external hemorrhoid, no internal hemorrhoid, no fissure, no mass, no tenderness and anal tone normal. Guaiac negative stool. Prostate is enlarged (1+ smooth symm BPH). Prostate is not tender. Right testis shows no mass, no swelling and no tenderness. Right testis is descended. Left testis shows no swelling and no tenderness. Left testis is descended. Circumcised. No penile erythema or penile tenderness. No discharge found.  Musculoskeletal: Normal range of motion. He exhibits no edema or tenderness.  Lymphadenopathy:    He has no cervical adenopathy.       Right: No inguinal adenopathy present.       Left: No inguinal adenopathy present.  Neurological: He is oriented to person, place, and time.  Skin: Skin is warm and dry. No rash noted. He is not diaphoretic. No erythema. No pallor.  Vitals reviewed.   Lab Results  Component Value Date   WBC 6.6 10/07/2015   HGB 14.3 10/07/2015   HCT 41.3 10/07/2015   PLT 198.0 10/07/2015   GLUCOSE 85 10/07/2015   CHOL 218* 10/07/2015   TRIG 185.0* 10/07/2015   HDL 48.40 10/07/2015   LDLDIRECT 206.6 08/04/2013   LDLCALC 132* 10/07/2015   ALT 24 10/07/2015   AST 20 10/07/2015   NA 140 10/07/2015   K 4.1 10/07/2015   CL 103 10/07/2015   CREATININE 0.92 10/07/2015   BUN 19 10/07/2015   CO2 29 10/07/2015   TSH 1.13 10/07/2015   PSA 1.55 10/07/2015   INR 1.0 07/02/2008    No results found.  Assessment & Plan:   Ronald Franklin was seen today for hyperlipidemia, annual exam and benign prostatic hypertrophy.  Diagnoses and all orders for this visit:  Need for Tdap vaccination -     Tdap vaccine greater than or equal to 7yo IM  Need for vaccination with 13-polyvalent pneumococcal conjugate vaccine -      Pneumococcal conjugate vaccine 13-valent  Hyperlipidemia with target LDL less than 160- his Framingham risk score is 12%, he has not committed to taking a statin at this time. -     Lipid panel; Future -     TSH; Future -     Comprehensive metabolic panel; Future -     CBC with Differential/Platelet; Future  Benign prostatic hypertrophy (BPH) with nocturia- his exam and PSA are not suspicious for prostate cancer, he has no symptoms that need to be treated, will continue to monitor and treated his request. -     Urinalysis, Routine w reflex microscopic (not at [facility]); Future -     PSA; Future  Routine general medical examination at a health care facility   I have discontinued Mr. Bomba's Diclofenac. I am also having him maintain his aspirin, vitamin E, Multiple Vitamins-Minerals (CENTRUM SILVER PO), glucosamine-chondroitin, cholecalciferol, vitamin C, Cyanocobalamin (VITAMIN B 12 PO), and NON FORMULARY.  No orders of the defined types were placed in this  encounter.   See AVS for instructions about healthy living and anticipatory guidance.  Follow-up: Return in about 6 months (around 04/05/2016).  Scarlette Calico, MD

## 2015-10-09 NOTE — Assessment & Plan Note (Signed)

## 2016-01-06 DIAGNOSIS — L814 Other melanin hyperpigmentation: Secondary | ICD-10-CM | POA: Diagnosis not present

## 2016-01-06 DIAGNOSIS — D1801 Hemangioma of skin and subcutaneous tissue: Secondary | ICD-10-CM | POA: Diagnosis not present

## 2016-01-06 DIAGNOSIS — L821 Other seborrheic keratosis: Secondary | ICD-10-CM | POA: Diagnosis not present

## 2016-01-06 DIAGNOSIS — L570 Actinic keratosis: Secondary | ICD-10-CM | POA: Diagnosis not present

## 2016-02-06 DIAGNOSIS — H43811 Vitreous degeneration, right eye: Secondary | ICD-10-CM | POA: Diagnosis not present

## 2016-03-09 DIAGNOSIS — H43811 Vitreous degeneration, right eye: Secondary | ICD-10-CM | POA: Diagnosis not present

## 2016-03-09 DIAGNOSIS — H2513 Age-related nuclear cataract, bilateral: Secondary | ICD-10-CM | POA: Diagnosis not present

## 2016-05-11 DIAGNOSIS — Z23 Encounter for immunization: Secondary | ICD-10-CM | POA: Diagnosis not present

## 2016-06-30 DIAGNOSIS — L570 Actinic keratosis: Secondary | ICD-10-CM | POA: Diagnosis not present

## 2016-06-30 DIAGNOSIS — L918 Other hypertrophic disorders of the skin: Secondary | ICD-10-CM | POA: Diagnosis not present

## 2016-06-30 DIAGNOSIS — L814 Other melanin hyperpigmentation: Secondary | ICD-10-CM | POA: Diagnosis not present

## 2016-10-22 ENCOUNTER — Ambulatory Visit (INDEPENDENT_AMBULATORY_CARE_PROVIDER_SITE_OTHER)
Admission: RE | Admit: 2016-10-22 | Discharge: 2016-10-22 | Disposition: A | Discharge status: Home / Self Care | Payer: Medicare Other | Source: Ambulatory Visit | Attending: Internal Medicine | Admitting: Internal Medicine

## 2016-10-22 ENCOUNTER — Encounter: Payer: Self-pay | Admitting: Internal Medicine

## 2016-10-22 ENCOUNTER — Other Ambulatory Visit: Payer: Self-pay | Admitting: Internal Medicine

## 2016-10-22 ENCOUNTER — Other Ambulatory Visit (INDEPENDENT_AMBULATORY_CARE_PROVIDER_SITE_OTHER): Payer: Medicare Other

## 2016-10-22 ENCOUNTER — Ambulatory Visit (INDEPENDENT_AMBULATORY_CARE_PROVIDER_SITE_OTHER): Payer: Medicare Other | Admitting: Internal Medicine

## 2016-10-22 VITALS — BP 132/80 | HR 99 | Temp 98.5°F | Ht 72.0 in | Wt 212.8 lb

## 2016-10-22 DIAGNOSIS — M1612 Unilateral primary osteoarthritis, left hip: Secondary | ICD-10-CM | POA: Diagnosis not present

## 2016-10-22 DIAGNOSIS — G8929 Other chronic pain: Secondary | ICD-10-CM | POA: Diagnosis not present

## 2016-10-22 DIAGNOSIS — Z0000 Encounter for general adult medical examination without abnormal findings: Secondary | ICD-10-CM | POA: Diagnosis not present

## 2016-10-22 DIAGNOSIS — M25552 Pain in left hip: Secondary | ICD-10-CM

## 2016-10-22 DIAGNOSIS — N400 Enlarged prostate without lower urinary tract symptoms: Secondary | ICD-10-CM | POA: Diagnosis not present

## 2016-10-22 DIAGNOSIS — Z23 Encounter for immunization: Secondary | ICD-10-CM | POA: Diagnosis not present

## 2016-10-22 DIAGNOSIS — E785 Hyperlipidemia, unspecified: Secondary | ICD-10-CM | POA: Diagnosis not present

## 2016-10-22 LAB — CBC WITH DIFFERENTIAL/PLATELET
Basophils Absolute: 0 10*3/uL (ref 0.0–0.1)
Basophils Relative: 0.6 % (ref 0.0–3.0)
EOS PCT: 2.8 % (ref 0.0–5.0)
Eosinophils Absolute: 0.2 10*3/uL (ref 0.0–0.7)
HEMATOCRIT: 43.8 % (ref 39.0–52.0)
Hemoglobin: 14.7 g/dL (ref 13.0–17.0)
LYMPHS ABS: 2.4 10*3/uL (ref 0.7–4.0)
Lymphocytes Relative: 29 % (ref 12.0–46.0)
MCHC: 33.5 g/dL (ref 30.0–36.0)
MCV: 92.3 fl (ref 78.0–100.0)
MONOS PCT: 8.6 % (ref 3.0–12.0)
Monocytes Absolute: 0.7 10*3/uL (ref 0.1–1.0)
NEUTROS PCT: 59 % (ref 43.0–77.0)
Neutro Abs: 4.9 10*3/uL (ref 1.4–7.7)
PLATELETS: 189 10*3/uL (ref 150.0–400.0)
RBC: 4.74 Mil/uL (ref 4.22–5.81)
RDW: 13.1 % (ref 11.5–15.5)
WBC: 8.2 10*3/uL (ref 4.0–10.5)

## 2016-10-22 LAB — COMPREHENSIVE METABOLIC PANEL
ALT: 28 U/L (ref 0–53)
AST: 20 U/L (ref 0–37)
Albumin: 4.4 g/dL (ref 3.5–5.2)
Alkaline Phosphatase: 63 U/L (ref 39–117)
BUN: 18 mg/dL (ref 6–23)
CHLORIDE: 103 meq/L (ref 96–112)
CO2: 30 mEq/L (ref 19–32)
Calcium: 10.2 mg/dL (ref 8.4–10.5)
Creatinine, Ser: 0.97 mg/dL (ref 0.40–1.50)
GFR: 82.09 mL/min (ref >60.00)
GLUCOSE: 87 mg/dL (ref 70–99)
Potassium: 4.6 mEq/L (ref 3.5–5.1)
Sodium: 137 mEq/L (ref 135–145)
Total Bilirubin: 0.4 mg/dL (ref 0.2–1.2)
Total Protein: 7 g/dL (ref 6.0–8.3)

## 2016-10-22 LAB — TSH: TSH: 2.62 u[IU]/mL (ref 0.35–4.50)

## 2016-10-22 LAB — URINALYSIS, ROUTINE W REFLEX MICROSCOPIC
Bilirubin Urine: NEGATIVE
HGB URINE DIPSTICK: NEGATIVE
KETONES UR: NEGATIVE
Leukocytes, UA: NEGATIVE
NITRITE: NEGATIVE
RBC / HPF: NONE SEEN (ref 0–?)
TOTAL PROTEIN, URINE-UPE24: NEGATIVE
URINE GLUCOSE: NEGATIVE
UROBILINOGEN UA: 0.2 (ref 0.0–1.0)
pH: 6 (ref 5.0–8.0)

## 2016-10-22 LAB — LIPID PANEL
Cholesterol: 251 mg/dL — ABNORMAL HIGH (ref 0–200)
HDL: 46.7 mg/dL (ref >39.00)
LDL CALC: 181 mg/dL — AB (ref 0–99)
NONHDL: 204.37
Total CHOL/HDL Ratio: 5
Triglycerides: 116 mg/dL (ref 0.0–149.0)
VLDL: 23.2 mg/dL (ref 0.0–40.0)

## 2016-10-22 LAB — PSA: PSA: 1.62 ng/mL (ref 0.10–4.00)

## 2016-10-22 NOTE — Progress Notes (Signed)
Subjective:  Patient ID: [name], male    DOB: [birth year]  Age: 66 y.o. MRN: [id number]  CC: Annual Exam and Hip Pain   HPI [name] presents for an AWV/CPX.  He complains of a 3 month history of left hip pain with no recent trauma. The pain occurs with activity and does interfere with his sleep. He takes occasional dose of Aleve for symptom relief.  Past Medical History:  Diagnosis Date  . Allergic rhinitis   . Arthritis   . CTS (carpal tunnel syndrome)   . OSA (obstructive sleep apnea)    Past Surgical History:  Procedure Laterality Date  . BACK SURGERY     C4-5-6   2 cervical    from [facility]  . CARPAL TUNNEL RELEASE  09/16/2011   Procedure: CARPAL TUNNEL RELEASE;  Surgeon: Floyce Stakes, MD;  Location: [facility];  Service: Neurosurgery;  Laterality: Left;  Left Carpal Tunnel Release  . CERVICAL LAMINECTOMY    . KNEE ARTHROSCOPY WITH MEDIAL MENISECTOMY  07/06/2012   Procedure: KNEE ARTHROSCOPY WITH MEDIAL MENISECTOMY;  Surgeon: Magnus Sinning, MD;  Location: [facility];  Service: Orthopedics;  Laterality: Left;  LEFT KNEE ARTHROSCOPY WITH PARTIAL MEDIAL MENISECTOMY  . KNEE SURGERY    . NECK SURGERY    . ROTATOR CUFF REPAIR     bilateral     reports that he has never smoked. He has never used smokeless tobacco. He reports that he drinks about 3.0 oz of alcohol per week . He reports that he does not use drugs. family history includes Diabetes in his brother and brother. No Known Allergies  Outpatient Medications Prior to Visit  Medication Sig Dispense Refill  . Ascorbic Acid (VITAMIN C) 1000 MG tablet Take 1,000 mg by mouth daily.     . cholecalciferol (VITAMIN D) 1000 UNITS tablet Take 1,000 Units by mouth daily.     . Cyanocobalamin (VITAMIN B 12 PO) Take 1 tablet by mouth daily.    Marland Kitchen glucosamine-chondroitin 500-400 MG tablet Take 1 tablet by mouth daily.     . Multiple Vitamins-Minerals (CENTRUM SILVER PO) Take 1 tablet by mouth daily.     . NON FORMULARY  Take 1 capsule by mouth daily. muscadine seed 650 mg    . vitamin E 800 UNIT capsule Take 800 Units by mouth daily.     Marland Kitchen aspirin 81 MG tablet Take 81 mg by mouth daily.      No facility-administered medications prior to visit.     ROS Review of Systems  Constitutional: Negative.  Negative for activity change, diaphoresis, fatigue and unexpected weight change.  HENT: Negative.  Negative for trouble swallowing.   Respiratory: Negative for apnea, cough, chest tightness, shortness of breath, wheezing and stridor.   Cardiovascular: Negative for chest pain, palpitations and leg swelling.  Gastrointestinal: Negative for abdominal pain, constipation, diarrhea, nausea and vomiting.  Endocrine: Negative.  Negative for cold intolerance and heat intolerance.  Genitourinary: Negative.  Negative for difficulty urinating, flank pain, frequency, hematuria, penile swelling, scrotal swelling, testicular pain and urgency.  Musculoskeletal: Positive for arthralgias. Negative for back pain, myalgias and neck pain.  Skin: Negative.  Negative for color change and rash.  Allergic/Immunologic: Negative.   Neurological: Negative.  Negative for dizziness, weakness and numbness.  Hematological: Negative.  Negative for adenopathy. Does not bruise/bleed easily.  Psychiatric/Behavioral: Negative.     Objective:  BP 132/80 (BP Location: Left Arm, Patient Position: Sitting, Cuff Size: Large)  Pulse 99   Temp 98.5 F (36.9 C) (Oral)   Ht 6' (1.829 m)   Wt 212 lb 12 oz (96.5 kg)   SpO2 99%   BMI 28.85 kg/m   BP Readings from Last 3 Encounters:  10/22/16 132/80  10/07/15 122/68  10/04/14 126/80    Wt Readings from Last 3 Encounters:  10/22/16 212 lb 12 oz (96.5 kg)  10/07/15 207 lb (93.9 kg)  10/04/14 213 lb (96.6 kg)    Physical Exam  Constitutional: He is oriented to person, place, and time. No distress.  HENT:  Mouth/Throat: Oropharynx is clear and moist. No oropharyngeal exudate.  Eyes:  Conjunctivae are normal. Right eye exhibits no discharge. Left eye exhibits no discharge. No scleral icterus.  Neck: Normal range of motion. Neck supple. No JVD present. No tracheal deviation present. No thyromegaly present.  Cardiovascular: Normal rate, regular rhythm, normal heart sounds and intact distal pulses.  Exam reveals no gallop and no friction rub.   No murmur heard. Pulmonary/Chest: Effort normal and breath sounds normal. No stridor. No respiratory distress. He has no wheezes. He has no rales. He exhibits no tenderness.  Abdominal: Soft. Bowel sounds are normal. He exhibits no distension and no mass. There is no tenderness. There is no rebound and no guarding. Hernia confirmed negative in the right inguinal area and confirmed negative in the left inguinal area.  Genitourinary: Rectum normal, testes normal and penis normal. Rectal exam shows no external hemorrhoid, no internal hemorrhoid, no fissure, no mass, no tenderness, anal tone normal and guaiac negative stool. Prostate is enlarged (1+ smooth symm BPH). Prostate is not tender. Right testis shows no mass, no swelling and no tenderness. Right testis is descended. Left testis shows no mass, no swelling and no tenderness. Left testis is descended. Circumcised. No penile erythema or penile tenderness. No discharge found.  Musculoskeletal: He exhibits no edema, tenderness or deformity.       Left hip: He exhibits decreased range of motion. He exhibits normal strength, no tenderness, no bony tenderness, no swelling, no crepitus, no deformity and no laceration.  Lymphadenopathy:    He has no cervical adenopathy.       Right: No inguinal adenopathy present.       Left: No inguinal adenopathy present.  Neurological: He is oriented to person, place, and time.  Skin: Skin is warm and dry. No rash noted. He is not diaphoretic. No erythema. No pallor.  Psychiatric: He has a normal mood and affect. His behavior is normal. Judgment and thought  content normal.  Vitals reviewed.   Lab Results  Component Value Date   WBC 8.2 10/22/2016   HGB 14.7 10/22/2016   HCT 43.8 10/22/2016   PLT 189.0 10/22/2016   GLUCOSE 87 10/22/2016   CHOL 251 (H) 10/22/2016   TRIG 116.0 10/22/2016   HDL 46.70 10/22/2016   LDLDIRECT 206.6 08/04/2013   LDLCALC 181 (H) 10/22/2016   ALT 28 10/22/2016   AST 20 10/22/2016   NA 137 10/22/2016   K 4.6 10/22/2016   CL 103 10/22/2016   CREATININE 0.97 10/22/2016   BUN 18 10/22/2016   CO2 30 10/22/2016   TSH 2.62 10/22/2016   PSA 1.62 10/22/2016   INR 1.0 07/02/2008    No results found.  Assessment & Plan:   Diontre was seen today for annual exam and hip pain.  Diagnoses and all orders for this visit:  Benign prostatic hyperplasia without lower urinary tract symptoms- his PSA is normal so  I am not concerned about prostate cancer, also he has no symptoms that need to be treated. -     PSA; Future -     Urinalysis, Routine w reflex microscopic; Future  Hyperlipidemia with target LDL less than 160- his Framingham risk was up to 20% so I have asked him to start taking a statin and aspirin for cardiovascular risk reduction. -     Lipid panel; Future -     Comprehensive metabolic panel; Future -     CBC with Differential/Platelet; Future -     TSH; Future -     aspirin 81 MG tablet; Take 1 tablet (81 mg total) by mouth daily. -     rosuvastatin (CRESTOR) 20 MG tablet; Take 1 tablet (20 mg total) by mouth daily.  Chronic left hip pain- his plain film is positive for degenerative joint disease so I will refer to orthopedics to see if he is candidate for hip surgery -     Cancel: DG HIP UNILAT WITH PELVIS MIN 4 VIEWS LEFT; Future -     Ambulatory referral to Orthopedic Surgery  Need for Streptococcus pneumoniae vaccination -     Pneumococcal polysaccharide vaccine 23-valent greater than or equal to 2yo subcutaneous/IM   I have changed Mr. Eckford's aspirin. I am also having him start on  rosuvastatin. Additionally, I am having him maintain his vitamin E, Multiple Vitamins-Minerals (CENTRUM SILVER PO), glucosamine-chondroitin, cholecalciferol, vitamin C, Cyanocobalamin (VITAMIN B 12 PO), and NON FORMULARY.  Meds ordered this encounter  Medications  . aspirin 81 MG tablet    Sig: Take 1 tablet (81 mg total) by mouth daily.    Dispense:  90 tablet    Refill:  3  . rosuvastatin (CRESTOR) 20 MG tablet    Sig: Take 1 tablet (20 mg total) by mouth daily.    Dispense:  90 tablet    Refill:  3   See AVS for instructions about healthy living and anticipatory guidance.  Follow-up: Return in about 3 months (around 01/22/2017).  Scarlette Calico, MD

## 2016-10-22 NOTE — Progress Notes (Signed)
Pre visit review using our clinic review tool, if applicable. No additional management support is needed unless otherwise documented below in the visit note. 

## 2016-10-22 NOTE — Patient Instructions (Signed)
Hip Bursitis Hip bursitis is inflammation of a fluid-filled sac (bursa) in the hip joint. The bursa protects the bones in the hip joint from rubbing against each other. Hip bursitis can cause mild to moderate pain, and symptoms often come and go over time. What are the causes? This condition may be caused by:  Injury to the hip.  Overuse of the muscles that surround the hip joint.  Arthritis or gout.  Diabetes.  Thyroid disease.  Cold weather.  Infection. In some cases, the cause may not be known. What are the signs or symptoms? Symptoms of this condition may include:  Mild or moderate pain in the hip area. Pain may get worse with movement.  Tenderness and swelling of the hip, especially on the outer side of the hip. Symptoms may come and go. If the bursa becomes infected, you may have the following symptoms:  Fever.  Red skin and a feeling of warmth in the hip area. How is this diagnosed? This condition may be diagnosed based on:  A physical exam.  Your medical history.  X-rays.  Removal of fluid from your inflamed bursa for testing (biopsy). You may be sent to a health care provider who specializes in bone diseases (orthopedist) or a provider who specializes in joint inflammation (rheumatologist). How is this treated? This condition is treated by resting, raising (elevating), and applying pressure(compression) to the injured area. In some cases, this may be enough to make your symptoms go away. Treatment may also include:  Crutches.  Antibiotic medicine.  Draining fluid out of the bursa to help relieve swelling.  Injecting medicine that helps to reduce inflammation (cortisone). Follow these instructions at home: Medicines   Take over-the-counter and prescription medicines only as told by your health care provider.  Do not drive or operate heavy machinery while taking prescription pain medicine, or as told by your health care provider.  If you were  prescribed an antibiotic, take it as told by your health care provider. Do not stop taking the antibiotic even if you start to feel better. Activity   Return to your normal activities as told by your health care provider. Ask your health care provider what activities are safe for you.  Rest and protect your hip as much as possible until your pain and swelling get better. General instructions   Wear compression wraps only as told by your health care provider.  Elevate your hip above the level of your heart as much as you can without pain. To do this, try putting a pillow under your hips while you lie down.  Do not use your hip to support your body weight until your health care provider says that you can. Use crutches as told by your health care provider.  Gently massage and stretch your injured area as often as is comfortable.  Keep all follow-up visits as told by your health care provider. This is important. How is this prevented?  Exercise regularly, as told by your health care provider.  Warm up and stretch before being active.  Cool down and stretch after being active.  If an activity irritates your hip or causes pain, avoid the activity as much as possible.  Avoid sitting down for long periods at a time. Contact a health care provider if:  You have a fever.  You develop new symptoms.  You have difficulty walking or doing everyday activities.  You have pain that gets worse or does not get better with medicine.  You develop red  skin or a feeling of warmth in your hip area. Get help right away if:  You cannot move your hip.  You have severe pain. This information is not intended to replace advice given to you by your health care provider. Make sure you discuss any questions you have with your health care provider. Document Released: 01/23/2002 Document Revised: 01/09/2016 Document Reviewed: 03/05/2015 Elsevier Interactive Patient Education  2017 [employer].

## 2016-10-23 ENCOUNTER — Encounter: Payer: Self-pay | Admitting: Internal Medicine

## 2016-10-24 ENCOUNTER — Encounter: Payer: Self-pay | Admitting: Internal Medicine

## 2016-10-24 MED ORDER — ASPIRIN 81 MG PO TABS: 81.0000 mg | ORAL_TABLET | Freq: Every day | ORAL | 3 refills | Status: DC

## 2016-10-24 MED ORDER — ROSUVASTATIN CALCIUM 20 MG PO TABS: 20.0000 mg | ORAL_TABLET | Freq: Every day | ORAL | 3 refills | Status: DC

## 2016-10-24 NOTE — Assessment & Plan Note (Signed)

## 2016-11-10 DIAGNOSIS — M25552 Pain in left hip: Secondary | ICD-10-CM | POA: Diagnosis not present

## 2016-11-21 DIAGNOSIS — M25552 Pain in left hip: Secondary | ICD-10-CM | POA: Diagnosis not present

## 2016-11-26 DIAGNOSIS — M1612 Unilateral primary osteoarthritis, left hip: Secondary | ICD-10-CM | POA: Diagnosis not present

## 2016-11-27 DIAGNOSIS — L814 Other melanin hyperpigmentation: Secondary | ICD-10-CM | POA: Diagnosis not present

## 2016-11-27 DIAGNOSIS — L821 Other seborrheic keratosis: Secondary | ICD-10-CM | POA: Diagnosis not present

## 2016-11-27 DIAGNOSIS — L570 Actinic keratosis: Secondary | ICD-10-CM | POA: Diagnosis not present

## 2016-12-03 DIAGNOSIS — M25552 Pain in left hip: Secondary | ICD-10-CM | POA: Diagnosis not present

## 2016-12-16 ENCOUNTER — Telehealth: Payer: Self-pay | Admitting: Internal Medicine

## 2016-12-16 ENCOUNTER — Encounter: Payer: Self-pay | Admitting: Internal Medicine

## 2016-12-16 DIAGNOSIS — Z9989 Dependence on other enabling machines and devices: Secondary | ICD-10-CM

## 2016-12-16 DIAGNOSIS — G4733 Obstructive sleep apnea (adult) (pediatric): Secondary | ICD-10-CM

## 2016-12-16 NOTE — Telephone Encounter (Signed)
Needing replacement parts on his CPAP machine. The company that he spoke ([place] with said that he would need to have orders from Dr [name] to be able to get them. The pt was here for a physical on 10/22/16.

## 2016-12-17 ENCOUNTER — Other Ambulatory Visit: Payer: Self-pay | Admitting: Internal Medicine

## 2016-12-17 DIAGNOSIS — Z9989 Dependence on other enabling machines and devices: Secondary | ICD-10-CM | POA: Insufficient documentation

## 2016-12-17 DIAGNOSIS — G4733 Obstructive sleep apnea (adult) (pediatric): Secondary | ICD-10-CM

## 2016-12-17 DIAGNOSIS — M1612 Unilateral primary osteoarthritis, left hip: Secondary | ICD-10-CM | POA: Diagnosis not present

## 2016-12-21 ENCOUNTER — Ambulatory Visit (INDEPENDENT_AMBULATORY_CARE_PROVIDER_SITE_OTHER): Payer: Medicare Other | Admitting: Neurology

## 2016-12-21 ENCOUNTER — Encounter: Payer: Self-pay | Admitting: Neurology

## 2016-12-21 VITALS — BP 128/60 | HR 70 | Resp 16 | Ht 72.0 in | Wt 210.0 lb

## 2016-12-21 DIAGNOSIS — G4733 Obstructive sleep apnea (adult) (pediatric): Secondary | ICD-10-CM | POA: Diagnosis not present

## 2016-12-21 DIAGNOSIS — Z9989 Dependence on other enabling machines and devices: Secondary | ICD-10-CM

## 2016-12-21 NOTE — Progress Notes (Signed)
Subjective:    Patient ID: Ronald Franklin is a 66 y.o. male.  HPI     Star Age, MD, PhD [facility] [address], [address] [place], [place] [address]  Dear Dr. [name],   I saw your patient, Ronald Franklin, upon your kind request in my neurologic clinic today for initial consultation of his sleep disorder, in particular, concern for underlying obstructive sleep apnea. The patient is unaccompanied today. As you know, [name] is a 66 year old right-handed gentleman with an underlying medical history of allergic rhinitis, arthritis, carpal tunnel syndrome, left hip pain, degenerative spine disease with status post neck surgery, status post carpal tunnel release on the left, arthroscopic knee surgery, rotator cuff repair bilaterally, BPH, and overweight state, who was previously diagnosed with obstructive sleep apnea. He was placed on CPAP therapy. Prior sleep study results were reviewed from 05/21/2005, study was interpreted by Dr. Annamaria Boots. He had a split-night sleep study, baseline AHI was 70.6 per hour, O2 nadir 88%, he was titrated on CPAP to up to 14 cm with final AHI 0 per hour. He has been using a swift fracture nasal pillows, size medium. He has older CPAP machine, this is his second CPAP machine, Remstar Pro and he reports being fully compliant with it. At the time of his original sleep study nearly 12 years ago his weight was 192, current weight 210, he typically hovers around 208 pounds he is working on losing weight until he gets to about 200. I reviewed your office note from 10/22/2016. I reviewed his CPAP compliance data from 11/21/2016 through 12/20/2017, which is a total of 30 days, during which time he used his CPAP every night with percent used days greater than 4 hours at 100%, indicating superb compliance with an average usage of 6 hours and 55 minutes, residual AHI suboptimal at 11.6 per hour, CPAP pressure at 14 cm with acceptable or low leak. He  has been compliant with treatment but needs new supplies. His previous DME company no longer accepts his insurance as I understand. He denies restless leg symptoms. His bedtime is around 11 PM, wakeup time by habit around 6 to 6:30 AM. He is retired from [employer] at [employer] for nearly 38 years.  His Epworth sleepiness score is 11 out of 24, fatigue score is 10 out of 63. He is married and lives with his wife, he is retired. He has 4 biological children, 2 stepchildren. He is a nonsmoker, drinks alcohol about 3 times a week and caffeine in the form of coffee, about once per day.   His Past Medical History Is Significant For: Past Medical History:  Diagnosis Date  . Allergic rhinitis   . Arthritis   . CTS (carpal tunnel syndrome)   . OSA (obstructive sleep apnea)     His Past Surgical History Is Significant For: Past Surgical History:  Procedure Laterality Date  . BACK SURGERY     C4-5-6   2 cervical    from [facility]  . CARPAL TUNNEL RELEASE  09/16/2011   Procedure: CARPAL TUNNEL RELEASE;  Surgeon: Floyce Stakes, MD;  Location: [facility];  Service: Neurosurgery;  Laterality: Left;  Left Carpal Tunnel Release  . CERVICAL LAMINECTOMY    . KNEE ARTHROSCOPY WITH MEDIAL MENISECTOMY  07/06/2012   Procedure: KNEE ARTHROSCOPY WITH MEDIAL MENISECTOMY;  Surgeon: Magnus Sinning, MD;  Location: [facility];  Service: Orthopedics;  Laterality: Left;  LEFT KNEE ARTHROSCOPY WITH PARTIAL MEDIAL MENISECTOMY  .  KNEE SURGERY    . NECK SURGERY    . ROTATOR CUFF REPAIR     bilateral     His Family History Is Significant For: Family History  Problem Relation Age of Onset  . Diabetes Brother   . Diabetes Brother   . Heart attack Mother   . Heart attack Father   . Hyperlipidemia    . Prostate cancer    . Heart disease Neg Hx   . Early death Neg Hx   . Alcohol abuse Neg Hx   . Arthritis Neg Hx   . Cancer Neg Hx   . Stroke Neg Hx   . Kidney disease Neg Hx   . Hypertension Neg Hx      His Social History Is Significant For: Social History   Social History  . Marital status: Married    Spouse name: N/A  . Number of children: 6  . Years of education: BS   Occupational History  . Retired    Social History Main Topics  . Smoking status: Never Smoker  . Smokeless tobacco: Never Used  . Alcohol use 3.0 oz/week    5 Glasses of wine per week     Comment: socially  . Drug use: No  . Sexual activity: Yes    Birth control/ protection: None   Other Topics Concern  . 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 Married. Has children.   Drinks 1 cup of coffee a day     His Allergies Are:  No Known Allergies:   His Current Medications Are:  Outpatient Encounter Prescriptions as of 12/21/2016  Medication Sig  . Ascorbic Acid (VITAMIN C) 1000 MG tablet Take 1,000 mg by mouth daily.   Marland Kitchen aspirin 81 MG tablet Take 1 tablet (81 mg total) by mouth daily.  . cholecalciferol (VITAMIN D) 1000 UNITS tablet Take 1,000 Units by mouth daily.   . Cyanocobalamin (VITAMIN B 12 PO) Take 1 tablet by mouth daily.  Marland Kitchen glucosamine-chondroitin 500-400 MG tablet Take 1 tablet by mouth daily.   . Multiple Vitamins-Minerals (CENTRUM SILVER PO) Take 1 tablet by mouth daily.   . NON FORMULARY Take 1 capsule by mouth daily. muscadine seed 650 mg  . vitamin E 800 UNIT capsule Take 800 Units by mouth daily.   . [DISCONTINUED] rosuvastatin (CRESTOR) 20 MG tablet Take 1 tablet (20 mg total) by mouth daily.   No facility-administered encounter medications on file as of 12/21/2016.   :  Review of Systems:  Out of a complete 14 point review of systems, all are reviewed and negative with the exception of these symptoms as listed below: Review of Systems  Neurological:       Patient states that his last sleep study was 13-14 years ago. He is currently using CPAP. Last DME [employer].   No trouble falling and  staying asleep, snores, witnessed apnea.    Epworth Sleepiness Scale 0= would never doze 1= slight chance of dozing 2= moderate chance of dozing 3= high chance of dozing  Sitting and reading:1 Watching TV:1 Sitting inactive in a public place (ex. Theater or meeting):1 As a passenger in a car for an hour without a break:1 Lying down to rest in the afternoon:3 Sitting and talking to someone:2 Sitting quietly after lunch (no alcohol):2 In a car, while stopped in traffic:0 Total:11  Objective:  Neurologic Exam  Physical  Exam Physical Examination:   Vitals:   12/21/16 1009  BP: 128/60  Pulse: 70  Resp: 16   General Examination: The patient is a very pleasant 66 y.o. male in no acute distress. He appears well-developed and well-nourished and well groomed.   HEENT: Normocephalic, atraumatic, pupils are equal, round and reactive to light and accommodation. Funduscopic exam is normal with sharp disc margins noted. Extraocular tracking is good without limitation to gaze excursion or nystagmus noted. Normal smooth pursuit is noted. Hearing is grossly intact. Tympanic membranes are clear bilaterally. Face is symmetric with normal facial animation and normal facial sensation. Speech is clear with no dysarthria noted. There is no hypophonia. There is no lip, neck/head, jaw or voice tremor. Neck is supple with full range of passive and active motion. There are no carotid bruits on auscultation. Oropharynx exam reveals: mild mouth dryness, adequate dental hygiene and moderate airway crowding, due to larger tongue and wider uvula. Mallampati is class II. Tongue protrudes centrally and palate elevates symmetrically. Tonsils are small or absent? Neck size is 17 3/8 inches. He has a Mild overbite.   Chest: Clear to auscultation without wheezing, rhonchi or crackles noted.  Heart: S1+S2+0, regular and normal without murmurs, rubs or gallops noted.   Abdomen: Soft, non-tender and non-distended with  normal bowel sounds appreciated on auscultation.  Extremities: There is no pitting edema in the distal lower extremities bilaterally. Pedal pulses are intact.  Skin: Warm and dry without trophic changes noted. There are no varicose veins.  Musculoskeletal: exam reveals no obvious joint deformities, tenderness or joint swelling or erythema, has L hip pain.   Neurologically:  Mental status: The patient is awake, alert and oriented in all 4 spheres. His immediate and remote memory, attention, language skills and fund of knowledge are appropriate. There is no evidence of aphasia, agnosia, apraxia or anomia. Speech is clear with normal prosody and enunciation. Thought process is linear. Mood is normal and affect is normal.  Cranial nerves II - XII are as described above under HEENT exam. In addition: shoulder shrug is normal with equal shoulder height noted. Motor exam: Normal bulk, strength and tone is noted. There is no drift, tremor or rebound. Romberg is negative. Reflexes are 2+ throughout. Fine motor skills and coordination: intact with normal finger taps, normal hand movements, normal rapid alternating patting, normal foot taps and normal foot agility.  Cerebellar testing: No dysmetria or intention tremor on finger to nose testing. Heel to shin is unremarkable bilaterally. There is no truncal or gait ataxia.  Sensory exam: intact to light touch proprioception in the upper and lower extremities.  Gait, station and balance: He stands easily. No veering to one side is noted. No leaning to one side is noted. Posture is age-appropriate and stance is narrow based. Gait shows normal stride length and normal pace. No problems turning are noted. Tandem walk is unremarkable.   Assessment and Plan:  In summary, KIYAN BURMESTER is a very pleasant 66 y.o.-year old male with an underlying medical history of allergic rhinitis, arthritis, carpal tunnel syndrome, left hip pain, degenerative spine disease with  status post neck surgery, status post carpal tunnel release on the left, arthroscopic knee surgery, rotator cuff repair bilaterally, BPH, and overweight state, whose history and physical exam are in Keeping with obstructive sleep apnea (OSA).he has a prior diagnosis of severe obstructive sleep apnea with an AHI of 70.6 per hour. He has been fully compliant with CPAP therapy, currently using a medium nasal  pillows interface, CPAP pressure has been at 14 cm from the get go. Current AHI suboptimal at 11.6 per hour. He has gained weight in the interim.  I had a long chat with the patient about my findings and the diagnosis of OSA, its prognosis and treatment options. We talked about medical treatments, surgical interventions and non-pharmacological approaches. I explained in particular the risks and ramifications of untreated moderate to severe OSA, especially with respect to developing cardiovascular disease down the Road, including congestive heart failure, difficult to treat hypertension, cardiac arrhythmias, or stroke. Even type 2 diabetes has, in part, been linked to untreated OSA. Symptoms of untreated OSA include daytime sleepiness, memory problems, mood irritability and mood disorder such as depression and anxiety, lack of energy, as well as recurrent headaches, especially morning headaches. We talked about trying to maintain a healthy lifestyle in general, as well as the importance of weight control. I encouraged the patient to eat healthy, exercise daily and keep well hydrated, to keep a scheduled bedtime and wake time routine, to not skip any meals and eat healthy snacks in between meals. I advised the patient not to drive when feeling sleepy. I recommended the following at this time: would like to see if we can get him a new CPAP machine and updated supplies based on his original split-night sleep study results from October 2006. He is commended for his CPAP adherence. Physical exam and neurological exam  are nonfocal. If insurance mandates, we may have to bring him back for a repeat split-night sleep study. We will be in touch with him shortly. We will try to set him up through a DME company. If all goes well I can see him back on a yearly basis. I answered all his questions today and he was in agreement.   Thank you very much for allowing me to participate in the care of this nice patient. If I can be of any further assistance to you please do not hesitate to call me at [phone].  Sincerely,   Star Age, MD, PhD

## 2016-12-21 NOTE — Patient Instructions (Addendum)
Based on your symptoms and your exam I believe you still have obstructive sleep apnea or OSA.  You have been compliant with CPAP. I will prescribe supplies and hopefully a new machine.  Per insurance requirement, we may need to do another sleep study.   We will let you know.   Please remember, the risks and ramifications of moderate to severe obstructive sleep apnea or OSA are: Cardiovascular disease, including congestive heart failure, stroke, difficult to control hypertension, arrhythmias, and even type 2 diabetes has been linked to untreated OSA. Sleep apnea causes disruption of sleep and sleep deprivation in most cases, which, in turn, can cause recurrent headaches, problems with memory, mood, concentration, focus, and vigilance. Most people with untreated sleep apnea report excessive daytime sleepiness, which can affect their ability to drive. Please do not drive if you feel sleepy.   I will likely see you back after your sleep study to go over the test results and where to go from there. We will call you after your sleep study to advise about the results (most likely, you will hear from Beverlee Nims, my nurse) and to set up an appointment at the time, as necessary.    Our sleep lab administrative assistant, Arrie Aran will meet with you or call you to schedule your sleep study. If you don't hear back from her by next week please feel free to call her at [phone]. This is her direct line and please leave a message with your phone number to call back if you get the voicemail box. She will call back as soon as possible.

## 2016-12-21 NOTE — Telephone Encounter (Signed)
Called pt to verify what he needed. Pt stated that he needs the entire machince. Sleep study completed today.   Message sent to [facility] to verify that they take pt insurance.

## 2016-12-23 NOTE — Telephone Encounter (Signed)
CPAP ordered

## 2017-02-15 DIAGNOSIS — D1801 Hemangioma of skin and subcutaneous tissue: Secondary | ICD-10-CM | POA: Diagnosis not present

## 2017-02-15 DIAGNOSIS — L570 Actinic keratosis: Secondary | ICD-10-CM | POA: Diagnosis not present

## 2017-02-15 DIAGNOSIS — L814 Other melanin hyperpigmentation: Secondary | ICD-10-CM | POA: Diagnosis not present

## 2017-02-15 DIAGNOSIS — L821 Other seborrheic keratosis: Secondary | ICD-10-CM | POA: Diagnosis not present

## 2017-02-16 DIAGNOSIS — M1612 Unilateral primary osteoarthritis, left hip: Secondary | ICD-10-CM | POA: Diagnosis not present

## 2017-02-25 DIAGNOSIS — M1612 Unilateral primary osteoarthritis, left hip: Secondary | ICD-10-CM | POA: Diagnosis not present

## 2017-03-09 DIAGNOSIS — M1612 Unilateral primary osteoarthritis, left hip: Secondary | ICD-10-CM | POA: Diagnosis not present

## 2017-03-23 DIAGNOSIS — L821 Other seborrheic keratosis: Secondary | ICD-10-CM | POA: Diagnosis not present

## 2017-03-23 DIAGNOSIS — C4441 Basal cell carcinoma of skin of scalp and neck: Secondary | ICD-10-CM | POA: Diagnosis not present

## 2017-03-23 DIAGNOSIS — L570 Actinic keratosis: Secondary | ICD-10-CM | POA: Diagnosis not present

## 2017-03-23 DIAGNOSIS — D1801 Hemangioma of skin and subcutaneous tissue: Secondary | ICD-10-CM | POA: Diagnosis not present

## 2017-03-23 DIAGNOSIS — D485 Neoplasm of uncertain behavior of skin: Secondary | ICD-10-CM | POA: Diagnosis not present

## 2017-03-23 DIAGNOSIS — B079 Viral wart, unspecified: Secondary | ICD-10-CM | POA: Diagnosis not present

## 2017-08-18 DIAGNOSIS — M1612 Unilateral primary osteoarthritis, left hip: Secondary | ICD-10-CM | POA: Diagnosis not present

## 2017-08-18 DIAGNOSIS — M7062 Trochanteric bursitis, left hip: Secondary | ICD-10-CM | POA: Diagnosis not present

## 2017-08-18 DIAGNOSIS — M25552 Pain in left hip: Secondary | ICD-10-CM | POA: Diagnosis not present

## 2017-08-23 DIAGNOSIS — M7062 Trochanteric bursitis, left hip: Secondary | ICD-10-CM | POA: Diagnosis not present

## 2017-08-23 DIAGNOSIS — L814 Other melanin hyperpigmentation: Secondary | ICD-10-CM | POA: Diagnosis not present

## 2017-08-23 DIAGNOSIS — L918 Other hypertrophic disorders of the skin: Secondary | ICD-10-CM | POA: Diagnosis not present

## 2017-08-23 DIAGNOSIS — L570 Actinic keratosis: Secondary | ICD-10-CM | POA: Diagnosis not present

## 2017-10-14 DIAGNOSIS — H2513 Age-related nuclear cataract, bilateral: Secondary | ICD-10-CM | POA: Diagnosis not present

## 2017-10-25 ENCOUNTER — Other Ambulatory Visit (INDEPENDENT_AMBULATORY_CARE_PROVIDER_SITE_OTHER): Payer: Medicare Other

## 2017-10-25 ENCOUNTER — Encounter: Payer: Self-pay | Admitting: Internal Medicine

## 2017-10-25 ENCOUNTER — Ambulatory Visit (INDEPENDENT_AMBULATORY_CARE_PROVIDER_SITE_OTHER): Payer: Medicare Other | Admitting: Internal Medicine

## 2017-10-25 VITALS — BP 122/80 | HR 78 | Temp 98.9°F | Resp 16 | Ht 72.0 in | Wt 214.6 lb

## 2017-10-25 DIAGNOSIS — E785 Hyperlipidemia, unspecified: Secondary | ICD-10-CM

## 2017-10-25 DIAGNOSIS — Z1159 Encounter for screening for other viral diseases: Secondary | ICD-10-CM

## 2017-10-25 DIAGNOSIS — N400 Enlarged prostate without lower urinary tract symptoms: Secondary | ICD-10-CM | POA: Diagnosis not present

## 2017-10-25 DIAGNOSIS — R1312 Dysphagia, oropharyngeal phase: Secondary | ICD-10-CM

## 2017-10-25 DIAGNOSIS — Z0000 Encounter for general adult medical examination without abnormal findings: Secondary | ICD-10-CM

## 2017-10-25 LAB — LIPID PANEL
Cholesterol: 242 mg/dL — ABNORMAL HIGH (ref 0–200)
HDL: 50 mg/dL
LDL Cholesterol: 178 mg/dL — ABNORMAL HIGH (ref 0–99)
NonHDL: 192.12
Total CHOL/HDL Ratio: 5
Triglycerides: 72 mg/dL (ref 0.0–149.0)
VLDL: 14.4 mg/dL (ref 0.0–40.0)

## 2017-10-25 LAB — COMPREHENSIVE METABOLIC PANEL WITH GFR
ALT: 31 U/L (ref 0–53)
AST: 18 U/L (ref 0–37)
Albumin: 4.5 g/dL (ref 3.5–5.2)
Alkaline Phosphatase: 65 U/L (ref 39–117)
BUN: 17 mg/dL (ref 6–23)
CO2: 31 meq/L (ref 19–32)
Calcium: 10.1 mg/dL (ref 8.4–10.5)
Chloride: 102 meq/L (ref 96–112)
Creatinine, Ser: 0.98 mg/dL (ref 0.40–1.50)
GFR: 80.87 mL/min
Glucose, Bld: 102 mg/dL — ABNORMAL HIGH (ref 70–99)
Potassium: 4.4 meq/L (ref 3.5–5.1)
Sodium: 138 meq/L (ref 135–145)
Total Bilirubin: 0.5 mg/dL (ref 0.2–1.2)
Total Protein: 7.1 g/dL (ref 6.0–8.3)

## 2017-10-25 NOTE — Progress Notes (Signed)
Subjective:  Patient ID: Ronald Franklin, male    DOB: 04-20-1951  Age: 67 y.o. MRN: [id number]  CC: Hyperlipidemia and Annual Exam   HPI Ronald Franklin presents for a CPX.  He continues to be very active and denies any recent episodes of DOE, CP, palpitations, edema, or fatigue.  He does complain of weight gain.  He complains that food is getting stuck in the lower part of his throat and he wants to see a gastroenterologist to see if he needs to have an upper endoscopy.  He rarely has heartburn and denies odynophagia or weight loss.  Past Medical History:  Diagnosis Date  . Allergic rhinitis   . Arthritis   . CTS (carpal tunnel syndrome)   . OSA (obstructive sleep apnea)    Past Surgical History:  Procedure Laterality Date  . BACK SURGERY     C4-5-6   2 cervical    from [facility]  . CARPAL TUNNEL RELEASE  09/16/2011   Procedure: CARPAL TUNNEL RELEASE;  Surgeon: Floyce Stakes, MD;  Location: [facility];  Service: Neurosurgery;  Laterality: Left;  Left Carpal Tunnel Release  . CERVICAL LAMINECTOMY    . KNEE ARTHROSCOPY WITH MEDIAL MENISECTOMY  07/06/2012   Procedure: KNEE ARTHROSCOPY WITH MEDIAL MENISECTOMY;  Surgeon: Magnus Sinning, MD;  Location: [facility];  Service: Orthopedics;  Laterality: Left;  LEFT KNEE ARTHROSCOPY WITH PARTIAL MEDIAL MENISECTOMY  . KNEE SURGERY    . NECK SURGERY    . ROTATOR CUFF REPAIR     bilateral     reports that  has never smoked. he has never used smokeless tobacco. He reports that he drinks about 3.0 oz of alcohol per week. He reports that he does not use drugs. family history includes Diabetes in his brother and brother; Heart attack in his father and mother; Hyperlipidemia in his unknown relative; Prostate cancer in his unknown relative. No Known Allergies  Outpatient Medications Prior to Visit  Medication Sig Dispense Refill  . Ascorbic Acid (VITAMIN C) 1000 MG tablet Take 1,000 mg by mouth daily.     Marland Kitchen aspirin 81 MG tablet Take 1 tablet (81  mg total) by mouth daily. 90 tablet 3  . cholecalciferol (VITAMIN D) 1000 UNITS tablet Take 1,000 Units by mouth daily.     . Cyanocobalamin (VITAMIN B 12 PO) Take 1 tablet by mouth daily.    Marland Kitchen glucosamine-chondroitin 500-400 MG tablet Take 1 tablet by mouth daily.     . Multiple Vitamins-Minerals (CENTRUM SILVER PO) Take 1 tablet by mouth daily.     . NON FORMULARY Take 1 capsule by mouth daily. muscadine seed 650 mg    . Turmeric 500 MG CAPS Take by mouth.    . vitamin E 800 UNIT capsule Take 800 Units by mouth daily.      No facility-administered medications prior to visit.     ROS Review of Systems  Constitutional: Positive for unexpected weight change. Negative for appetite change, chills, diaphoresis and fatigue.  HENT: Positive for trouble swallowing. Negative for sore throat and voice change.   Eyes: Negative for visual disturbance.  Respiratory: Positive for apnea. Negative for cough, chest tightness, shortness of breath and wheezing.   Cardiovascular: Negative for chest pain, palpitations and leg swelling.  Gastrointestinal: Negative for abdominal pain, constipation, diarrhea, nausea and vomiting.  Endocrine: Negative.   Genitourinary: Negative.  Negative for difficulty urinating, penile swelling, scrotal swelling, testicular pain and urgency.  Musculoskeletal: Negative.  Negative for  arthralgias and myalgias.  Skin: Negative.  Negative for color change and pallor.  Allergic/Immunologic: Negative.   Neurological: Negative.  Negative for dizziness and light-headedness.  Hematological: Negative for adenopathy. Does not bruise/bleed easily.  Psychiatric/Behavioral: Negative.     Objective:  BP 122/80 (BP Location: Left Arm, Patient Position: Sitting, Cuff Size: Normal)   Pulse 78   Temp 98.9 F (37.2 C) (Oral)   Resp 16   Ht 6' (1.829 m)   Wt 214 lb 9.6 oz (97.3 kg)   SpO2 99%   BMI 29.10 kg/m   BP Readings from Last 3 Encounters:  10/25/17 122/80  12/21/16 128/60    10/22/16 132/80    Wt Readings from Last 3 Encounters:  10/25/17 214 lb 9.6 oz (97.3 kg)  12/21/16 210 lb (95.3 kg)  10/22/16 212 lb 12 oz (96.5 kg)    Physical Exam  Constitutional: No distress.  HENT:  Mouth/Throat: Oropharynx is clear and moist. No oropharyngeal exudate.  Eyes: Conjunctivae are normal. Right eye exhibits no discharge. Left eye exhibits no discharge. No scleral icterus.  Neck: Normal range of motion. Neck supple. No JVD present. No thyromegaly present.  Cardiovascular: Normal rate, regular rhythm and normal heart sounds. Exam reveals no gallop and no friction rub.  No murmur heard. Pulmonary/Chest: Effort normal and breath sounds normal. No respiratory distress. He has no wheezes. He has no rales.  Abdominal: Soft. Bowel sounds are normal. He exhibits no distension and no mass. There is no tenderness. There is no rebound and no guarding. Hernia confirmed negative in the right inguinal area and confirmed negative in the left inguinal area.  Genitourinary: Rectum normal, testes normal and penis normal. Rectal exam shows no external hemorrhoid, no internal hemorrhoid, no fissure, no mass, no tenderness, anal tone normal and guaiac negative stool. Prostate is enlarged (1+ smooth symm BPH). Prostate is not tender. Right testis shows no mass, no swelling and no tenderness. Left testis shows no mass, no swelling and no tenderness. Circumcised. No penile erythema or penile tenderness. No discharge found.  Musculoskeletal: Normal range of motion. He exhibits no edema, tenderness or deformity.  Lymphadenopathy:    He has no cervical adenopathy.       Right: No inguinal adenopathy present.       Left: No inguinal adenopathy present.  Skin: He is not diaphoretic.  Vitals reviewed.   Lab Results  Component Value Date   WBC 8.2 10/22/2016   HGB 14.7 10/22/2016   HCT 43.8 10/22/2016   PLT 189.0 10/22/2016   GLUCOSE 102 (H) 10/25/2017   CHOL 242 (H) 10/25/2017   TRIG 72.0  10/25/2017   HDL 50.00 10/25/2017   LDLDIRECT 206.6 08/04/2013   LDLCALC 178 (H) 10/25/2017   ALT 31 10/25/2017   AST 18 10/25/2017   NA 138 10/25/2017   K 4.4 10/25/2017   CL 102 10/25/2017   CREATININE 0.98 10/25/2017   BUN 17 10/25/2017   CO2 31 10/25/2017   TSH 2.83 10/25/2017   PSA 1.82 10/25/2017   INR 1.0 07/02/2008    Dg Hip Unilat With Pelvis 2-3 Views Left  Result Date: 10/23/2016 CLINICAL DATA:  Left hip pain x3 months EXAM: DG HIP (WITH OR WITHOUT PELVIS) 2-3V LEFT COMPARISON:  None. FINDINGS: There is no evidence of hip fracture or dislocation. There is no evidence of arthropathy or other focal bone abnormality. Slight joint space narrowing along the weight-bearing portion of the left femoral head with minimal spurring at the femoral head- neck  juncture. No intra-articular loose body, fracture nor bone destruction. Tiny ossicle is seen off the acetabular roof. The adjacent pubic rami and left SI joint are unremarkable. IMPRESSION: Mild degenerative joint space narrowing of the left hip. No acute osseous abnormality. Electronically Signed   By: Ashley Royalty M.D.   On: 10/23/2016 03:41    Assessment & Plan:   Ronald Franklin was seen today for hyperlipidemia and annual exam.  Diagnoses and all orders for this visit:  Benign prostatic hyperplasia without lower urinary tract symptoms- His PSA remains low which is reassuring that he does not have prostate cancer.  He has no symptoms that need to be treated. -     PSA; Future  Hyperlipidemia with target LDL less than 160- He has an elevated ASCVD risk score so I have asked him to start taking a statin for CV risk reduction. -     Comprehensive metabolic panel; Future -     Lipid panel; Future -     TSH; Future -     rosuvastatin (CRESTOR) 20 MG tablet; Take 1 tablet (20 mg total) by mouth daily.  Need for hepatitis C screening test -     Hepatitis C antibody; Future  Oropharyngeal dysphagia- He will see GI to consider having an  upper endoscopy. -     Ambulatory referral to Gastroenterology   I am having Ronald Franklin start on rosuvastatin. I am also having him maintain his vitamin E, Multiple Vitamins-Minerals (CENTRUM SILVER PO), glucosamine-chondroitin, cholecalciferol, vitamin C, Cyanocobalamin (VITAMIN B 12 PO), NON FORMULARY, aspirin, and Turmeric.  Meds ordered this encounter  Medications  . rosuvastatin (CRESTOR) 20 MG tablet    Sig: Take 1 tablet (20 mg total) by mouth daily.    Dispense:  90 tablet    Refill:  1   See AVS for instructions about healthy living and anticipatory guidance.  Follow-up: No Follow-up on file.  Scarlette Calico, MD

## 2017-10-25 NOTE — Patient Instructions (Signed)

## 2017-10-26 ENCOUNTER — Encounter: Payer: Self-pay | Admitting: Internal Medicine

## 2017-10-26 LAB — PSA: PSA: 1.82 ng/mL (ref 0.10–4.00)

## 2017-10-26 LAB — HEPATITIS C ANTIBODY
Hepatitis C Ab: NONREACTIVE
SIGNAL TO CUT-OFF: 0.04 (ref <1.00)

## 2017-10-26 LAB — TSH: TSH: 2.83 u[IU]/mL (ref 0.35–4.50)

## 2017-10-27 MED ORDER — ROSUVASTATIN CALCIUM 20 MG PO TABS: 20.0000 mg | ORAL_TABLET | Freq: Every day | ORAL | 1 refills | Status: DC

## 2017-10-27 NOTE — Assessment & Plan Note (Signed)

## 2017-11-08 ENCOUNTER — Other Ambulatory Visit: Payer: Self-pay | Admitting: Gastroenterology

## 2017-11-08 ENCOUNTER — Ambulatory Visit
Admission: RE | Admit: 2017-11-08 | Discharge: 2017-11-08 | Disposition: A | Discharge status: Home / Self Care | Payer: Medicare Other | Source: Ambulatory Visit | Attending: Gastroenterology | Admitting: Gastroenterology

## 2017-11-08 DIAGNOSIS — R4702 Dysphasia: Secondary | ICD-10-CM

## 2017-11-08 DIAGNOSIS — K219 Gastro-esophageal reflux disease without esophagitis: Secondary | ICD-10-CM | POA: Diagnosis not present

## 2017-11-08 DIAGNOSIS — R1310 Dysphagia, unspecified: Secondary | ICD-10-CM | POA: Diagnosis not present

## 2017-11-23 DIAGNOSIS — Q396 Congenital diverticulum of esophagus: Secondary | ICD-10-CM | POA: Diagnosis not present

## 2017-11-23 DIAGNOSIS — R1310 Dysphagia, unspecified: Secondary | ICD-10-CM | POA: Diagnosis not present

## 2017-11-23 DIAGNOSIS — K314 Gastric diverticulum: Secondary | ICD-10-CM | POA: Diagnosis not present

## 2017-11-23 DIAGNOSIS — K229 Disease of esophagus, unspecified: Secondary | ICD-10-CM | POA: Diagnosis not present

## 2017-11-23 DIAGNOSIS — K219 Gastro-esophageal reflux disease without esophagitis: Secondary | ICD-10-CM | POA: Diagnosis not present

## 2017-11-29 DIAGNOSIS — K219 Gastro-esophageal reflux disease without esophagitis: Secondary | ICD-10-CM | POA: Diagnosis not present

## 2018-03-14 DIAGNOSIS — D22111 Melanocytic nevi of right upper eyelid, including canthus: Secondary | ICD-10-CM | POA: Diagnosis not present

## 2018-03-14 DIAGNOSIS — D1801 Hemangioma of skin and subcutaneous tissue: Secondary | ICD-10-CM | POA: Diagnosis not present

## 2018-03-14 DIAGNOSIS — L821 Other seborrheic keratosis: Secondary | ICD-10-CM | POA: Diagnosis not present

## 2018-03-14 DIAGNOSIS — D485 Neoplasm of uncertain behavior of skin: Secondary | ICD-10-CM | POA: Diagnosis not present

## 2018-03-14 DIAGNOSIS — L820 Inflamed seborrheic keratosis: Secondary | ICD-10-CM | POA: Diagnosis not present

## 2018-03-14 DIAGNOSIS — L570 Actinic keratosis: Secondary | ICD-10-CM | POA: Diagnosis not present

## 2018-05-10 IMAGING — DX DG HIP (WITH OR WITHOUT PELVIS) 2-3V*L*
2 series · 2 of 2 positions shown · non-contrast
Comparison: None.

CLINICAL DATA: Left hip pain x3 months

EXAM:
DG HIP (WITH OR WITHOUT PELVIS) 2-3V LEFT

[hip ap]
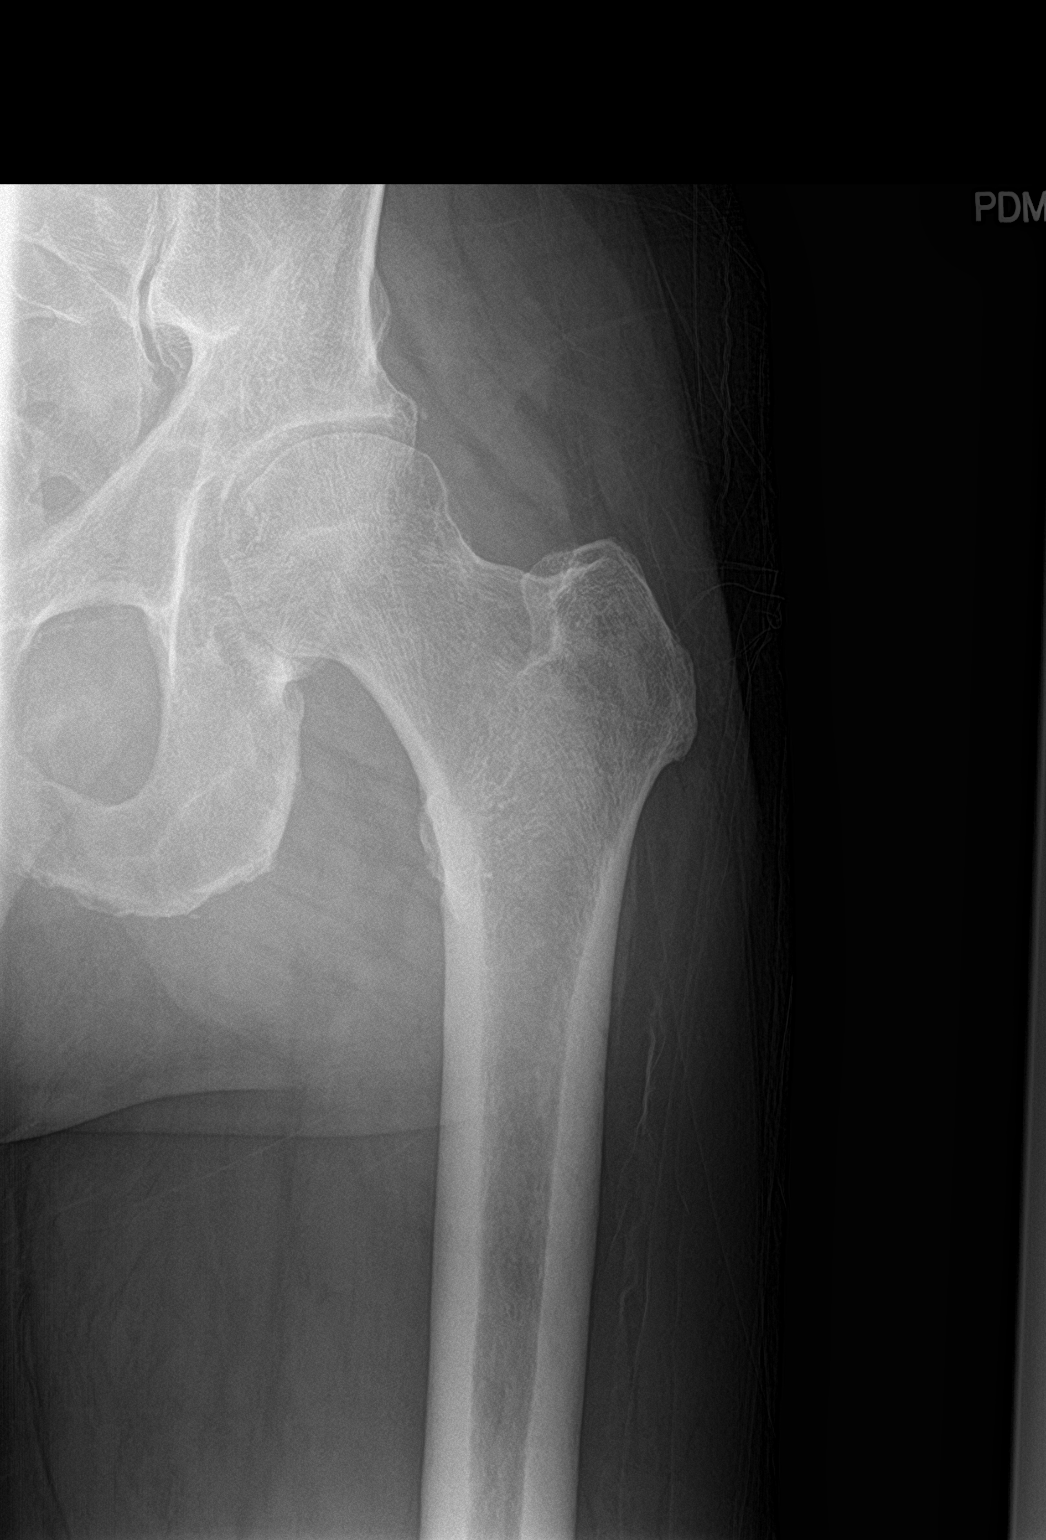

[hip lat]
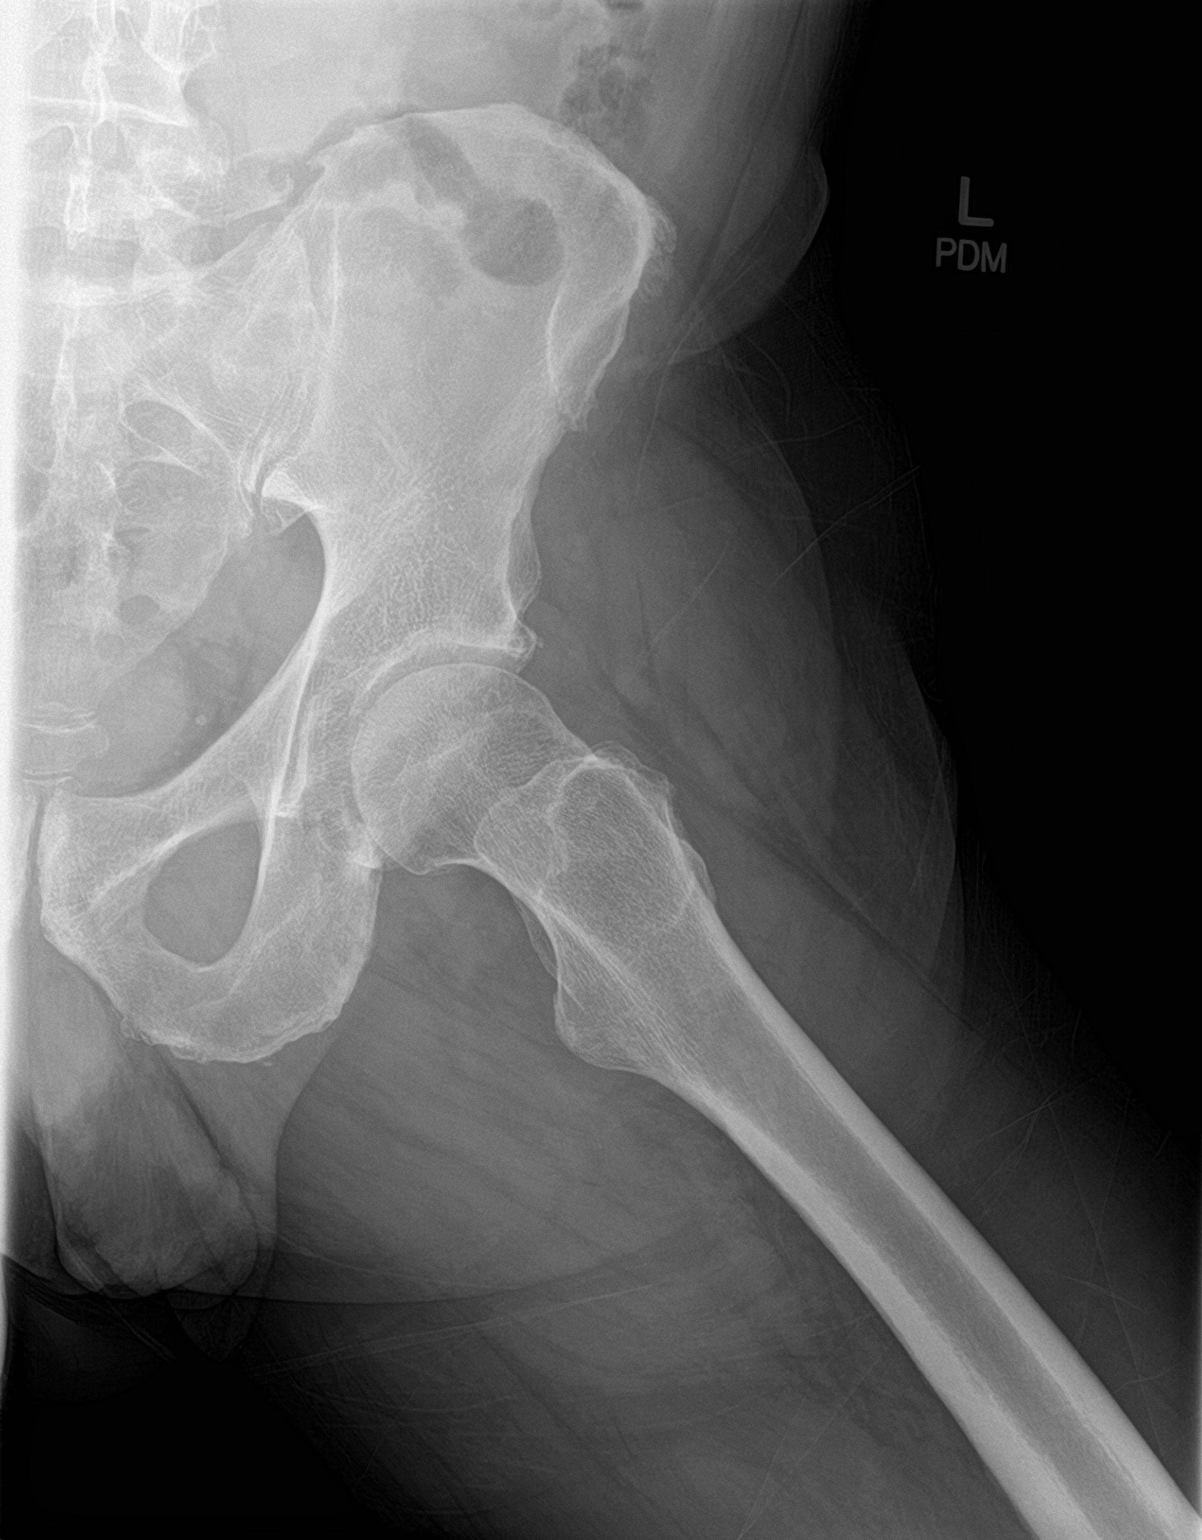

[2 of 2 positions shown; findings below may reference images not displayed]

FINDINGS: There is no evidence of hip fracture or dislocation. There is no
evidence of arthropathy or other focal bone abnormality. Slight
joint space narrowing along the weight-bearing portion of the left
femoral head with minimal spurring at the femoral head- neck
juncture. No intra-articular loose body, fracture nor bone
destruction. Tiny ossicle is seen off the acetabular roof. The
adjacent pubic rami and left SI joint are unremarkable.
IMPRESSION: Mild degenerative joint space narrowing of the left hip. No acute
osseous abnormality.

## 2018-09-07 DIAGNOSIS — L570 Actinic keratosis: Secondary | ICD-10-CM | POA: Diagnosis not present

## 2018-09-27 ENCOUNTER — Ambulatory Visit (INDEPENDENT_AMBULATORY_CARE_PROVIDER_SITE_OTHER): Payer: Medicare HMO | Admitting: Family Medicine

## 2018-09-27 ENCOUNTER — Encounter: Payer: Self-pay | Admitting: Family Medicine

## 2018-09-27 VITALS — BP 130/68 | HR 82 | Temp 98.8°F | Wt 218.2 lb

## 2018-09-27 DIAGNOSIS — J019 Acute sinusitis, unspecified: Secondary | ICD-10-CM

## 2018-09-27 MED ORDER — HYDROCODONE-HOMATROPINE 5-1.5 MG/5ML PO SYRP: 5.0000 mL | ORAL_SOLUTION | ORAL | 0 refills | Status: DC | PRN

## 2018-09-27 MED ORDER — AZITHROMYCIN 250 MG PO TABS: ORAL_TABLET | ORAL | 0 refills | Status: DC

## 2018-09-27 NOTE — Progress Notes (Signed)
   Subjective:    Patient ID: Ronald Franklin, male    DOB: 09-14-1950, 68 y.o.   MRN: [id number]  HPI Here for one week of sinus pressure, PND, and blowing yellow mucus from the nose. Lots of dry coughing but no fever. Using a Netty pot, Mucinex, and Dayquil.    Review of Systems  Constitutional: Negative.   HENT: Positive for congestion, postnasal drip and sinus pressure. Negative for sinus pain and sore throat.   Eyes: Negative.   Respiratory: Positive for cough.        Objective:   Physical Exam Constitutional:      Appearance: Normal appearance.  HENT:     Right Ear: Tympanic membrane and ear canal normal.     Left Ear: Tympanic membrane and ear canal normal.     Nose: Nose normal.     Mouth/Throat:     Pharynx: Oropharynx is clear.  Eyes:     Conjunctiva/sclera: Conjunctivae normal.  Pulmonary:     Effort: Pulmonary effort is normal.     Breath sounds: Normal breath sounds.  Lymphadenopathy:     Cervical: No cervical adenopathy.  Neurological:     Mental Status: He is alert.           Assessment & Plan:  Sinusitis, treat with a Zpack.  Alysia Penna, MD

## 2018-10-13 DIAGNOSIS — H18831 Recurrent erosion of cornea, right eye: Secondary | ICD-10-CM | POA: Diagnosis not present

## 2018-10-18 ENCOUNTER — Encounter: Payer: Self-pay | Admitting: Neurology

## 2018-10-27 ENCOUNTER — Other Ambulatory Visit (INDEPENDENT_AMBULATORY_CARE_PROVIDER_SITE_OTHER): Payer: Medicare HMO

## 2018-10-27 ENCOUNTER — Encounter: Payer: Self-pay | Admitting: Internal Medicine

## 2018-10-27 ENCOUNTER — Telehealth: Payer: Self-pay | Admitting: Internal Medicine

## 2018-10-27 ENCOUNTER — Ambulatory Visit (INDEPENDENT_AMBULATORY_CARE_PROVIDER_SITE_OTHER): Payer: Medicare HMO | Admitting: Internal Medicine

## 2018-10-27 ENCOUNTER — Other Ambulatory Visit: Payer: Self-pay

## 2018-10-27 VITALS — BP 140/80 | HR 67 | Temp 98.7°F | Ht 72.0 in | Wt 214.0 lb

## 2018-10-27 DIAGNOSIS — G8929 Other chronic pain: Secondary | ICD-10-CM | POA: Diagnosis not present

## 2018-10-27 DIAGNOSIS — N528 Other male erectile dysfunction: Secondary | ICD-10-CM

## 2018-10-27 DIAGNOSIS — E785 Hyperlipidemia, unspecified: Secondary | ICD-10-CM | POA: Diagnosis not present

## 2018-10-27 DIAGNOSIS — Z0000 Encounter for general adult medical examination without abnormal findings: Secondary | ICD-10-CM

## 2018-10-27 DIAGNOSIS — M1612 Unilateral primary osteoarthritis, left hip: Secondary | ICD-10-CM

## 2018-10-27 DIAGNOSIS — N400 Enlarged prostate without lower urinary tract symptoms: Secondary | ICD-10-CM | POA: Diagnosis not present

## 2018-10-27 DIAGNOSIS — M25552 Pain in left hip: Secondary | ICD-10-CM

## 2018-10-27 LAB — LIPID PANEL
Cholesterol: 232 mg/dL — ABNORMAL HIGH (ref 0–200)
HDL: 43.5 mg/dL (ref >39.00)
LDL Cholesterol: 157 mg/dL — ABNORMAL HIGH (ref 0–99)
NonHDL: 188.58
Total CHOL/HDL Ratio: 5
Triglycerides: 157 mg/dL — ABNORMAL HIGH (ref 0.0–149.0)
VLDL: 31.4 mg/dL (ref 0.0–40.0)

## 2018-10-27 LAB — URINALYSIS, ROUTINE W REFLEX MICROSCOPIC
Bilirubin Urine: NEGATIVE
Hgb urine dipstick: NEGATIVE
KETONES UR: NEGATIVE
Leukocytes,Ua: NEGATIVE
NITRITE: NEGATIVE
PH: 6 (ref 5.0–8.0)
Specific Gravity, Urine: >=1.03 — AB (ref 1.000–1.030)
Total Protein, Urine: NEGATIVE
Urine Glucose: NEGATIVE
Urobilinogen, UA: 0.2 (ref 0.0–1.0)
WBC UA: NONE SEEN (ref 0–?)

## 2018-10-27 LAB — CBC WITH DIFFERENTIAL/PLATELET
Basophils Absolute: 0.1 10*3/uL (ref 0.0–0.1)
Basophils Relative: 1 % (ref 0.0–3.0)
Eosinophils Absolute: 0.3 10*3/uL (ref 0.0–0.7)
Eosinophils Relative: 4.1 % (ref 0.0–5.0)
HCT: 42.1 % (ref 39.0–52.0)
Hemoglobin: 14.3 g/dL (ref 13.0–17.0)
LYMPHS ABS: 2.7 10*3/uL (ref 0.7–4.0)
Lymphocytes Relative: 42.3 % (ref 12.0–46.0)
MCHC: 33.9 g/dL (ref 30.0–36.0)
MCV: 91 fl (ref 78.0–100.0)
Monocytes Absolute: 0.7 10*3/uL (ref 0.1–1.0)
Monocytes Relative: 11.2 % (ref 3.0–12.0)
Neutro Abs: 2.6 10*3/uL (ref 1.4–7.7)
Neutrophils Relative %: 41.4 % — ABNORMAL LOW (ref 43.0–77.0)
Platelets: 177 10*3/uL (ref 150.0–400.0)
RBC: 4.63 Mil/uL (ref 4.22–5.81)
RDW: 13.6 % (ref 11.5–15.5)
WBC: 6.3 10*3/uL (ref 4.0–10.5)

## 2018-10-27 LAB — COMPREHENSIVE METABOLIC PANEL
ALK PHOS: 75 U/L (ref 39–117)
ALT: 30 U/L (ref 0–53)
AST: 22 U/L (ref 0–37)
Albumin: 4.5 g/dL (ref 3.5–5.2)
BUN: 20 mg/dL (ref 6–23)
CO2: 26 mEq/L (ref 19–32)
Calcium: 9.7 mg/dL (ref 8.4–10.5)
Chloride: 105 mEq/L (ref 96–112)
Creatinine, Ser: 0.88 mg/dL (ref 0.40–1.50)
GFR: 85.9 mL/min (ref >60.00)
Glucose, Bld: 85 mg/dL (ref 70–99)
Potassium: 4.2 mEq/L (ref 3.5–5.1)
Sodium: 138 mEq/L (ref 135–145)
Total Bilirubin: 0.4 mg/dL (ref 0.2–1.2)
Total Protein: 7 g/dL (ref 6.0–8.3)

## 2018-10-27 LAB — TSH: TSH: 2.69 u[IU]/mL (ref 0.35–4.50)

## 2018-10-27 LAB — PSA: PSA: 1.75 ng/mL (ref 0.10–4.00)

## 2018-10-27 MED ORDER — ATORVASTATIN CALCIUM 40 MG PO TABS: 40.0000 mg | ORAL_TABLET | Freq: Every day | ORAL | 1 refills | Status: DC

## 2018-10-27 NOTE — Patient Instructions (Signed)

## 2018-10-27 NOTE — Telephone Encounter (Signed)
Copied from [place] [id number]. Topic: General - Inquiry >> Oct 27, 2018  4:25 PM Reyne Dumas L wrote: Reason for CRM:   Pt had blood work today and he wants to know if his Ruben Im can be checked using what was taken earlier today. Pt can be reached at [phone]

## 2018-10-27 NOTE — Progress Notes (Signed)
Subjective:  Patient ID: Ronald Franklin, male    DOB: Oct 14, 1950  Age: 68 y.o. MRN: [id number]  CC: Annual Exam and Hyperlipidemia   HPI Ronald Franklin presents for a CPX.  He complains of a several year history of worsening  L hip pain.  He saw an orthopedist 3 years ago and a plain film was positive for degenerative joint disease.  He has started to develop a decrease in the range of motion of the left hip.  He occasionally takes a dose of Aleve to control the pain.  He complains of ED and wants to check his testosterone level.  He is very active and denies any recent episodes of CP, DOE, palpitations, edema, or fatigue.  Past Medical History:  Diagnosis Date  . Allergic rhinitis   . Arthritis   . CTS (carpal tunnel syndrome)   . OSA (obstructive sleep apnea)    Past Surgical History:  Procedure Laterality Date  . BACK SURGERY     C4-5-6   2 cervical    from [facility]  . CARPAL TUNNEL RELEASE  09/16/2011   Procedure: CARPAL TUNNEL RELEASE;  Surgeon: Floyce Stakes, MD;  Location: [facility];  Service: Neurosurgery;  Laterality: Left;  Left Carpal Tunnel Release  . CERVICAL LAMINECTOMY    . KNEE ARTHROSCOPY WITH MEDIAL MENISECTOMY  07/06/2012   Procedure: KNEE ARTHROSCOPY WITH MEDIAL MENISECTOMY;  Surgeon: Magnus Sinning, MD;  Location: [facility];  Service: Orthopedics;  Laterality: Left;  LEFT KNEE ARTHROSCOPY WITH PARTIAL MEDIAL MENISECTOMY  . KNEE SURGERY    . NECK SURGERY    . ROTATOR CUFF REPAIR     bilateral     reports that he has never smoked. He has never used smokeless tobacco. He reports current alcohol use of about 5.0 standard drinks of alcohol per week. He reports that he does not use drugs. family history includes Diabetes in his brother and brother; Heart attack in his father and mother; Hyperlipidemia in his unknown relative; Prostate cancer in his unknown relative. No Known Allergies  Outpatient Medications Prior to Visit  Medication Sig Dispense Refill  .  Ascorbic Acid (VITAMIN C) 1000 MG tablet Take 1,000 mg by mouth daily.     Marland Kitchen aspirin 81 MG tablet Take 1 tablet (81 mg total) by mouth daily. 90 tablet 3  . cholecalciferol (VITAMIN D) 1000 UNITS tablet Take 1,000 Units by mouth daily.     . Cyanocobalamin (VITAMIN B 12 PO) Take 1 tablet by mouth daily.    . Multiple Vitamins-Minerals (CENTRUM SILVER PO) Take 1 tablet by mouth daily.     . Turmeric 500 MG CAPS Take by mouth.    Marland Kitchen azithromycin (ZITHROMAX Z-PAK) 250 MG tablet As directed 6 each 0  . glucosamine-chondroitin 500-400 MG tablet Take 1 tablet by mouth daily.     Marland Kitchen HYDROcodone-homatropine (HYDROMET) 5-1.5 MG/5ML syrup Take 5 mLs by mouth every 4 (four) hours as needed. 240 mL 0  . NON FORMULARY Take 1 capsule by mouth daily. muscadine seed 650 mg    . vitamin E 800 UNIT capsule Take 800 Units by mouth daily.      No facility-administered medications prior to visit.     ROS Review of Systems  Constitutional: Negative.  Negative for diaphoresis, fatigue and unexpected weight change.  HENT: Negative.   Eyes: Negative for visual disturbance.  Respiratory: Negative.  Negative for apnea, cough, shortness of breath and wheezing.   Cardiovascular: Negative for chest  pain, palpitations and leg swelling.  Gastrointestinal: Negative for abdominal pain, constipation, diarrhea, nausea and vomiting.  Endocrine: Negative.   Genitourinary: Negative.  Negative for difficulty urinating, dysuria, penile pain, penile swelling, scrotal swelling, testicular pain and urgency.       + ED  Musculoskeletal: Positive for arthralgias. Negative for back pain, myalgias and neck pain.  Skin: Negative.  Negative for color change and pallor.  Neurological: Negative.  Negative for dizziness, weakness, light-headedness and headaches.  Hematological: Negative for adenopathy. Does not bruise/bleed easily.  Psychiatric/Behavioral: Negative.     Objective:  BP 140/80 (BP Location: Left Arm, Patient Position:  Sitting, Cuff Size: Large)   Pulse 67   Temp 98.7 F (37.1 C) (Oral)   Ht 6' (1.829 m)   Wt 214 lb (97.1 kg)   SpO2 97%   BMI 29.02 kg/m   BP Readings from Last 3 Encounters:  10/27/18 140/80  09/27/18 130/68  10/25/17 122/80    Wt Readings from Last 3 Encounters:  10/27/18 214 lb (97.1 kg)  09/27/18 218 lb 3.2 oz (99 kg)  10/25/17 214 lb 9.6 oz (97.3 kg)    Physical Exam Vitals signs reviewed. Exam conducted with a chaperone present.  Constitutional:      Appearance: He is not ill-appearing or diaphoretic.  HENT:     Nose: Nose normal. No congestion or rhinorrhea.     Mouth/Throat:     Mouth: Mucous membranes are moist.     Pharynx: Oropharynx is clear. No oropharyngeal exudate or posterior oropharyngeal erythema.  Eyes:     General: No scleral icterus.    Conjunctiva/sclera: Conjunctivae normal.  Neck:     Musculoskeletal: Normal range of motion. No neck rigidity or muscular tenderness.  Cardiovascular:     Rate and Rhythm: Normal rate and regular rhythm.     Heart sounds: No murmur. No gallop.   Pulmonary:     Effort: Pulmonary effort is normal.     Breath sounds: No stridor. No wheezing, rhonchi or rales.  Abdominal:     General: Abdomen is flat. Bowel sounds are normal.     Palpations: There is no hepatomegaly, splenomegaly or mass.     Tenderness: There is no abdominal tenderness.     Hernia: There is no hernia in the right inguinal area or left inguinal area.  Genitourinary:    Pubic Area: No rash.      Penis: Normal and circumcised. No discharge, swelling or lesions.      Scrotum/Testes: Normal.        Right: Mass or tenderness not present.        Left: Mass or tenderness not present.     Epididymis:     Right: Normal.     Left: Normal.     Prostate: Enlarged (1+ smooth symm BPH). Not tender and no nodules present.     Rectum: Normal. Guaiac result negative. No mass, tenderness, anal fissure, external hemorrhoid or internal hemorrhoid. Normal anal  tone.  Musculoskeletal:     Left hip: He exhibits decreased range of motion. He exhibits no tenderness, no bony tenderness, no swelling, no crepitus and no deformity.  Lymphadenopathy:     Cervical: No cervical adenopathy.     Lower Body: No right inguinal adenopathy. No left inguinal adenopathy.     Lab Results  Component Value Date   WBC 6.3 10/27/2018   HGB 14.3 10/27/2018   HCT 42.1 10/27/2018   PLT 177.0 10/27/2018   GLUCOSE 85 10/27/2018  CHOL 232 (H) 10/27/2018   TRIG 157.0 (H) 10/27/2018   HDL 43.50 10/27/2018   LDLDIRECT 206.6 08/04/2013   LDLCALC 157 (H) 10/27/2018   ALT 30 10/27/2018   AST 22 10/27/2018   NA 138 10/27/2018   K 4.2 10/27/2018   CL 105 10/27/2018   CREATININE 0.88 10/27/2018   BUN 20 10/27/2018   CO2 26 10/27/2018   TSH 2.69 10/27/2018   PSA 1.75 10/27/2018   INR 1.0 07/02/2008    Dg Esophagus  Result Date: 11/08/2017 CLINICAL DATA:  Dysphagia EXAM: ESOPHOGRAM / BARIUM SWALLOW / BARIUM TABLET STUDY TECHNIQUE: Combined double contrast and single contrast examination performed using effervescent crystals, thick barium liquid, and thin barium liquid. The patient was observed with fluoroscopy swallowing a 13 mm barium sulphate tablet. FLUOROSCOPY TIME:  Fluoroscopy Time:  1 minutes 54 seconds Radiation Exposure Index (if provided by the fluoroscopic device): 242 mGy Number of Acquired Spot Images: 0 COMPARISON:  None. FINDINGS: Initially double-contrast barium swallow was performed. The mucosa of the esophagus is unremarkable. No ulceration or mass is seen. Rapid sequence spot films of the cervical esophagus were then performed. The swallowing mechanism is unremarkable. No penetration or aspiration is seen. Slight prominence of the cricopharyngeus muscle is noted. Esophageal peristalsis appears normal. No hiatal hernia is seen. Only faint gastroesophageal reflux is demonstrated. A barium pill was given at the end the study which passed into the stomach  without delay. Incidentally noted was a apparent air-filled structure overlying the lesser curvature of the stomach. Several images were taken showing this to persist. However it is noted on the erect view at the end of the study the this collection fills with barium and air and is consistent with a somewhat atypical gastric diverticulum. IMPRESSION: 1. Mild gastroesophageal reflux. Barium pill passes into the stomach without delay. 2. Normal esophageal peristalsis. 3. Incidental small diverticulum along the lesser curvature of the stomach. Electronically Signed   By: Ivar Drape M.D.   On: 11/08/2017 13:25    Assessment & Plan:   Marc was seen today for annual exam and hyperlipidemia.  Diagnoses and all orders for this visit:  Benign prostatic hyperplasia without lower urinary tract symptoms-his PSA is normal.  This is reassuring that he does not have prostate cancer.  He has no symptoms that need to be treated. -     PSA; Future -     Urinalysis, Routine w reflex microscopic; Future  Hyperlipidemia with target LDL less than 160-he has an elevated ASCVD risk score so I have asked him to start taking a statin for CV risk reduction. -     Lipid panel; Future -     CBC with Differential/Platelet; Future -     Comprehensive metabolic panel; Future -     TSH; Future -     atorvastatin (LIPITOR) 40 MG tablet; Take 1 tablet (40 mg total) by mouth daily.  Routine general medical examination at a health care facility  Chronic left hip pain- I have asked him to undergo an MRI to see if he has a musculoskeletal hip disorder that would be appropriately treated with orthopedic surgery. -     MR HIP LEFT WO CONTRAST; Future  Primary osteoarthritis of left hip -     MR HIP LEFT WO CONTRAST; Future   I have discontinued Theotis K. Belote "Ken"'s vitamin E, glucosamine-chondroitin, NON FORMULARY, azithromycin, and HYDROcodone-homatropine. I am also having him start on atorvastatin. Additionally, I am  having him maintain his  Multiple Vitamins-Minerals (CENTRUM SILVER PO), cholecalciferol, vitamin C, Cyanocobalamin (VITAMIN B 12 PO), aspirin, and Turmeric.  Meds ordered this encounter  Medications  . atorvastatin (LIPITOR) 40 MG tablet    Sig: Take 1 tablet (40 mg total) by mouth daily.    Dispense:  90 tablet    Refill:  1   See AVS for instructions about healthy living and anticipatory guidance.  Follow-up: Return in about 6 months (around 04/29/2019).  Scarlette Calico, MD

## 2018-10-28 NOTE — Telephone Encounter (Signed)
Called lab and they stated that they could add on the testosterone with the labs drawn yesterday, however, the samples are only good for 2 days so they will not make it to Monday.   Left detailed message informing pt of same.  Will call back after approval/denial from PCP.

## 2018-10-28 NOTE — Telephone Encounter (Signed)
Pt would like testosterone lab.   Can this be entered?

## 2018-10-29 DIAGNOSIS — N528 Other male erectile dysfunction: Secondary | ICD-10-CM | POA: Insufficient documentation

## 2018-10-29 NOTE — Assessment & Plan Note (Signed)

## 2018-10-29 NOTE — Telephone Encounter (Signed)
Testosterone level ordered.

## 2018-10-31 NOTE — Telephone Encounter (Signed)
Pt informed and will have lab done when he returns from his trip.

## 2018-11-17 DIAGNOSIS — M7062 Trochanteric bursitis, left hip: Secondary | ICD-10-CM | POA: Diagnosis not present

## 2018-11-17 DIAGNOSIS — M25552 Pain in left hip: Secondary | ICD-10-CM | POA: Diagnosis not present

## 2018-12-22 DIAGNOSIS — G5601 Carpal tunnel syndrome, right upper limb: Secondary | ICD-10-CM | POA: Diagnosis not present

## 2018-12-26 DIAGNOSIS — D0472 Carcinoma in situ of skin of left lower limb, including hip: Secondary | ICD-10-CM | POA: Diagnosis not present

## 2018-12-26 DIAGNOSIS — D485 Neoplasm of uncertain behavior of skin: Secondary | ICD-10-CM | POA: Diagnosis not present

## 2018-12-26 DIAGNOSIS — L814 Other melanin hyperpigmentation: Secondary | ICD-10-CM | POA: Diagnosis not present

## 2018-12-26 DIAGNOSIS — L821 Other seborrheic keratosis: Secondary | ICD-10-CM | POA: Diagnosis not present

## 2018-12-26 DIAGNOSIS — L570 Actinic keratosis: Secondary | ICD-10-CM | POA: Diagnosis not present

## 2018-12-27 DIAGNOSIS — M7062 Trochanteric bursitis, left hip: Secondary | ICD-10-CM | POA: Diagnosis not present

## 2018-12-27 DIAGNOSIS — M25551 Pain in right hip: Secondary | ICD-10-CM | POA: Diagnosis not present

## 2018-12-27 DIAGNOSIS — G5601 Carpal tunnel syndrome, right upper limb: Secondary | ICD-10-CM | POA: Diagnosis not present

## 2018-12-27 DIAGNOSIS — M1612 Unilateral primary osteoarthritis, left hip: Secondary | ICD-10-CM | POA: Diagnosis not present

## 2019-01-03 ENCOUNTER — Other Ambulatory Visit: Payer: Self-pay

## 2019-01-03 ENCOUNTER — Ambulatory Visit (INDEPENDENT_AMBULATORY_CARE_PROVIDER_SITE_OTHER): Payer: Medicare HMO | Admitting: Internal Medicine

## 2019-01-03 ENCOUNTER — Encounter: Payer: Self-pay | Admitting: Internal Medicine

## 2019-01-03 VITALS — BP 144/82 | HR 62 | Temp 98.2°F | Resp 16 | Ht 72.0 in | Wt 215.0 lb

## 2019-01-03 DIAGNOSIS — I10 Essential (primary) hypertension: Secondary | ICD-10-CM | POA: Diagnosis not present

## 2019-01-03 NOTE — Progress Notes (Signed)
Subjective:  Patient ID: Ronald Franklin, male    DOB: [birth year]  Age: 68 y.o. MRN: [id number]  CC: Hypertension   HPI Ronald Franklin presents for a BP check - Since I last saw him he has added a roof onto his house.  He is very active and denies any recent episodes of CP, DOE, palpitations, edema, or fatigue.  He occasionally takes an anti-inflammatory but does not take decongestants.  Outpatient Medications Prior to Visit  Medication Sig Dispense Refill  . Ascorbic Acid (VITAMIN C) 1000 MG tablet Take 1,000 mg by mouth daily.     Marland Kitchen aspirin 81 MG tablet Take 1 tablet (81 mg total) by mouth daily. 90 tablet 3  . atorvastatin (LIPITOR) 40 MG tablet Take 1 tablet (40 mg total) by mouth daily. 90 tablet 1  . cholecalciferol (VITAMIN D) 1000 UNITS tablet Take 1,000 Units by mouth daily.     . Cyanocobalamin (VITAMIN B 12 PO) Take 1 tablet by mouth daily.    . Multiple Vitamins-Minerals (CENTRUM SILVER PO) Take 1 tablet by mouth daily.     . naproxen sodium (ALEVE) 220 MG tablet as needed.    . Turmeric 500 MG CAPS Take by mouth.     No facility-administered medications prior to visit.     ROS Review of Systems  Constitutional: Negative for diaphoresis and fatigue.  HENT: Negative.   Eyes: Negative for visual disturbance.  Respiratory: Negative for cough, chest tightness, shortness of breath and wheezing.   Cardiovascular: Negative for chest pain, palpitations and leg swelling.  Gastrointestinal: Negative for abdominal pain, diarrhea, nausea and vomiting.  Endocrine: Negative.   Genitourinary: Negative.  Negative for difficulty urinating and hematuria.  Musculoskeletal: Negative.  Negative for arthralgias and myalgias.  Skin: Negative for color change and pallor.  Neurological: Negative.  Negative for dizziness, weakness and light-headedness.  Hematological: Negative for adenopathy. Does not bruise/bleed easily.  Psychiatric/Behavioral: Negative.     Objective:  BP (!) 144/82  (BP Location: Left Arm, Patient Position: Sitting, Cuff Size: Large)   Pulse 62   Temp 98.2 F (36.8 C) (Oral)   Resp 16   Ht 6' (1.829 m)   Wt 215 lb (97.5 kg)   SpO2 99%   BMI 29.16 kg/m   BP Readings from Last 3 Encounters:  01/03/19 (!) 144/82  10/27/18 140/80  09/27/18 130/68    Wt Readings from Last 3 Encounters:  01/03/19 215 lb (97.5 kg)  10/27/18 214 lb (97.1 kg)  09/27/18 218 lb 3.2 oz (99 kg)    Physical Exam Vitals signs reviewed.  Constitutional:      Appearance: He is not ill-appearing or diaphoretic.  HENT:     Nose: Nose normal.     Mouth/Throat:     Mouth: Mucous membranes are moist.     Pharynx: No oropharyngeal exudate.  Eyes:     General: No scleral icterus.    Conjunctiva/sclera: Conjunctivae normal.  Neck:     Musculoskeletal: Normal range of motion. No neck rigidity.  Cardiovascular:     Rate and Rhythm: Normal rate and regular rhythm.     Heart sounds: No murmur. No gallop.   Pulmonary:     Effort: Pulmonary effort is normal. No respiratory distress.     Breath sounds: No stridor. No wheezing, rhonchi or rales.  Abdominal:     General: Abdomen is flat.     Palpations: There is no hepatomegaly, splenomegaly or mass.     Tenderness: There  is no abdominal tenderness. There is no guarding.  Musculoskeletal: Normal range of motion.        General: No swelling.     Right lower leg: No edema.     Left lower leg: No edema.  Lymphadenopathy:     Cervical: No cervical adenopathy.  Skin:    General: Skin is warm.  Neurological:     General: No focal deficit present.  Psychiatric:        Mood and Affect: Mood normal.     Lab Results  Component Value Date   WBC 6.3 10/27/2018   HGB 14.3 10/27/2018   HCT 42.1 10/27/2018   PLT 177.0 10/27/2018   GLUCOSE 85 10/27/2018   CHOL 232 (H) 10/27/2018   TRIG 157.0 (H) 10/27/2018   HDL 43.50 10/27/2018   LDLDIRECT 206.6 08/04/2013   LDLCALC 157 (H) 10/27/2018   ALT 30 10/27/2018   AST 22  10/27/2018   NA 138 10/27/2018   K 4.2 10/27/2018   CL 105 10/27/2018   CREATININE 0.88 10/27/2018   BUN 20 10/27/2018   CO2 26 10/27/2018   TSH 2.69 10/27/2018   PSA 1.75 10/27/2018   INR 1.0 07/02/2008    Dg Esophagus  Result Date: 11/08/2017 CLINICAL DATA:  Dysphagia EXAM: ESOPHOGRAM / BARIUM SWALLOW / BARIUM TABLET STUDY TECHNIQUE: Combined double contrast and single contrast examination performed using effervescent crystals, thick barium liquid, and thin barium liquid. The patient was observed with fluoroscopy swallowing a 13 mm barium sulphate tablet. FLUOROSCOPY TIME:  Fluoroscopy Time:  1 minutes 54 seconds Radiation Exposure Index (if provided by the fluoroscopic device): 242 mGy Number of Acquired Spot Images: 0 COMPARISON:  None. FINDINGS: Initially double-contrast barium swallow was performed. The mucosa of the esophagus is unremarkable. No ulceration or mass is seen. Rapid sequence spot films of the cervical esophagus were then performed. The swallowing mechanism is unremarkable. No penetration or aspiration is seen. Slight prominence of the cricopharyngeus muscle is noted. Esophageal peristalsis appears normal. No hiatal hernia is seen. Only faint gastroesophageal reflux is demonstrated. A barium pill was given at the end the study which passed into the stomach without delay. Incidentally noted was a apparent air-filled structure overlying the lesser curvature of the stomach. Several images were taken showing this to persist. However it is noted on the erect view at the end of the study the this collection fills with barium and air and is consistent with a somewhat atypical gastric diverticulum. IMPRESSION: 1. Mild gastroesophageal reflux. Barium pill passes into the stomach without delay. 2. Normal esophageal peristalsis. 3. Incidental small diverticulum along the lesser curvature of the stomach. Electronically Signed   By: Ronald Franklin M.D.   On: 11/08/2017 13:25    Assessment & Plan:    Ronald Franklin was seen today for hypertension.  Diagnoses and all orders for this visit:  Essential hypertension- He has developed stage I hypertension.  His recent labs were negative for secondary causes or endorgan damage.  He is being treated for sleep apnea.  He is not willing to take an antihypertensive.  I have asked him to avoid NSAIDs as much as possible.  Will recheck his blood pressure in 3 to 4 months and will reconsider whether or not an antihypertensive is indicated at that time.   I am having Yonis K. Tomaselli "Yvone Neu" maintain his Multiple Vitamins-Minerals (CENTRUM SILVER PO), cholecalciferol, vitamin C, Cyanocobalamin (VITAMIN B 12 PO), aspirin, Turmeric, atorvastatin, and naproxen sodium.  No orders of the defined types  were placed in this encounter.    Follow-up: Return in about 4 months (around 05/06/2019).  Scarlette Calico, MD

## 2019-01-03 NOTE — Patient Instructions (Signed)

## 2019-01-04 ENCOUNTER — Encounter: Payer: Self-pay | Admitting: Internal Medicine

## 2019-01-04 DIAGNOSIS — I10 Essential (primary) hypertension: Secondary | ICD-10-CM | POA: Insufficient documentation

## 2019-01-06 ENCOUNTER — Encounter: Payer: Self-pay | Admitting: Internal Medicine

## 2019-01-10 ENCOUNTER — Encounter: Payer: Self-pay | Admitting: Internal Medicine

## 2019-01-10 NOTE — Telephone Encounter (Signed)
Patient is calling in stating he is checking in on status of forms.

## 2019-01-10 NOTE — Telephone Encounter (Signed)
Pt calling back to check status. Please advise.

## 2019-01-10 NOTE — Telephone Encounter (Signed)
Told patient we would confirm if we have the form once the nurse returns, I also asked him to get another copy sent [place] just in case. Please call back and confirm whether we have the form or not

## 2019-01-10 NOTE — Telephone Encounter (Signed)
Copied from [place] [id number]. Topic: General - Inquiry >> Jan 06, 2019  5:18 PM Selinda Flavin B, [place] wrote: Reason for CRM: Patient calling to check the status of the surgical clearance forms that he brought to the office on 01/03/2019. Patient states that he is needing this paperwork done asap due to his surgery currently being scheduled on 01/12/2019. Please advise.  CB#: [phone]

## 2019-01-10 NOTE — Telephone Encounter (Signed)
I do not have these forms  

## 2019-01-11 NOTE — Progress Notes (Signed)
Please place orders in Epic as patient is being scheduled for a pre-op appointment! Thank you! 

## 2019-01-13 ENCOUNTER — Encounter (HOSPITAL_COMMUNITY): Payer: Self-pay

## 2019-01-13 ENCOUNTER — Ambulatory Visit: Payer: Self-pay | Admitting: Orthopedic Surgery

## 2019-01-13 NOTE — H&P (View-Only) (Signed)
TOTAL HIP ADMISSION H&P  Patient is admitted for left total hip arthroplasty.  Subjective:  Chief Complaint: left hip pain  HPI: Ronald Franklin, 68 y.o. male, has a history of pain and functional disability in the left hip(s) due to arthritis and patient has failed non-surgical conservative treatments for greater than 12 weeks to include NSAID's and/or analgesics, flexibility and strengthening excercises, use of assistive devices, weight reduction as appropriate and activity modification.  Onset of symptoms was gradual starting 3 years ago with rapidlly worsening course since that time.The patient noted no past surgery on the left hip(s).  Patient currently rates pain in the left hip at 10 out of 10 with activity. Patient has night pain, worsening of pain with activity and weight bearing, trendelenberg gait, pain that interfers with activities of daily living, pain with passive range of motion and crepitus. Patient has evidence of subchondral cysts, subchondral sclerosis, periarticular osteophytes and joint space narrowing by imaging studies. This condition presents safety issues increasing the risk of falls.   There is no current active infection.  Patient Active Problem List   Diagnosis Date Noted  . Essential hypertension 01/04/2019  . Other male erectile dysfunction 10/29/2018  . Primary osteoarthritis of left hip 10/27/2018  . Oropharyngeal dysphagia 10/25/2017  . OSA on CPAP 12/17/2016  . Chronic left hip pain 10/22/2016  . BPH (benign prostatic hyperplasia) 10/07/2015  . Hyperlipidemia with target LDL less than 160 10/04/2014  . Hearing loss 10/04/2014  . Routine general medical examination at a health care facility 09/17/2011   Past Medical History:  Diagnosis Date  . Allergic rhinitis   . Arthritis   . CTS (carpal tunnel syndrome)   . OSA (obstructive sleep apnea)     Past Surgical History:  Procedure Laterality Date  . BACK SURGERY     C4-5-6   2 cervical    from AA  .  CARPAL TUNNEL RELEASE  09/16/2011   Procedure: CARPAL TUNNEL RELEASE;  Surgeon: Floyce Stakes, MD;  Location: [facility];  Service: Neurosurgery;  Laterality: Left;  Left Carpal Tunnel Release  . CERVICAL LAMINECTOMY    . KNEE ARTHROSCOPY WITH MEDIAL MENISECTOMY  07/06/2012   Procedure: KNEE ARTHROSCOPY WITH MEDIAL MENISECTOMY;  Surgeon: Magnus Sinning, MD;  Location: [facility];  Service: Orthopedics;  Laterality: Left;  LEFT KNEE ARTHROSCOPY WITH PARTIAL MEDIAL MENISECTOMY  . KNEE SURGERY    . NECK SURGERY    . ROTATOR CUFF REPAIR     bilateral     Current Outpatient Medications  Medication Sig Dispense Refill Last Dose  . Ascorbic Acid (VITAMIN C) 1000 MG tablet Take 2,000 mg by mouth daily.    Taking  . aspirin EC 81 MG tablet Take 81 mg by mouth daily.     Marland Kitchen atorvastatin (LIPITOR) 40 MG tablet Take 1 tablet (40 mg total) by mouth daily. (Patient not taking: Reported on 01/12/2019) 90 tablet 1 Not Taking at Unknown time  . Cholecalciferol (VITAMIN D-3) 125 MCG (5000 UT) TABS Take 5,000 Units by mouth daily.     . Cyanocobalamin (VITAMIN B-12) 2500 MCG SUBL Place 2,500 mcg under the tongue daily.     . Misc Natural Products (OSTEO BI-FLEX TRIPLE STRENGTH PO) Take 1 tablet by mouth daily.     Marland Kitchen MUCINEX SINUS-MAX CLEAR & COOL 0.05 % nasal spray Place 1 spray into both nostrils 2 (two) times daily as needed for congestion.     . Multiple Vitamin (MULTIVITAMIN WITH MINERALS) TABS  tablet Take 1 tablet by mouth daily. One-A-Day Men's Health     . naproxen sodium (ALEVE) 220 MG tablet Take 220 mg by mouth 2 (two) times daily as needed (pain.).    Taking  . Turmeric 500 MG CAPS Take 500 mg by mouth daily.    Taking  . vitamin E 400 UNIT capsule Take 800 Units by mouth daily.     . Zinc 50 MG TABS Take 50 mg by mouth daily.      No current facility-administered medications for this visit.    No Known Allergies  Social History   Tobacco Use  . Smoking status: Never Smoker  . Smokeless  tobacco: Never Used  Substance Use Topics  . Alcohol use: Yes    Alcohol/week: 5.0 standard drinks    Types: 5 Glasses of wine per week    Comment: socially    Family History  Problem Relation Age of Onset  . Diabetes Brother   . Diabetes Brother   . Heart attack Mother   . Heart attack Father   . Hyperlipidemia Unknown   . Prostate cancer Unknown   . Heart disease Neg Hx   . Early death Neg Hx   . Alcohol abuse Neg Hx   . Arthritis Neg Hx   . Cancer Neg Hx   . Stroke Neg Hx   . Kidney disease Neg Hx   . Hypertension Neg Hx      Review of Systems  Constitutional: Negative.   HENT: Negative.   Eyes: Negative.   Respiratory: Negative.   Cardiovascular: Negative.   Gastrointestinal: Negative.   Genitourinary: Negative.   Musculoskeletal: Positive for joint pain.  Skin: Negative.   Neurological: Negative.   Endo/Heme/Allergies: Negative.   Psychiatric/Behavioral: Negative.     Objective:  Physical Exam  Vitals reviewed. Constitutional: He is oriented to person, place, and time. He appears well-developed and well-nourished.  HENT:  Head: Normocephalic and atraumatic.  Eyes: Pupils are equal, round, and reactive to light. Conjunctivae and EOM are normal.  Neck: Normal range of motion. Neck supple.  Cardiovascular: Normal rate, regular rhythm and intact distal pulses.  Respiratory: Effort normal. No respiratory distress.  GI: Soft. He exhibits no distension.  Genitourinary:    Genitourinary Comments: deferred   Musculoskeletal:     Left hip: He exhibits decreased range of motion and bony tenderness.  Neurological: He is alert and oriented to person, place, and time. He has normal reflexes.  Skin: Skin is warm and dry.  Psychiatric: He has a normal mood and affect. His behavior is normal. Judgment and thought content normal.    Vital signs in last 24 hours: @VSRANGES @  Labs:   Estimated body mass index is 29.16 kg/m as calculated from the following:    Height as of 01/03/19: 6' (1.829 m).   Weight as of 01/03/19: 97.5 kg.   Imaging Review Plain radiographs demonstrate severe degenerative joint disease of the left hip(s). The bone quality appears to be satisfactory for age and reported activity level.      Assessment/Plan:  End stage arthritis, left hip(s)  The patient history, physical examination, clinical judgement of the provider and imaging studies are consistent with end stage degenerative joint disease of the left hip(s) and total hip arthroplasty is deemed medically necessary. The treatment options including medical management, injection therapy, arthroscopy and arthroplasty were discussed at length. The risks and benefits of total hip arthroplasty were presented and reviewed. The risks due to aseptic loosening, infection,  stiffness, dislocation/subluxation,  thromboembolic complications and other imponderables were discussed.  The patient acknowledged the explanation, agreed to proceed with the plan and consent was signed. Patient is being admitted for inpatient treatment for surgery, pain control, PT, OT, prophylactic antibiotics, VTE prophylaxis, progressive ambulation and ADL's and discharge planning.The patient is planning to be discharged home with HEP    Patient's anticipated LOS is less than 2 midnights, meeting these requirements: - Younger than 61 - Lives within 1 hour of care - Has a competent adult at home to recover with post-op recover - NO history of  - Chronic pain requiring opiods  - Diabetes  - Coronary Artery Disease  - Heart failure  - Heart attack  - Stroke  - DVT/VTE  - Cardiac arrhythmia  - Respiratory Failure/COPD  - Renal failure  - Anemia  - Advanced Liver disease

## 2019-01-13 NOTE — H&P (Signed)
TOTAL HIP ADMISSION H&P  Patient is admitted for left total hip arthroplasty.  Subjective:  Chief Complaint: left hip pain  HPI: Ronald Franklin, 68 y.o. male, has a history of pain and functional disability in the left hip(s) due to arthritis and patient has failed non-surgical conservative treatments for greater than 12 weeks to include NSAID's and/or analgesics, flexibility and strengthening excercises, use of assistive devices, weight reduction as appropriate and activity modification.  Onset of symptoms was gradual starting 3 years ago with rapidlly worsening course since that time.The patient noted no past surgery on the left hip(s).  Patient currently rates pain in the left hip at 10 out of 10 with activity. Patient has night pain, worsening of pain with activity and weight bearing, trendelenberg gait, pain that interfers with activities of daily living, pain with passive range of motion and crepitus. Patient has evidence of subchondral cysts, subchondral sclerosis, periarticular osteophytes and joint space narrowing by imaging studies. This condition presents safety issues increasing the risk of falls.   There is no current active infection.  Patient Active Problem List   Diagnosis Date Noted  . Essential hypertension 01/04/2019  . Other male erectile dysfunction 10/29/2018  . Primary osteoarthritis of left hip 10/27/2018  . Oropharyngeal dysphagia 10/25/2017  . OSA on CPAP 12/17/2016  . Chronic left hip pain 10/22/2016  . BPH (benign prostatic hyperplasia) 10/07/2015  . Hyperlipidemia with target LDL less than 160 10/04/2014  . Hearing loss 10/04/2014  . Routine general medical examination at a health care facility 09/17/2011   Past Medical History:  Diagnosis Date  . Allergic rhinitis   . Arthritis   . CTS (carpal tunnel syndrome)   . OSA (obstructive sleep apnea)     Past Surgical History:  Procedure Laterality Date  . BACK SURGERY     C4-5-6   2 cervical    from AA  .  CARPAL TUNNEL RELEASE  09/16/2011   Procedure: CARPAL TUNNEL RELEASE;  Surgeon: Floyce Stakes, MD;  Location: [facility];  Service: Neurosurgery;  Laterality: Left;  Left Carpal Tunnel Release  . CERVICAL LAMINECTOMY    . KNEE ARTHROSCOPY WITH MEDIAL MENISECTOMY  07/06/2012   Procedure: KNEE ARTHROSCOPY WITH MEDIAL MENISECTOMY;  Surgeon: Magnus Sinning, MD;  Location: [facility];  Service: Orthopedics;  Laterality: Left;  LEFT KNEE ARTHROSCOPY WITH PARTIAL MEDIAL MENISECTOMY  . KNEE SURGERY    . NECK SURGERY    . ROTATOR CUFF REPAIR     bilateral     Current Outpatient Medications  Medication Sig Dispense Refill Last Dose  . Ascorbic Acid (VITAMIN C) 1000 MG tablet Take 2,000 mg by mouth daily.    Taking  . aspirin EC 81 MG tablet Take 81 mg by mouth daily.     Marland Kitchen atorvastatin (LIPITOR) 40 MG tablet Take 1 tablet (40 mg total) by mouth daily. (Patient not taking: Reported on 01/12/2019) 90 tablet 1 Not Taking at Unknown time  . Cholecalciferol (VITAMIN D-3) 125 MCG (5000 UT) TABS Take 5,000 Units by mouth daily.     . Cyanocobalamin (VITAMIN B-12) 2500 MCG SUBL Place 2,500 mcg under the tongue daily.     . Misc Natural Products (OSTEO BI-FLEX TRIPLE STRENGTH PO) Take 1 tablet by mouth daily.     Marland Kitchen MUCINEX SINUS-MAX CLEAR & COOL 0.05 % nasal spray Place 1 spray into both nostrils 2 (two) times daily as needed for congestion.     . Multiple Vitamin (MULTIVITAMIN WITH MINERALS) TABS  tablet Take 1 tablet by mouth daily. One-A-Day Men's Health     . naproxen sodium (ALEVE) 220 MG tablet Take 220 mg by mouth 2 (two) times daily as needed (pain.).    Taking  . Turmeric 500 MG CAPS Take 500 mg by mouth daily.    Taking  . vitamin E 400 UNIT capsule Take 800 Units by mouth daily.     . Zinc 50 MG TABS Take 50 mg by mouth daily.      No current facility-administered medications for this visit.    No Known Allergies  Social History   Tobacco Use  . Smoking status: Never Smoker  . Smokeless  tobacco: Never Used  Substance Use Topics  . Alcohol use: Yes    Alcohol/week: 5.0 standard drinks    Types: 5 Glasses of wine per week    Comment: socially    Family History  Problem Relation Age of Onset  . Diabetes Brother   . Diabetes Brother   . Heart attack Mother   . Heart attack Father   . Hyperlipidemia Unknown   . Prostate cancer Unknown   . Heart disease Neg Hx   . Early death Neg Hx   . Alcohol abuse Neg Hx   . Arthritis Neg Hx   . Cancer Neg Hx   . Stroke Neg Hx   . Kidney disease Neg Hx   . Hypertension Neg Hx      Review of Systems  Constitutional: Negative.   HENT: Negative.   Eyes: Negative.   Respiratory: Negative.   Cardiovascular: Negative.   Gastrointestinal: Negative.   Genitourinary: Negative.   Musculoskeletal: Positive for joint pain.  Skin: Negative.   Neurological: Negative.   Endo/Heme/Allergies: Negative.   Psychiatric/Behavioral: Negative.     Objective:  Physical Exam  Vitals reviewed. Constitutional: He is oriented to person, place, and time. He appears well-developed and well-nourished.  HENT:  Head: Normocephalic and atraumatic.  Eyes: Pupils are equal, round, and reactive to light. Conjunctivae and EOM are normal.  Neck: Normal range of motion. Neck supple.  Cardiovascular: Normal rate, regular rhythm and intact distal pulses.  Respiratory: Effort normal. No respiratory distress.  GI: Soft. He exhibits no distension.  Genitourinary:    Genitourinary Comments: deferred   Musculoskeletal:     Left hip: He exhibits decreased range of motion and bony tenderness.  Neurological: He is alert and oriented to person, place, and time. He has normal reflexes.  Skin: Skin is warm and dry.  Psychiatric: He has a normal mood and affect. His behavior is normal. Judgment and thought content normal.    Vital signs in last 24 hours: @VSRANGES @  Labs:   Estimated body mass index is 29.16 kg/m as calculated from the following:    Height as of 01/03/19: 6' (1.829 m).   Weight as of 01/03/19: 97.5 kg.   Imaging Review Plain radiographs demonstrate severe degenerative joint disease of the left hip(s). The bone quality appears to be satisfactory for age and reported activity level.      Assessment/Plan:  End stage arthritis, left hip(s)  The patient history, physical examination, clinical judgement of the provider and imaging studies are consistent with end stage degenerative joint disease of the left hip(s) and total hip arthroplasty is deemed medically necessary. The treatment options including medical management, injection therapy, arthroscopy and arthroplasty were discussed at length. The risks and benefits of total hip arthroplasty were presented and reviewed. The risks due to aseptic loosening, infection,  stiffness, dislocation/subluxation,  thromboembolic complications and other imponderables were discussed.  The patient acknowledged the explanation, agreed to proceed with the plan and consent was signed. Patient is being admitted for inpatient treatment for surgery, pain control, PT, OT, prophylactic antibiotics, VTE prophylaxis, progressive ambulation and ADL's and discharge planning.The patient is planning to be discharged home with HEP    Patient's anticipated LOS is less than 2 midnights, meeting these requirements: - Younger than 35 - Lives within 1 hour of care - Has a competent adult at home to recover with post-op recover - NO history of  - Chronic pain requiring opiods  - Diabetes  - Coronary Artery Disease  - Heart failure  - Heart attack  - Stroke  - DVT/VTE  - Cardiac arrhythmia  - Respiratory Failure/COPD  - Renal failure  - Anemia  - Advanced Liver disease

## 2019-01-13 NOTE — Patient Instructions (Signed)
Ronald Franklin    Your procedure is scheduled on: Thursday 01/19/19    Report to [facility] Main  Entrance Report to admitting at 10:00 AM   [place] 19 TEST ON__Today 6/1_____, THIS TEST MUST BE DONE BEFORE SURGERY, COME TO [place] ENTRANCE BETWEEN THE HOURS OF 900 AM AND 300 PM ON YOUR COVID TEST DATE.   Call this number if you have problems the morning of surgery [phone]    Remember: Do not eat food after Midnight           . BRUSH YOUR TEETH MORNING OF SURGERY AND RINSE YOUR MOUTH OUT, NO CHEWING GUM CANDY OR MINTS.   Do not eat food After Midnight.  YOU MAY HAVE CLEAR LIQUIDS FROM MIDNIGHT UNTIL 4:30AM . At 4:30AM Please finish the prescribed Pre-Surgery Gatorade drink.  Nothing by mouth after you finish the Gatorade drink !   Take these medicines the morning of surgery with A SIP OF WATER:none                                  You may not have any metal on your body including piercings               Do not wear jewelry, , lotions, powders or, deodorant               Men may shave face and neck . [place] - Preparing for Surgery  Before surgery, you can play an important role.   Because skin is not sterile, your skin needs to be as free of germs as possible.   You can reduce the number of germs on your skin by washing with CHG (chlorahexidine gluconate) soap before surgery.  CHG is an antiseptic cleaner which kills germs and bonds with the skin to continue killing germs even after washing. Please DO NOT use if you have an allergy to CHG or antibacterial soaps.  If your skin becomes reddened/irritated stop using the CHG and inform your nurse when you arrive at Short Stay. Do not shave (including legs and underarms) for at least 48 hours prior to the first CHG shower.   You may shave your face/neck.  Please follow these instructions carefully:   1.  Shower with CHG Soap the night before surgery  and the  morning of Surgery.  2.  If you choose to wash your hair, wash your hair first as usual with your  normal  shampoo.  3.  After you shampoo, rinse your hair and body thoroughly to remove the  shampoo.                                       4.  Use CHG as you would any other liquid soap.  You can apply chg directly  to the skin and wash                       Gently with a scrungie or clean washcloth.  5.  Apply the CHG Soap to your body ONLY FROM THE NECK DOWN.   Do not use on face/ open  Wound or open sores. Avoid contact with eyes, ears mouth and genitals (private parts).                       Wash face,  Genitals (private parts) with your normal soap.             6.  Wash thoroughly, paying special attention to the area where your surgery  will be performed.  7.  Thoroughly rinse your body with warm water from the neck down.  8.  DO NOT shower/wash with your normal soap after using and rinsing off  the CHG Soap.             9.  Pat yourself dry with a clean towel.            10.  Wear clean pajamas.            11.  Place clean sheets on your bed the night of your first shower and do not  sleep with pets . Day of Surgery : Do not apply any lotions/deodorants the morning of surgery.  Please wear clean clothes to the hospital/surgery center.   FAILURE TO FOLLOW THESE INSTRUCTIONS MAY RESULT IN THE CANCELLATION OF YOUR SURGERY PATIENT SIGNATURE_________________________________  NURSE SIGNATURE__________________________________  ________________________________________________________________________   Ronald Franklin  An incentive spirometer is a tool that can help keep your lungs clear and active. This tool measures how well you are filling your lungs with each breath. Taking long deep breaths may help reverse or decrease the chance of developing breathing (pulmonary) problems (especially infection) following:  A long period of time when you are  unable to move or be active. BEFORE THE PROCEDURE   If the spirometer includes an indicator to show your best effort, your nurse or respiratory therapist will set it to a desired goal.  If possible, sit up straight or lean slightly forward. Try not to slouch.  Hold the incentive spirometer in an upright position. INSTRUCTIONS FOR USE  1. Sit on the edge of your bed if possible, or sit up as far as you can in bed or on a chair. 2. Hold the incentive spirometer in an upright position. 3. Breathe out normally. 4. Place the mouthpiece in your mouth and seal your lips tightly around it. 5. Breathe in slowly and as deeply as possible, raising the piston or the ball toward the top of the column. 6. Hold your breath for 3-5 seconds or for as long as possible. Allow the piston or ball to fall to the bottom of the column. 7. Remove the mouthpiece from your mouth and breathe out normally. 8. Rest for a few seconds and repeat Steps 1 through 7 at least 10 times every 1-2 hours when you are awake. Take your time and take a few normal breaths between deep breaths. 9. The spirometer may include an indicator to show your best effort. Use the indicator as a goal to work toward during each repetition. 10. After each set of 10 deep breaths, practice coughing to be sure your lungs are clear. If you have an incision (the cut made at the time of surgery), support your incision when coughing by placing a pillow or rolled up towels firmly against it. Once you are able to get out of bed, walk around indoors and cough well. You may stop using the incentive spirometer when instructed by your caregiver.  RISKS AND COMPLICATIONS  Take your time so you do not get dizzy  or light-headed.  If you are in pain, you may need to take or ask for pain medication before doing incentive spirometry. It is harder to take a deep breath if you are having pain. AFTER USE  Rest and breathe slowly and easily.  It can be helpful to  keep track of a log of your progress. Your caregiver can provide you with a simple table to help with this. If you are using the spirometer at home, follow these instructions: [place] IF:   You are having difficultly using the spirometer.  You have trouble using the spirometer as often as instructed.  Your pain medication is not giving enough relief while using the spirometer.  You develop fever of 100.5 F (38.1 C) or higher. SEEK IMMEDIATE MEDICAL CARE IF:   You cough up bloody sputum that had not been present before.  You develop fever of 102 F (38.9 C) or greater.  You develop worsening pain at or near the incision site. MAKE SURE YOU:   Understand these instructions.  Will watch your condition.  Will get help right away if you are not doing well or get worse. Document Released: 12/14/2006 Document Revised: 10/26/2011 Document Reviewed: 02/14/2007 ExitCare Patient Information 2014 ExitCare, [place].   ________________________________________________________________________   Do not bring valuables to the hospital. [place].  Contacts, dentures or bridgework may not be worn into surgery.  Leave suitcase in the car. After surgery it may be brought to your room.     Patients discharged the day of surgery will not be allowed to drive home. IF YOU ARE HAVING SURGERY AND GOING HOME THE SAME DAY, YOU MUST HAVE AN ADULT TO DRIVE YOU HOME AND BE WITH YOU FOR 24 HOURS. YOU MAY GO HOME BY TAXI OR UBER OR ORTHERWISE, BUT AN ADULT MUST ACCOMPANY YOU HOME AND STAY WITH YOU FOR 24 HOURS.  Name and phone number of your driver:  Special Instructions: N/A              Please read over the following fact sheets you were given: _____________________________________________________________________

## 2019-01-16 ENCOUNTER — Encounter (HOSPITAL_COMMUNITY): Payer: Self-pay

## 2019-01-16 ENCOUNTER — Encounter (HOSPITAL_COMMUNITY)
Admission: RE | Admit: 2019-01-16 | Discharge: 2019-01-16 | Disposition: A | Discharge status: Home / Self Care | Payer: Medicare HMO | Source: Ambulatory Visit | Attending: Orthopedic Surgery | Admitting: Orthopedic Surgery

## 2019-01-16 ENCOUNTER — Other Ambulatory Visit: Payer: Self-pay

## 2019-01-16 ENCOUNTER — Other Ambulatory Visit (HOSPITAL_COMMUNITY)
Admission: RE | Admit: 2019-01-16 | Discharge: 2019-01-16 | Disposition: A | Discharge status: Home / Self Care | Payer: Medicare HMO | Source: Ambulatory Visit | Attending: Orthopedic Surgery | Admitting: Orthopedic Surgery

## 2019-01-16 DIAGNOSIS — I10 Essential (primary) hypertension: Secondary | ICD-10-CM | POA: Insufficient documentation

## 2019-01-16 DIAGNOSIS — Z01818 Encounter for other preprocedural examination: Secondary | ICD-10-CM | POA: Insufficient documentation

## 2019-01-16 LAB — CBC
HCT: 43.6 % (ref 39.0–52.0)
Hemoglobin: 14.4 g/dL (ref 13.0–17.0)
MCH: 31 pg (ref 26.0–34.0)
MCHC: 33 g/dL (ref 30.0–36.0)
MCV: 94 fL (ref 80.0–100.0)
Platelets: 178 10*3/uL (ref 150–400)
RBC: 4.64 MIL/uL (ref 4.22–5.81)
RDW: 12.7 % (ref 11.5–15.5)
WBC: 8.5 10*3/uL (ref 4.0–10.5)
nRBC: 0 % (ref 0.0–0.2)

## 2019-01-16 LAB — BASIC METABOLIC PANEL
Anion gap: 8 (ref 5–15)
BUN: 24 mg/dL — ABNORMAL HIGH (ref 8–23)
CO2: 27 mmol/L (ref 22–32)
Calcium: 9.7 mg/dL (ref 8.9–10.3)
Chloride: 105 mmol/L (ref 98–111)
Creatinine, Ser: 0.9 mg/dL (ref 0.61–1.24)
GFR calc Af Amer: >60 mL/min (ref >60)
GFR calc non Af Amer: >60 mL/min (ref >60)
Glucose, Bld: 103 mg/dL — ABNORMAL HIGH (ref 70–99)
Potassium: 4 mmol/L (ref 3.5–5.1)
Sodium: 140 mmol/L (ref 135–145)

## 2019-01-16 LAB — SURGICAL PCR SCREEN
MRSA, PCR: NEGATIVE
Staphylococcus aureus: POSITIVE — AB

## 2019-01-16 NOTE — Progress Notes (Addendum)
No orders at PAT visit Caryl Pina called.  Anesthesia called. CBC, Bmet,PCR screen ordered and done  ASA stopped 01/06/19 per Dr. Lyla Glassing

## 2019-01-17 DIAGNOSIS — Z833 Family history of diabetes mellitus: Secondary | ICD-10-CM | POA: Diagnosis not present

## 2019-01-17 DIAGNOSIS — Z85828 Personal history of other malignant neoplasm of skin: Secondary | ICD-10-CM | POA: Diagnosis not present

## 2019-01-17 DIAGNOSIS — Z791 Long term (current) use of non-steroidal anti-inflammatories (NSAID): Secondary | ICD-10-CM | POA: Diagnosis not present

## 2019-01-17 DIAGNOSIS — G8929 Other chronic pain: Secondary | ICD-10-CM | POA: Diagnosis not present

## 2019-01-18 LAB — NOVEL CORONAVIRUS, NAA (HOSP ORDER, SEND-OUT TO REF LAB; TAT 18-24 HRS): SARS-CoV-2, NAA: NOT DETECTED

## 2019-01-18 NOTE — Progress Notes (Signed)
SPOKE W/ patient via phone     SCREENING SYMPTOMS OF COVID 19:   COUGH--no  RUNNY NOSE--- no  SORE THROAT---no  NASAL CONGESTION----no  SNEEZING----no  SHORTNESS OF BREATH---no  DIFFICULTY BREATHING---no  TEMP >100.0 -----no  UNEXPLAINED BODY ACHES------no  CHILLS -------- no  HEADACHES ---------no  LOSS OF SMELL/ TASTE --------no    HAVE YOU OR ANY FAMILY MEMBER TRAVELLED PAST 14 DAYS OUT OF THE   COUNTY---yes. To [facility] [place] to social isolate STATE----no COUNTRY----no  HAVE YOU OR ANY FAMILY MEMBER BEEN EXPOSED TO ANYONE WITH COVID 19? no

## 2019-01-19 ENCOUNTER — Encounter (HOSPITAL_COMMUNITY): Payer: Self-pay

## 2019-01-19 ENCOUNTER — Other Ambulatory Visit: Payer: Self-pay

## 2019-01-19 ENCOUNTER — Encounter (HOSPITAL_COMMUNITY)
Admission: RE | Disposition: A | Discharge status: Home / Self Care | Payer: Self-pay | Source: Home / Self Care | Attending: Orthopedic Surgery

## 2019-01-19 ENCOUNTER — Inpatient Hospital Stay (HOSPITAL_COMMUNITY): Payer: Medicare HMO | Admitting: Certified Registered Nurse Anesthetist

## 2019-01-19 ENCOUNTER — Inpatient Hospital Stay (HOSPITAL_COMMUNITY): Payer: Medicare HMO

## 2019-01-19 ENCOUNTER — Inpatient Hospital Stay (HOSPITAL_COMMUNITY)
Admission: RE | Admit: 2019-01-19 | Discharge: 2019-01-20 | DRG: 470 | Disposition: A | Discharge status: Home / Self Care | Payer: Medicare HMO | Attending: Orthopedic Surgery | Admitting: Orthopedic Surgery

## 2019-01-19 ENCOUNTER — Inpatient Hospital Stay (HOSPITAL_COMMUNITY): Payer: Medicare HMO | Admitting: Physician Assistant

## 2019-01-19 DIAGNOSIS — M1612 Unilateral primary osteoarthritis, left hip: Secondary | ICD-10-CM | POA: Diagnosis not present

## 2019-01-19 DIAGNOSIS — Z419 Encounter for procedure for purposes other than remedying health state, unspecified: Secondary | ICD-10-CM

## 2019-01-19 DIAGNOSIS — Z79899 Other long term (current) drug therapy: Secondary | ICD-10-CM | POA: Diagnosis not present

## 2019-01-19 DIAGNOSIS — Z1159 Encounter for screening for other viral diseases: Secondary | ICD-10-CM

## 2019-01-19 DIAGNOSIS — Z7982 Long term (current) use of aspirin: Secondary | ICD-10-CM | POA: Diagnosis not present

## 2019-01-19 DIAGNOSIS — Z8249 Family history of ischemic heart disease and other diseases of the circulatory system: Secondary | ICD-10-CM

## 2019-01-19 DIAGNOSIS — I10 Essential (primary) hypertension: Secondary | ICD-10-CM | POA: Diagnosis present

## 2019-01-19 DIAGNOSIS — E785 Hyperlipidemia, unspecified: Secondary | ICD-10-CM | POA: Diagnosis present

## 2019-01-19 DIAGNOSIS — G4733 Obstructive sleep apnea (adult) (pediatric): Secondary | ICD-10-CM | POA: Diagnosis not present

## 2019-01-19 DIAGNOSIS — Z9181 History of falling: Secondary | ICD-10-CM

## 2019-01-19 DIAGNOSIS — H9190 Unspecified hearing loss, unspecified ear: Secondary | ICD-10-CM | POA: Diagnosis not present

## 2019-01-19 DIAGNOSIS — Z96642 Presence of left artificial hip joint: Secondary | ICD-10-CM | POA: Diagnosis not present

## 2019-01-19 DIAGNOSIS — Z09 Encounter for follow-up examination after completed treatment for conditions other than malignant neoplasm: Secondary | ICD-10-CM

## 2019-01-19 DIAGNOSIS — Z471 Aftercare following joint replacement surgery: Secondary | ICD-10-CM | POA: Diagnosis not present

## 2019-01-19 HISTORY — PX: TOTAL HIP ARTHROPLASTY: SHX124

## 2019-01-19 SURGERY — ARTHROPLASTY, HIP, TOTAL, ANTERIOR APPROACH
Anesthesia: Monitor Anesthesia Care | Laterality: Left

## 2019-01-19 MED ORDER — LACTATED RINGERS IV SOLN
INTRAVENOUS | Status: DC
  Administered 2019-01-19 (×2): via INTRAVENOUS

## 2019-01-19 MED ORDER — FENTANYL CITRATE (PF) 100 MCG/2ML IJ SOLN
INTRAMUSCULAR | Status: AC
  Filled 2019-01-19: qty 2

## 2019-01-19 MED ORDER — POLYETHYLENE GLYCOL 3350 17 G PO PACK: 17.0000 g | PACK | Freq: Every day | ORAL | Status: DC | PRN

## 2019-01-19 MED ORDER — ONDANSETRON HCL 4 MG/2ML IJ SOLN: 4.0000 mg | Freq: Four times a day (QID) | INTRAMUSCULAR | Status: DC | PRN

## 2019-01-19 MED ORDER — ALUM & MAG HYDROXIDE-SIMETH 200-200-20 MG/5ML PO SUSP: 30.0000 mL | ORAL | Status: DC | PRN

## 2019-01-19 MED ORDER — ASPIRIN 81 MG PO CHEW
81.0000 mg | CHEWABLE_TABLET | Freq: Two times a day (BID) | ORAL | Status: DC
  Administered 2019-01-19 – 2019-01-20 (×2): 81 mg via ORAL
  Filled 2019-01-19 (×2): qty 1

## 2019-01-19 MED ORDER — HYDROCODONE-ACETAMINOPHEN 7.5-325 MG PO TABS
1.0000 | ORAL_TABLET | ORAL | Status: DC | PRN
  Administered 2019-01-19 – 2019-01-20 (×3): 1 via ORAL
  Filled 2019-01-19 (×2): qty 1
  Filled 2019-01-19: qty 2

## 2019-01-19 MED ORDER — HYDROCODONE-ACETAMINOPHEN 5-325 MG PO TABS: 1.0000 | ORAL_TABLET | ORAL | Status: DC | PRN

## 2019-01-19 MED ORDER — BUPIVACAINE-EPINEPHRINE (PF) 0.25% -1:200000 IJ SOLN
INTRAMUSCULAR | Status: AC
  Filled 2019-01-19: qty 30

## 2019-01-19 MED ORDER — ISOPROPYL ALCOHOL 70 % SOLN
Status: DC | PRN
  Administered 2019-01-19: 1 via TOPICAL

## 2019-01-19 MED ORDER — TRANEXAMIC ACID-NACL 1000-0.7 MG/100ML-% IV SOLN
INTRAVENOUS | Status: AC
  Filled 2019-01-19: qty 100

## 2019-01-19 MED ORDER — PHENOL 1.4 % MT LIQD
1.0000 | OROMUCOSAL | Status: DC | PRN
  Filled 2019-01-19: qty 177

## 2019-01-19 MED ORDER — SENNA 8.6 MG PO TABS
1.0000 | ORAL_TABLET | Freq: Two times a day (BID) | ORAL | Status: DC
  Administered 2019-01-19 – 2019-01-20 (×2): 8.6 mg via ORAL
  Filled 2019-01-19 (×2): qty 1

## 2019-01-19 MED ORDER — MENTHOL 3 MG MT LOZG: 1.0000 | LOZENGE | OROMUCOSAL | Status: DC | PRN

## 2019-01-19 MED ORDER — METHOCARBAMOL 500 MG PO TABS
500.0000 mg | ORAL_TABLET | Freq: Four times a day (QID) | ORAL | Status: DC | PRN
  Administered 2019-01-19 – 2019-01-20 (×2): 500 mg via ORAL
  Filled 2019-01-19 (×2): qty 1

## 2019-01-19 MED ORDER — DIPHENHYDRAMINE HCL 12.5 MG/5ML PO ELIX: 12.5000 mg | ORAL_SOLUTION | ORAL | Status: DC | PRN

## 2019-01-19 MED ORDER — FENTANYL CITRATE (PF) 100 MCG/2ML IJ SOLN
INTRAMUSCULAR | Status: DC | PRN
  Administered 2019-01-19: 100 ug via INTRAVENOUS

## 2019-01-19 MED ORDER — CEFAZOLIN SODIUM-DEXTROSE 2-4 GM/100ML-% IV SOLN
INTRAVENOUS | Status: AC
  Filled 2019-01-19: qty 100

## 2019-01-19 MED ORDER — BUPIVACAINE-EPINEPHRINE 0.25% -1:200000 IJ SOLN
INTRAMUSCULAR | Status: DC | PRN
  Administered 2019-01-19: 30 mL

## 2019-01-19 MED ORDER — OXYCODONE HCL 5 MG PO TABS: 5.0000 mg | ORAL_TABLET | Freq: Once | ORAL | Status: DC | PRN

## 2019-01-19 MED ORDER — MIDAZOLAM HCL 5 MG/5ML IJ SOLN
INTRAMUSCULAR | Status: DC | PRN
  Administered 2019-01-19: 2 mg via INTRAVENOUS

## 2019-01-19 MED ORDER — ISOPROPYL ALCOHOL 70 % SOLN
Status: AC
  Filled 2019-01-19: qty 480

## 2019-01-19 MED ORDER — BUPIVACAINE HCL (PF) 0.75 % IJ SOLN
INTRAMUSCULAR | Status: DC | PRN
  Administered 2019-01-19: 2 mL via INTRATHECAL

## 2019-01-19 MED ORDER — WATER FOR IRRIGATION, STERILE IR SOLN
Status: DC | PRN
  Administered 2019-01-19 (×2): 1000 mL

## 2019-01-19 MED ORDER — SUGAMMADEX SODIUM 200 MG/2ML IV SOLN
INTRAVENOUS | Status: AC
  Filled 2019-01-19: qty 2

## 2019-01-19 MED ORDER — OXYCODONE HCL 5 MG/5ML PO SOLN: 5.0000 mg | Freq: Once | ORAL | Status: DC | PRN

## 2019-01-19 MED ORDER — DOCUSATE SODIUM 100 MG PO CAPS
100.0000 mg | ORAL_CAPSULE | Freq: Two times a day (BID) | ORAL | Status: DC
  Administered 2019-01-19 – 2019-01-20 (×2): 100 mg via ORAL
  Filled 2019-01-19 (×2): qty 1

## 2019-01-19 MED ORDER — CEFAZOLIN SODIUM-DEXTROSE 2-3 GM-%(50ML) IV SOLR
INTRAVENOUS | Status: DC | PRN
  Administered 2019-01-19: 2 g via INTRAVENOUS

## 2019-01-19 MED ORDER — ACETAMINOPHEN 10 MG/ML IV SOLN
INTRAVENOUS | Status: AC
  Filled 2019-01-19: qty 100

## 2019-01-19 MED ORDER — KETOROLAC TROMETHAMINE 30 MG/ML IJ SOLN
INTRAMUSCULAR | Status: AC
  Filled 2019-01-19: qty 1

## 2019-01-19 MED ORDER — PROPOFOL 10 MG/ML IV BOLUS
INTRAVENOUS | Status: DC | PRN
  Administered 2019-01-19 (×2): 20 mg via INTRAVENOUS

## 2019-01-19 MED ORDER — ACETAMINOPHEN 500 MG PO TABS: 1000.0000 mg | ORAL_TABLET | Freq: Once | ORAL | Status: DC | PRN

## 2019-01-19 MED ORDER — SODIUM CHLORIDE 0.9 % IR SOLN
Status: DC | PRN
  Administered 2019-01-19: 3000 mL
  Administered 2019-01-19: 1000 mL

## 2019-01-19 MED ORDER — ACETAMINOPHEN 160 MG/5ML PO SOLN: 1000.0000 mg | Freq: Once | ORAL | Status: DC | PRN

## 2019-01-19 MED ORDER — MIDAZOLAM HCL 2 MG/2ML IJ SOLN
INTRAMUSCULAR | Status: AC
  Filled 2019-01-19: qty 2

## 2019-01-19 MED ORDER — FENTANYL CITRATE (PF) 100 MCG/2ML IJ SOLN: 25.0000 ug | INTRAMUSCULAR | Status: DC | PRN

## 2019-01-19 MED ORDER — EPHEDRINE 5 MG/ML INJ
INTRAVENOUS | Status: AC
  Filled 2019-01-19: qty 10

## 2019-01-19 MED ORDER — ACETAMINOPHEN 10 MG/ML IV SOLN: 1000.0000 mg | Freq: Once | INTRAVENOUS | Status: DC | PRN

## 2019-01-19 MED ORDER — SODIUM CHLORIDE 0.9 % IV SOLN
INTRAVENOUS | Status: DC
  Administered 2019-01-19 – 2019-01-20 (×3): via INTRAVENOUS

## 2019-01-19 MED ORDER — CEFAZOLIN SODIUM-DEXTROSE 2-4 GM/100ML-% IV SOLN
2.0000 g | Freq: Four times a day (QID) | INTRAVENOUS | Status: AC
  Administered 2019-01-19 (×2): 2 g via INTRAVENOUS
  Filled 2019-01-19 (×2): qty 100

## 2019-01-19 MED ORDER — METOCLOPRAMIDE HCL 5 MG/ML IJ SOLN: 5.0000 mg | Freq: Three times a day (TID) | INTRAMUSCULAR | Status: DC | PRN

## 2019-01-19 MED ORDER — METOCLOPRAMIDE HCL 5 MG PO TABS: 5.0000 mg | ORAL_TABLET | Freq: Three times a day (TID) | ORAL | Status: DC | PRN

## 2019-01-19 MED ORDER — PROPOFOL 10 MG/ML IV BOLUS
INTRAVENOUS | Status: AC
  Filled 2019-01-19: qty 20

## 2019-01-19 MED ORDER — ONDANSETRON HCL 4 MG PO TABS
4.0000 mg | ORAL_TABLET | Freq: Four times a day (QID) | ORAL | Status: DC | PRN
  Filled 2019-01-19: qty 1

## 2019-01-19 MED ORDER — TRANEXAMIC ACID-NACL 1000-0.7 MG/100ML-% IV SOLN
INTRAVENOUS | Status: DC | PRN
  Administered 2019-01-19: 1000 mg via INTRAVENOUS

## 2019-01-19 MED ORDER — KETOROLAC TROMETHAMINE 30 MG/ML IJ SOLN
INTRAMUSCULAR | Status: DC | PRN
  Administered 2019-01-19: 30 mg via INTRAVENOUS

## 2019-01-19 MED ORDER — ACETAMINOPHEN 325 MG PO TABS: 325.0000 mg | ORAL_TABLET | Freq: Four times a day (QID) | ORAL | Status: DC | PRN

## 2019-01-19 MED ORDER — DEXAMETHASONE SODIUM PHOSPHATE 10 MG/ML IJ SOLN
10.0000 mg | Freq: Once | INTRAMUSCULAR | Status: AC
  Administered 2019-01-20: 10 mg via INTRAVENOUS
  Filled 2019-01-19: qty 1

## 2019-01-19 MED ORDER — SODIUM CHLORIDE (PF) 0.9 % IJ SOLN
INTRAMUSCULAR | Status: DC | PRN
  Administered 2019-01-19: 30 mL via INTRAVENOUS

## 2019-01-19 MED ORDER — MORPHINE SULFATE (PF) 4 MG/ML IV SOLN: 0.5000 mg | INTRAVENOUS | Status: DC | PRN

## 2019-01-19 MED ORDER — SODIUM CHLORIDE (PF) 0.9 % IJ SOLN
INTRAMUSCULAR | Status: AC
  Filled 2019-01-19: qty 50

## 2019-01-19 MED ORDER — KETOROLAC TROMETHAMINE 15 MG/ML IJ SOLN
7.5000 mg | Freq: Four times a day (QID) | INTRAMUSCULAR | Status: DC
  Administered 2019-01-19 – 2019-01-20 (×3): 7.5 mg via INTRAVENOUS
  Filled 2019-01-19 (×2): qty 1

## 2019-01-19 MED ORDER — PROPOFOL 500 MG/50ML IV EMUL
INTRAVENOUS | Status: DC | PRN
  Administered 2019-01-19: 100 ug/kg/min via INTRAVENOUS

## 2019-01-19 MED ORDER — VITAMIN C 500 MG PO TABS
2000.0000 mg | ORAL_TABLET | Freq: Every day | ORAL | Status: DC
  Administered 2019-01-20: 2000 mg via ORAL
  Filled 2019-01-19: qty 4

## 2019-01-19 MED ORDER — ACETAMINOPHEN 10 MG/ML IV SOLN
INTRAVENOUS | Status: DC | PRN
  Administered 2019-01-19: 1000 mg via INTRAVENOUS

## 2019-01-19 MED ORDER — PROPOFOL 10 MG/ML IV BOLUS
INTRAVENOUS | Status: AC
  Filled 2019-01-19: qty 40

## 2019-01-19 MED ORDER — METHOCARBAMOL 500 MG IVPB - SIMPLE MED
500.0000 mg | Freq: Four times a day (QID) | INTRAVENOUS | Status: DC | PRN
  Filled 2019-01-19: qty 50

## 2019-01-19 SURGICAL SUPPLY — 60 items
ADH SKN CLS APL DERMABOND .7 (GAUZE/BANDAGES/DRESSINGS) ×2
BAG DECANTER FOR FLEXI CONT (MISCELLANEOUS) IMPLANT
BAG SPEC THK2 15X12 ZIP CLS (MISCELLANEOUS)
BAG ZIPLOCK 12X15 (MISCELLANEOUS) IMPLANT
BLADE SURG SZ10 CARB STEEL (BLADE) ×4 IMPLANT
CHLORAPREP W/TINT 26 (MISCELLANEOUS) ×2 IMPLANT
CLOTH BEACON ORANGE TIMEOUT ST (SAFETY) ×2 IMPLANT
COVER PERINEAL POST (MISCELLANEOUS) ×2 IMPLANT
COVER SURGICAL LIGHT HANDLE (MISCELLANEOUS) ×2 IMPLANT
COVER WAND RF STERILE (DRAPES) IMPLANT
CUP SECTOR GRIPTON 58MM (Orthopedic Implant) ×2 IMPLANT
DECANTER SPIKE VIAL GLASS SM (MISCELLANEOUS) ×2 IMPLANT
DERMABOND ADVANCED (GAUZE/BANDAGES/DRESSINGS) ×2
DERMABOND ADVANCED .7 DNX12 (GAUZE/BANDAGES/DRESSINGS) ×2 IMPLANT
DRAPE IMP U-DRAPE 54X76 (DRAPES) ×8 IMPLANT
DRAPE SHEET LG 3/4 BI-LAMINATE (DRAPES) ×10 IMPLANT
DRAPE STERI IOBAN 125X83 (DRAPES) ×2 IMPLANT
DRAPE U-SHAPE 47X51 STRL (DRAPES) ×4 IMPLANT
DRESSING AQUACEL AG SP 3.5X10 (GAUZE/BANDAGES/DRESSINGS) ×1 IMPLANT
DRSG AQUACEL AG ADV 3.5X10 (GAUZE/BANDAGES/DRESSINGS) ×2 IMPLANT
DRSG AQUACEL AG SP 3.5X10 (GAUZE/BANDAGES/DRESSINGS) ×2
ELECT BLADE TIP CTD 4 INCH (ELECTRODE) ×2 IMPLANT
ELECT PENCIL ROCKER SW 15FT (MISCELLANEOUS) ×2 IMPLANT
ELECT REM PT RETURN 15FT ADLT (MISCELLANEOUS) ×2 IMPLANT
GAUZE SPONGE 4X4 12PLY STRL (GAUZE/BANDAGES/DRESSINGS) ×2 IMPLANT
GLOVE BIO SURGEON STRL SZ8.5 (GLOVE) ×4 IMPLANT
GLOVE BIOGEL PI IND STRL 8.5 (GLOVE) ×1 IMPLANT
GLOVE BIOGEL PI INDICATOR 8.5 (GLOVE) ×1
GOWN SPEC L3 XXLG W/TWL (GOWN DISPOSABLE) ×2 IMPLANT
HANDPIECE INTERPULSE COAX TIP (DISPOSABLE) ×1
HEAD CERAMIC DELTA 36 PLUS 1.5 (Hips) ×2 IMPLANT
HOLDER FOLEY CATH W/STRAP (MISCELLANEOUS) ×2 IMPLANT
HOOD PEEL AWAY FLYTE STAYCOOL (MISCELLANEOUS) ×8 IMPLANT
KIT TURNOVER KIT A (KITS) IMPLANT
LINER NEUTRAL 52X36X58N (Liner) ×2 IMPLANT
MANIFOLD NEPTUNE II (INSTRUMENTS) ×2 IMPLANT
MARKER SKIN DUAL TIP RULER LAB (MISCELLANEOUS) ×2 IMPLANT
NDL SAFETY ECLIPSE 18X1.5 (NEEDLE) ×1 IMPLANT
NEEDLE HYPO 18GX1.5 SHARP (NEEDLE) ×1
NEEDLE SPNL 18GX3.5 QUINCKE PK (NEEDLE) ×2 IMPLANT
PACK ANTERIOR HIP CUSTOM (KITS) ×2 IMPLANT
SAW OSC TIP CART 19.5X105X1.3 (SAW) ×4 IMPLANT
SEALER BIPOLAR AQUA 6.0 (INSTRUMENTS) ×2 IMPLANT
SET HNDPC FAN SPRY TIP SCT (DISPOSABLE) ×1 IMPLANT
STEM TRI LOC BPS SZ8 W GRIPTON ×1 IMPLANT
SUT ETHIBOND NAB CT1 #1 30IN (SUTURE) ×4 IMPLANT
SUT MNCRL AB 3-0 PS2 18 (SUTURE) ×2 IMPLANT
SUT MNCRL AB 4-0 PS2 18 (SUTURE) ×2 IMPLANT
SUT MON AB 2-0 CT1 36 (SUTURE) ×4 IMPLANT
SUT STRATAFIX PDO 1 14 VIOLET (SUTURE) ×2
SUT STRATFX PDO 1 14 VIOLET (SUTURE) ×1
SUT VIC AB 2-0 CT1 27 (SUTURE) ×1
SUT VIC AB 2-0 CT1 TAPERPNT 27 (SUTURE) ×1 IMPLANT
SUTURE STRATFX PDO 1 14 VIOLET (SUTURE) ×1 IMPLANT
SYR 3ML LL SCALE MARK (SYRINGE) ×4 IMPLANT
SYR 50ML LL SCALE MARK (SYRINGE) ×2 IMPLANT
TRAY FOLEY MTR SLVR 16FR STAT (SET/KITS/TRAYS/PACK) IMPLANT
TRI LOC BPS SZ8 W GRIPTON ×2 IMPLANT
WATER STERILE IRR 1000ML POUR (IV SOLUTION) ×2 IMPLANT
YANKAUER SUCT BULB TIP 10FT TU (MISCELLANEOUS) ×2 IMPLANT

## 2019-01-19 NOTE — Evaluation (Signed)
Physical Therapy Evaluation Patient Details Name: Ronald Franklin MRN: [id number] DOB: [birth year] Today's Date: 01/19/2019   History of Present Illness  68 yo male s/p L DA-THA on 01/19/19. PMH includes HTN, OA, OSA, cervical laminectomy, bilateral RTC repair.   Clinical Impression  Pt presents with L hip pain, increased time and effort to perform mobility tasks, and decreased activity tolerance due to L hip pain. Pt to benefit from acute PT to address deficits. Pt ambulated 100 ft with RW with min guard assist, verbal cuing for form and safety provided throughout. Pt educated on ankle pumps (20/hour) to perform this afternoon/evening to increase circulation, to pt's tolerance and limited by pain. PT to progress mobility as tolerated, and will continue to follow acutely.        Follow Up Recommendations Follow surgeon's recommendation for DC plan and follow-up therapies;Supervision for mobility/OOB(HEP)    Equipment Recommendations  Rolling walker with 5" wheels;3in1 (PT)    Recommendations for Other Services       Precautions / Restrictions Precautions Precautions: Fall Restrictions Weight Bearing Restrictions: No Other Position/Activity Restrictions: WBAT      Mobility  Bed Mobility Overal bed mobility: Needs Assistance Bed Mobility: Supine to Sit     Supine to sit: Min guard;HOB elevated     General bed mobility comments: Min guard for safety, increased time and effort to perform .  Transfers Overall transfer level: Needs assistance Equipment used: Rolling walker (2 wheeled) Transfers: Sit to/from Stand Sit to Stand: Min guard;From elevated surface         General transfer comment: Min guard for safety, verbal cuing for hand placement.   Ambulation/Gait Ambulation/Gait assistance: Min guard Gait Distance (Feet): 100 Feet Assistive device: Rolling walker (2 wheeled) Gait Pattern/deviations: Step-through pattern;Decreased stride length;Trunk flexed Gait velocity:  decr    General Gait Details: Min guard for safety, verbal cuing for sequencing, upright posture.  Stairs            Wheelchair Mobility    Modified Rankin (Stroke Patients Only)       Balance Overall balance assessment: Mild deficits observed, not formally tested                                           Pertinent Vitals/Pain Pain Assessment: 0-10 Pain Score: 6  Pain Location: L hip  Pain Descriptors / Indicators: Sore Pain Intervention(s): Monitored during session;Repositioned;Limited activity within patient's tolerance;Premedicated before session    Home Living Family/patient expects to be discharged to:: Private residence Living Arrangements: Spouse/significant other Available Help at Discharge: Family Type of Home: House Home Access: Stairs to enter Entrance Stairs-Rails: Psychiatric nurse of Steps: 3 Home Layout: Two level;Able to live on main level with bedroom/bathroom Home Equipment: Shower seat - built in      Prior Function Level of Independence: Independent               Hand Dominance   Dominant Hand: Right    Extremity/Trunk Assessment   Upper Extremity Assessment Upper Extremity Assessment: Overall WFL for tasks assessed    Lower Extremity Assessment Lower Extremity Assessment: Overall WFL for tasks assessed;LLE deficits/detail LLE Deficits / Details: suspected post-operative hip weakness    Cervical / Trunk Assessment Cervical / Trunk Assessment: Normal  Communication   Communication: No difficulties  Cognition Arousal/Alertness: Awake/alert Behavior During Therapy: WFL for tasks assessed/performed Overall Cognitive Status:  Within Functional Limits for tasks assessed                                        General Comments      Exercises     Assessment/Plan    PT Assessment Patient needs continued PT services  PT Problem List Decreased strength;Decreased  mobility;Decreased range of motion;Decreased balance;Decreased activity tolerance;Decreased knowledge of use of DME;Pain       PT Treatment Interventions DME instruction;Functional mobility training;Patient/family education;Balance training;Gait training;Therapeutic activities;Stair training;Therapeutic exercise    PT Goals (Current goals can be found in the Care Plan section)  Acute Rehab PT Goals Patient Stated Goal: go home ASAP PT Goal Formulation: With patient Time For Goal Achievement: 01/26/19 Potential to Achieve Goals: Good    Frequency 7X/week   Barriers to discharge        Co-evaluation               AM-PAC PT "6 Clicks" Mobility  Outcome Measure Help needed turning from your back to your side while in a flat bed without using bedrails?: A Little Help needed moving from lying on your back to sitting on the side of a flat bed without using bedrails?: A Little Help needed moving to and from a bed to a chair (including a wheelchair)?: A Little Help needed standing up from a chair using your arms (e.g., wheelchair or bedside chair)?: A Little Help needed to walk in hospital room?: A Little Help needed climbing 3-5 steps with a railing? : A Little 6 Click Score: 18    End of Session Equipment Utilized During Treatment: Gait belt Activity Tolerance: Patient tolerated treatment well Patient left: in chair;with call bell/phone within reach(on SCD break for shift change) Nurse Communication: Mobility status PT Visit Diagnosis: Other abnormalities of gait and mobility (R26.89);Difficulty in walking, not elsewhere classified (R26.2)    Time: 1850-1916 PT Time Calculation (min) (ACUTE ONLY): 26 min   Charges:   PT Evaluation $PT Eval Low Complexity: 1 Low PT Treatments $Gait Training: 8-22 mins        Julien Girt, PT Acute Rehabilitation Services Pager [phone]  Office [phone]   Roxine Caddy D Elonda Husky 01/19/2019, 7:23 PM

## 2019-01-19 NOTE — Anesthesia Procedure Notes (Signed)
Spinal  Patient location during procedure: OR Start time: 01/19/2019 12:17 PM End time: 01/19/2019 12:21 PM Staffing Resident/CRNA: Claudia Desanctis, CRNA Performed: resident/CRNA  Preanesthetic Checklist Completed: patient identified, site marked, surgical consent, pre-op evaluation, timeout performed, IV checked, risks and benefits discussed and monitors and equipment checked Spinal Block Patient position: sitting Prep: DuraPrep Patient monitoring: heart rate, cardiac monitor, continuous pulse ox and blood pressure Approach: midline Location: L4-5 Injection technique: single-shot Needle Needle type: Pencan  Needle gauge: 24 G Needle length: 10 cm Needle insertion depth: 9 cm Assessment Sensory level: T4

## 2019-01-19 NOTE — Transfer of Care (Signed)
Immediate Anesthesia Transfer of Care Note  Patient: Ronald Franklin  Procedure(s) Performed: TOTAL HIP ARTHROPLASTY ANTERIOR APPROACH (Left )  Patient Location: PACU  Anesthesia Type:Spinal  Level of Consciousness: awake and patient cooperative  Airway & Oxygen Therapy: Patient Spontanous Breathing and Patient connected to face mask  Post-op Assessment: Report given to RN and Post -op Vital signs reviewed and stable  Post vital signs: Reviewed and stable  Last Vitals:  Vitals Value Taken Time  BP    Temp    Pulse    Resp    SpO2      Last Pain:  Vitals:   01/19/19 1044  TempSrc: Oral  PainSc:          Complications: No apparent anesthesia complications

## 2019-01-19 NOTE — Anesthesia Preprocedure Evaluation (Addendum)
Anesthesia Evaluation  Patient identified by MRN, date of birth, ID band Patient awake    Reviewed: Allergy & Precautions, NPO status , Patient's Chart, lab work & pertinent test results  History of Anesthesia Complications Negative for: history of anesthetic complications  Airway Mallampati: IV  TM Distance: >3 FB Neck ROM: Full    Dental  (+) Dental Advisory Given   Pulmonary neg shortness of breath, sleep apnea and Continuous Positive Airway Pressure Ventilation , neg COPD, neg recent URI,    breath sounds clear to auscultation       Cardiovascular hypertension, (-) angina(-) Past MI and (-) CHF  Rhythm:Regular     Neuro/Psych neg Seizures  Neuromuscular disease negative psych ROS   GI/Hepatic negative GI ROS, Neg liver ROS,   Endo/Other  negative endocrine ROS  Renal/GU negative Renal ROS     Musculoskeletal  (+) Arthritis ,   Abdominal   Peds  Hematology negative hematology ROS (+)   Anesthesia Other Findings   Reproductive/Obstetrics                            Anesthesia Physical Anesthesia Plan  ASA: II  Anesthesia Plan: MAC and Spinal   Post-op Pain Management:    Induction:   PONV Risk Score and Plan: 1 and Treatment may vary due to age or medical condition and Propofol infusion  Airway Management Planned: Nasal Cannula  Additional Equipment: None  Intra-op Plan:   Post-operative Plan:   Informed Consent: I have reviewed the patients History and Physical, chart, labs and discussed the procedure including the risks, benefits and alternatives for the proposed anesthesia with the patient or authorized representative who has indicated his/her understanding and acceptance.     Dental advisory given  Plan Discussed with: CRNA and Surgeon  Anesthesia Plan Comments:         Anesthesia Quick Evaluation

## 2019-01-19 NOTE — Anesthesia Procedure Notes (Signed)
Procedure Name: MAC Date/Time: 01/19/2019 12:20 PM Performed by: Claudia Desanctis, CRNA Pre-anesthesia Checklist: Patient identified, Emergency Drugs available, Suction available and Patient being monitored Patient Re-evaluated:Patient Re-evaluated prior to induction Oxygen Delivery Method: Simple face mask

## 2019-01-19 NOTE — Interval H&P Note (Signed)
History and Physical Interval Note:  01/19/2019 11:09 AM  Ronald Franklin  has presented today for surgery, with the diagnosis of Degenerative Joint disease left hip.  The various methods of treatment have been discussed with the patient and family. After consideration of risks, benefits and other options for treatment, the patient has consented to  Procedure(s): TOTAL HIP ARTHROPLASTY ANTERIOR APPROACH (Left) as a surgical intervention.  The patient's history has been reviewed, patient examined, no change in status, stable for surgery.  I have reviewed the patient's chart and labs.  Questions were answered to the patient's satisfaction.     Hilton Cork Sharnika Binney

## 2019-01-19 NOTE — Discharge Instructions (Signed)
°Dr. Deavin Forst °Joint Replacement Specialist °[facility] °[address] °[place], [place] [address] °[phone] ° ° °TOTAL HIP REPLACEMENT POSTOPERATIVE DIRECTIONS ° ° ° °Hip Rehabilitation, Guidelines Following Surgery  ° °WEIGHT BEARING °Weight bearing as tolerated with assist device (walker, cane, etc) as directed, use it as long as suggested by your surgeon or therapist, typically at least 4-6 weeks. ° °The results of a hip operation are greatly improved after range of motion and muscle strengthening exercises. Follow all safety measures which are given to protect your hip. If any of these exercises cause increased pain or swelling in your joint, decrease the amount until you are comfortable again. Then slowly increase the exercises. Call your caregiver if you have problems or questions.  ° °HOME CARE INSTRUCTIONS  °Most of the following instructions are designed to prevent the dislocation of your new hip.  °Remove items at home which could result in a fall. This includes throw rugs or furniture in walking pathways.  °Continue medications as instructed at time of discharge. °· You may have some home medications which will be placed on hold until you complete the course of blood thinner medication. °· You may start showering once you are discharged home. Do not remove your dressing. °Do not put on socks or shoes without following the instructions of your caregivers.   °Sit on chairs with arms. Use the chair arms to help push yourself up when arising.  °Arrange for the use of a toilet seat elevator so you are not sitting low.  °· Walk with walker as instructed.  °You may resume a sexual relationship in one month or when given the OK by your caregiver.  °Use walker as long as suggested by your caregivers.  °You may put full weight on your legs and walk as much as is comfortable. °Avoid periods of inactivity such as sitting longer than an hour when not asleep. This helps prevent  blood clots.  °You may return to work once you are cleared by your surgeon.  °Do not drive a car for 6 weeks or until released by your surgeon.  °Do not drive while taking narcotics.  °Wear elastic stockings for two weeks following surgery during the day but you may remove then at night.  °Make sure you keep all of your appointments after your operation with all of your doctors and caregivers. You should call the office at the above phone number and make an appointment for approximately two weeks after the date of your surgery. °Please pick up a stool softener and laxative for home use as long as you are requiring pain medications. °· ICE to the affected hip every three hours for 30 minutes at a time and then as needed for pain and swelling. Continue to use ice on the hip for pain and swelling from surgery. You may notice swelling that will progress down to the foot and ankle.  This is normal after surgery.  Elevate the leg when you are not up walking on it.   °It is important for you to complete the blood thinner medication as prescribed by your doctor. °· Continue to use the breathing machine which will help keep your temperature down.  It is common for your temperature to cycle up and down following surgery, especially at night when you are not up moving around and exerting yourself.  The breathing machine keeps your lungs expanded and your temperature down. ° °RANGE OF MOTION AND STRENGTHENING EXERCISES  °These exercises are   designed to help you keep full movement of your hip joint. Follow your caregiver's or physical therapist's instructions. Perform all exercises about fifteen times, three times per day or as directed. Exercise both hips, even if you have had only one joint replacement. These exercises can be done on a training (exercise) mat, on the floor, on a table or on a bed. Use whatever works the best and is most comfortable for you. Use music or television while you are exercising so that the exercises  are a pleasant break in your day. This will make your life better with the exercises acting as a break in routine you can look forward to.  °Lying on your back, slowly slide your foot toward your buttocks, raising your knee up off the floor. Then slowly slide your foot back down until your leg is straight again.  °Lying on your back spread your legs as far apart as you can without causing discomfort.  °Lying on your side, raise your upper leg and foot straight up from the floor as far as is comfortable. Slowly lower the leg and repeat.  °Lying on your back, tighten up the muscle in the front of your thigh (quadriceps muscles). You can do this by keeping your leg straight and trying to raise your heel off the floor. This helps strengthen the largest muscle supporting your knee.  °Lying on your back, tighten up the muscles of your buttocks both with the legs straight and with the knee bent at a comfortable angle while keeping your heel on the floor.  ° °SKILLED REHAB INSTRUCTIONS: °If the patient is transferred to a skilled rehab facility following release from the hospital, a list of the current medications will be sent to the facility for the patient to continue.  When discharged from the skilled rehab facility, please have the facility set up the patient's Home Health Physical Therapy prior to being released. Also, the skilled facility will be responsible for providing the patient with their medications at time of release from the facility to include their pain medication and their blood thinner medication. If the patient is still at the rehab facility at time of the two week follow up appointment, the skilled rehab facility will also need to assist the patient in arranging follow up appointment in our office and any transportation needs. ° °MAKE SURE YOU:  °Understand these instructions.  °Will watch your condition.  °Will get help right away if you are not doing well or get worse. ° °Pick up stool softner and  laxative for home use following surgery while on pain medications. °Do not remove your dressing. °The dressing is waterproof--it is OK to take showers. °Continue to use ice for pain and swelling after surgery. °Do not use any lotions or creams on the incision until instructed by your surgeon. °Total Hip Protocol. ° ° °

## 2019-01-19 NOTE — Op Note (Signed)
OPERATIVE REPORT  SURGEON: Rod Can, MD   ASSISTANT: Staff.  PREOPERATIVE DIAGNOSIS: Left hip arthritis.   POSTOPERATIVE DIAGNOSIS: Left hip arthritis.   PROCEDURE: Left total hip arthroplasty, anterior approach.   IMPLANTS: DePuy Tri Lock stem, size 8, hi offset. DePuy Pinnacle Cup, size 58 mm. DePuy Altrx liner, size 36 by 58 mm, neutral. DePuy Biolox ceramic head ball, size 36 + 1.5 mm.  ANESTHESIA:  Spinal  ESTIMATED BLOOD LOSS:-200 mL    ANTIBIOTICS: 2 g Ancef.  DRAINS: None.  COMPLICATIONS: None.   CONDITION: PACU - hemodynamically stable.   BRIEF CLINICAL NOTE: Ronald Franklin is a 68 y.o. male with a long-standing history of Left hip arthritis. After failing conservative management, the patient was indicated for total hip arthroplasty. The risks, benefits, and alternatives to the procedure were explained, and the patient elected to proceed.  PROCEDURE IN DETAIL: Surgical site was marked by myself in the pre-op holding area. Once inside the operating room, spinal anesthesia was obtained, and a foley catheter was inserted. The patient was then positioned on the Hana table. All bony prominences were well padded. The hip was prepped and draped in the normal sterile surgical fashion. A time-out was called verifying side and site of surgery. The patient received IV antibiotics within 60 minutes of beginning the procedure.  The direct anterior approach to the hip was performed through the Hueter interval. Lateral femoral circumflex vessels were treated with the Auqumantys. The anterior capsule was exposed and an inverted T capsulotomy was made.The femoral neck cut was made to the level of the templated cut. A corkscrew was placed into the head and the head was removed. The femoral head was found to have eburnated bone. The head was passed to the back table and was measured.  Acetabular exposure was achieved, and the pulvinar and labrum were excised.  Sequential reaming of the acetabulum was then performed up to a size 57 mm reamer. A 58 mm cup was then opened and impacted into place at approximately 40 degrees of abduction and 20 degrees of anteversion. The final polyethylene liner was impacted into place and acetabular osteophytes were removed.   I then gained femoral exposure taking care to protect the abductors and greater trochanter. This was performed using standard external rotation, extension, and adduction. The capsule was peeled off the inner aspect of the greater trochanter, taking care to preserve the short external rotators. A cookie cutter was used to enter the femoral canal, and then the femoral canal finder was placed. Sequential broaching was performed up to a size 8. Calcar planer was used on the femoral neck remnant. I placed a hi offset neck and a trial head ball. The hip was reduced. Leg lengths and offset were checked fluoroscopically. The hip was dislocated and trial components were removed. The final implants were placed, and the hip was reduced.  Fluoroscopy was used to confirm component position and leg lengths. At 90 degrees of external rotation and full extension, the hip was stable to an anterior directed force.  The wound was copiously irrigated with normal saline using pulse lavage. Marcaine solution was injected into the periarticular soft tissue. The wound was closed in layers using #1 Vicryl and V-Loc for the fascia, 2-0 Vicryl for the subcutaneous fat, 2-0 Monocryl for the deep dermal layer, 3-0 running Monocryl subcuticular stitch, and Dermabond for the skin. Once the glue was fully dried, an Aquacell Ag dressing was applied. The patient was transported to the recovery room in stable  condition. Sponge, needle, and instrument counts were correct at the end of the case x2. The patient tolerated the procedure well and there were no known complications.

## 2019-01-20 ENCOUNTER — Encounter (HOSPITAL_COMMUNITY): Payer: Self-pay | Admitting: Orthopedic Surgery

## 2019-01-20 LAB — BASIC METABOLIC PANEL WITH GFR
Anion gap: 4 — ABNORMAL LOW (ref 5–15)
BUN: 17 mg/dL (ref 8–23)
CO2: 24 mmol/L (ref 22–32)
Calcium: 8.5 mg/dL — ABNORMAL LOW (ref 8.9–10.3)
Chloride: 109 mmol/L (ref 98–111)
Creatinine, Ser: 0.81 mg/dL (ref 0.61–1.24)
GFR calc Af Amer: >60 mL/min
GFR calc non Af Amer: >60 mL/min
Glucose, Bld: 127 mg/dL — ABNORMAL HIGH (ref 70–99)
Potassium: 4.1 mmol/L (ref 3.5–5.1)
Sodium: 137 mmol/L (ref 135–145)

## 2019-01-20 LAB — CBC
HCT: 38 % — ABNORMAL LOW (ref 39.0–52.0)
Hemoglobin: 12.1 g/dL — ABNORMAL LOW (ref 13.0–17.0)
MCH: 30.9 pg (ref 26.0–34.0)
MCHC: 31.8 g/dL (ref 30.0–36.0)
MCV: 96.9 fL (ref 80.0–100.0)
Platelets: 153 K/uL (ref 150–400)
RBC: 3.92 MIL/uL — ABNORMAL LOW (ref 4.22–5.81)
RDW: 12.9 % (ref 11.5–15.5)
WBC: 8.4 K/uL (ref 4.0–10.5)
nRBC: 0 % (ref 0.0–0.2)

## 2019-01-20 MED ORDER — SENNA 8.6 MG PO TABS: 1.0000 | ORAL_TABLET | Freq: Two times a day (BID) | ORAL | 0 refills | Status: DC

## 2019-01-20 MED ORDER — ONDANSETRON HCL 4 MG PO TABS: 4.0000 mg | ORAL_TABLET | Freq: Four times a day (QID) | ORAL | 0 refills | Status: DC | PRN

## 2019-01-20 MED ORDER — ASPIRIN 81 MG PO CHEW: 81.0000 mg | CHEWABLE_TABLET | Freq: Two times a day (BID) | ORAL | 1 refills | Status: DC

## 2019-01-20 MED ORDER — DOCUSATE SODIUM 100 MG PO CAPS: 100.0000 mg | ORAL_CAPSULE | Freq: Two times a day (BID) | ORAL | 1 refills | Status: DC

## 2019-01-20 MED ORDER — HYDROCODONE-ACETAMINOPHEN 5-325 MG PO TABS: 1.0000 | ORAL_TABLET | ORAL | 0 refills | Status: DC | PRN

## 2019-01-20 NOTE — Progress Notes (Signed)
    Subjective:  Patient reports pain as mild.  Denies N/V/CP/SOB. Wants to go home.  Objective:   VITALS:   Vitals:   01/19/19 1824 01/19/19 1934 01/20/19 0110 01/20/19 0455  BP: (!) 145/86 131/82 119/74 132/81  Pulse: 60 (!) 58 (!) 58 (!) 53  Resp: 16 18 18 18   Temp: 98.3 F (36.8 C) 97.6 F (36.4 C) 97.6 F (36.4 C) 98.1 F (36.7 C)  TempSrc: Oral Oral Oral   SpO2: 100% 100% 98% 100%  Weight:      Height:        NAD ABD soft Sensation intact distally Intact pulses distally Dorsiflexion/Plantar flexion intact Incision: dressing C/D/I Compartment soft   Lab Results  Component Value Date   WBC 8.4 01/20/2019   HGB 12.1 (L) 01/20/2019   HCT 38.0 (L) 01/20/2019   MCV 96.9 01/20/2019   PLT 153 01/20/2019   BMET    Component Value Date/Time   NA 137 01/20/2019 0347   K 4.1 01/20/2019 0347   CL 109 01/20/2019 0347   CO2 24 01/20/2019 0347   GLUCOSE 127 (H) 01/20/2019 0347   BUN 17 01/20/2019 0347   CREATININE 0.81 01/20/2019 0347   CALCIUM 8.5 (L) 01/20/2019 0347   GFRNONAA >60 01/20/2019 0347   GFRAA >60 01/20/2019 0347     Assessment/Plan: 1 Day Post-Op   Active Problems:   Osteoarthritis of left hip   WBAT with walker DVT ppx: Aspirin, SCDs, TEDS PO pain control PT/OT Dispo: D/C home with HEP   Hilton Cork Arabelle Bollig 01/20/2019, 9:07 AM   Rod Can, MD Cell: [phone] [facility]  [place] [address], [place], [place], [place] [address] Phone: [phone] [email or web address] Facebook  [employer]

## 2019-01-20 NOTE — Progress Notes (Signed)
Reviewed discharge instructions and medications. Patient verbalizes understanding and has no further instructions. Patient eating lunch and waiting on wife to pick up. Will continue to monitor.

## 2019-01-20 NOTE — Progress Notes (Signed)
Physical Therapy Treatment Patient Details Name: Ronald Franklin MRN: [id number] DOB: September 23, 1950 Today's Date: 01/20/2019    History of Present Illness 68 yo male s/p L DA-THA on 01/19/19. PMH includes HTN, OA, OSA, cervical laminectomy, bilateral RTC repair.     PT Comments    Pt has met PT goals and is ready to DC home from PT standpoint. He amubulated 350' with RW, completed stair training, and demonstrates good understanding of HEP.   Follow Up Recommendations  Follow surgeon's recommendation for DC plan and follow-up therapies;Supervision for mobility/OOB(HEP)     Equipment Recommendations  Rolling walker with 5" wheels;3in1 (PT)    Recommendations for Other Services       Precautions / Restrictions Precautions Precautions: Fall Restrictions Weight Bearing Restrictions: No Other Position/Activity Restrictions: WBAT    Mobility  Bed Mobility Overal bed mobility: Modified Independent Bed Mobility: Supine to Sit     Supine to sit: HOB elevated;Modified independent (Device/Increase time)     General bed mobility comments:  increased time and effort to perform .  Transfers Overall transfer level: Needs assistance Equipment used: Rolling walker (2 wheeled) Transfers: Sit to/from Stand Sit to Stand: From elevated surface;Supervision         General transfer comment:  verbal cuing for hand placement.   Ambulation/Gait Ambulation/Gait assistance: Modified independent (Device/Increase time) Gait Distance (Feet): 350 Feet Assistive device: Rolling walker (2 wheeled) Gait Pattern/deviations: Step-through pattern;Decreased stride length;Trunk flexed Gait velocity: decr    General Gait Details: good sequencing, no loss of balance   Stairs Stairs: Yes Stairs assistance: Supervision Stair Management: One rail Right;With cane Number of Stairs: 3 General stair comments: 3 steps x 2 trials, VCs sequencing    Wheelchair Mobility    Modified Rankin (Stroke  Patients Only)       Balance Overall balance assessment: Modified Independent                                          Cognition Arousal/Alertness: Awake/alert Behavior During Therapy: WFL for tasks assessed/performed Overall Cognitive Status: Within Functional Limits for tasks assessed                                        Exercises Total Joint Exercises Ankle Circles/Pumps: AROM;10 reps;Supine;Both Quad Sets: AROM;Left;5 reps;Supine Short Arc Quad: AROM;Left;10 reps;Supine Heel Slides: AAROM;Left;10 reps;Supine Hip ABduction/ADduction: AAROM;Left;10 reps;Supine    General Comments        Pertinent Vitals/Pain Pain Score: 3  Pain Location: L hip  Pain Descriptors / Indicators: Sore Pain Intervention(s): Limited activity within patient's tolerance;Monitored during session;Premedicated before session;Ice applied    Home Living                      Prior Function            PT Goals (current goals can now be found in the care plan section) Acute Rehab PT Goals Patient Stated Goal: kayaking PT Goal Formulation: With patient Time For Goal Achievement: 01/26/19 Potential to Achieve Goals: Good Progress towards PT goals: Progressing toward goals    Frequency    7X/week      PT Plan Current plan remains appropriate    Co-evaluation              AM-PAC  PT "6 Clicks" Mobility   Outcome Measure  Help needed turning from your back to your side while in a flat bed without using bedrails?: None Help needed moving from lying on your back to sitting on the side of a flat bed without using bedrails?: None Help needed moving to and from a bed to a chair (including a wheelchair)?: None Help needed standing up from a chair using your arms (e.g., wheelchair or bedside chair)?: None Help needed to walk in hospital room?: None Help needed climbing 3-5 steps with a railing? : A Little 6 Click Score: 23    End of Session  Equipment Utilized During Treatment: Gait belt Activity Tolerance: Patient tolerated treatment well Patient left: in chair;with call bell/phone within reach(on SCD break for shift change) Nurse Communication: Mobility status PT Visit Diagnosis: Other abnormalities of gait and mobility (R26.89);Difficulty in walking, not elsewhere classified (R26.2)     Time: 6553-7482 PT Time Calculation (min) (ACUTE ONLY): 36 min  Charges:  $Gait Training: 8-22 mins $Therapeutic Exercise: 8-22 mins                    Blondell Reveal Kistler PT 01/20/2019  Acute Rehabilitation Services Pager [phone] Office [phone]

## 2019-01-20 NOTE — Anesthesia Postprocedure Evaluation (Signed)
Anesthesia Post Note  Patient: Ronald Franklin  Procedure(s) Performed: TOTAL HIP ARTHROPLASTY ANTERIOR APPROACH (Left )     Patient location during evaluation: PACU Anesthesia Type: MAC and Spinal Level of consciousness: awake and alert Pain management: pain level controlled Vital Signs Assessment: post-procedure vital signs reviewed and stable Respiratory status: spontaneous breathing, nonlabored ventilation, respiratory function stable and patient connected to nasal cannula oxygen Cardiovascular status: stable and blood pressure returned to baseline Postop Assessment: no apparent nausea or vomiting and spinal receding Anesthetic complications: no    Last Vitals:  Vitals:   01/20/19 0455 01/20/19 1019  BP: 132/81 131/86  Pulse: (!) 53 63  Resp: 18 16  Temp: 36.7 C 37 C  SpO2: 100% 100%    Last Pain:  Vitals:   01/20/19 1019  TempSrc: Oral  PainSc:                  [name]

## 2019-01-20 NOTE — Discharge Summary (Signed)
Physician Discharge Summary  Patient ID: Ronald Franklin MRN: [id number] DOB/AGE: 11-08-1950 68 y.o.  Admit date: 01/19/2019 Discharge date: 01/20/2019  Admission Diagnoses:  Osteoarthritis of left hip  Discharge Diagnoses:  Principal Problem:   Osteoarthritis of left hip   Past Medical History:  Diagnosis Date  . Allergic rhinitis   . Arthritis   . CTS (carpal tunnel syndrome)   . OSA (obstructive sleep apnea)     Surgeries: Procedure(s): TOTAL HIP ARTHROPLASTY ANTERIOR APPROACH on 01/19/2019   Consultants (if any):   Discharged Condition: Improved  Hospital Course: JYLES SONTAG is an 68 y.o. male who was admitted 01/19/2019 with a diagnosis of Osteoarthritis of left hip and went to the operating room on 01/19/2019 and underwent the above named procedures.    He was given perioperative antibiotics:  Anti-infectives (From admission, onward)   Start     Dose/Rate Route Frequency Ordered Stop   01/19/19 1800  ceFAZolin (ANCEF) IVPB 2g/100 mL premix     2 g 200 mL/hr over 30 Minutes Intravenous Every 6 hours 01/19/19 1518 01/19/19 2346   01/19/19 1145  ceFAZolin (ANCEF) 2-4 GM/100ML-% IVPB    Note to Pharmacy:  Dellie Catholic   : cabinet override      01/19/19 1145 01/19/19 2359    .  He was given sequential compression devices, early ambulation, and ASA for DVT prophylaxis.  He benefited maximally from the hospital stay and there were no complications.    Recent vital signs:  Vitals:   01/20/19 0110 01/20/19 0455  BP: 119/74 132/81  Pulse: (!) 58 (!) 53  Resp: 18 18  Temp: 97.6 F (36.4 C) 98.1 F (36.7 C)  SpO2: 98% 100%    Recent laboratory studies:  Lab Results  Component Value Date   HGB 12.1 (L) 01/20/2019   HGB 14.4 01/16/2019   HGB 14.3 10/27/2018   Lab Results  Component Value Date   WBC 8.4 01/20/2019   PLT 153 01/20/2019   Lab Results  Component Value Date   INR 1.0 07/02/2008   Lab Results  Component Value Date   NA 137 01/20/2019   K 4.1  01/20/2019   CL 109 01/20/2019   CO2 24 01/20/2019   BUN 17 01/20/2019   CREATININE 0.81 01/20/2019   GLUCOSE 127 (H) 01/20/2019    Discharge Medications:   Allergies as of 01/20/2019   No Known Allergies     Medication List    STOP taking these medications   aspirin EC 81 MG tablet Replaced by:  aspirin 81 MG chewable tablet   atorvastatin 40 MG tablet Commonly known as:  LIPITOR     TAKE these medications   Aleve 220 MG tablet Generic drug:  naproxen sodium Take 220 mg by mouth 2 (two) times daily as needed (pain.).   aspirin 81 MG chewable tablet Chew 1 tablet (81 mg total) by mouth 2 (two) times daily. Replaces:  aspirin EC 81 MG tablet   docusate sodium 100 MG capsule Commonly known as:  COLACE Take 1 capsule (100 mg total) by mouth 2 (two) times daily.   HYDROcodone-acetaminophen 5-325 MG tablet Commonly known as:  NORCO/VICODIN Take 1-2 tablets by mouth every 4 (four) hours as needed for moderate pain (pain score 4-6).   Mucinex Sinus-Max Clear & Cool 0.05 % nasal spray Generic drug:  oxymetazoline Place 1 spray into both nostrils 2 (two) times daily as needed for congestion.   multivitamin with minerals Tabs tablet Take 1 tablet  by mouth daily. One-A-Day Men's Health   ondansetron 4 MG tablet Commonly known as:  ZOFRAN Take 1 tablet (4 mg total) by mouth every 6 (six) hours as needed for nausea.   OSTEO BI-FLEX TRIPLE STRENGTH PO Take 1 tablet by mouth daily.   senna 8.6 MG Tabs tablet Commonly known as:  SENOKOT Take 1 tablet (8.6 mg total) by mouth 2 (two) times daily.   Turmeric 500 MG Caps Take 500 mg by mouth daily.   Vitamin B-12 2500 MCG Subl Place 2,500 mcg under the tongue daily.   vitamin C 1000 MG tablet Take 2,000 mg by mouth daily.   Vitamin D-3 125 MCG (5000 UT) Tabs Take 5,000 Units by mouth daily.   vitamin E 400 UNIT capsule Take 800 Units by mouth daily.   Zinc 50 MG Tabs Take 50 mg by mouth daily.       Diagnostic  Studies: Dg Pelvis Portable  Result Date: 01/19/2019 CLINICAL DATA:  68 year old male status post left hip replacement. EXAM: PORTABLE PELVIS 1-2 VIEWS COMPARISON:  Intraoperative images 12 35 hours today. FINDINGS: Portable AP supine view at 1454 hours. Bipolar type left hip arthroplasty appears normally aligned in the AP view. No unexpected osseous changes. No other acute osseous abnormality identified. IMPRESSION: Left hip arthroplasty with no adverse features. Electronically Signed   By: Genevie Ann M.D.   On: 01/19/2019 15:15   Dg C-arm 1-60 Min-no Report  Result Date: 01/19/2019 Fluoroscopy was utilized by the requesting physician.  No radiographic interpretation.   Dg Hip Operative Unilat W Or W/o Pelvis Left  Result Date: 01/19/2019 CLINICAL DATA:  Status post left total hip arthroplasty. EXAM: OPERATIVE left HIP (WITH PELVIS IF PERFORMED) 7 VIEWS TECHNIQUE: Fluoroscopic spot image(s) were submitted for interpretation post-operatively. Radiation exposure index: 1.755 mGy. COMPARISON:  Radiographs of October 22, 2016. FINDINGS: Seven intraoperative fluoroscopic images were obtained of the left hip. The femoral and acetabular components appear to be well situated. IMPRESSION: Fluoroscopic guidance provided during left total hip arthroplasty. Electronically Signed   By: Marijo Conception M.D.   On: 01/19/2019 14:26    Disposition: Discharge disposition: 01-Home or Self Care       Discharge Instructions    Call MD / Call 911   Complete by:  As directed    If you experience chest pain or shortness of breath, CALL 911 and be transported to the hospital emergency room.  If you develope a fever above 101 F, pus (white drainage) or increased drainage or redness at the wound, or calf pain, call your surgeon's office.   Constipation Prevention   Complete by:  As directed    Drink plenty of fluids.  Prune juice may be helpful.  You may use a stool softener, such as Colace (over the counter) 100 mg twice a  day.  Use MiraLax (over the counter) for constipation as needed.   Diet - low sodium heart healthy   Complete by:  As directed    Driving restrictions   Complete by:  As directed    No driving for 4 weeks   Increase activity slowly as tolerated   Complete by:  As directed    Lifting restrictions   Complete by:  As directed    No lifting for 6 weeks   TED hose   Complete by:  As directed    Use stockings (TED hose) for 2 weeks on both leg(s).  You may remove them at night for sleeping.  Follow-up Information    Naoki Migliaccio, Aaron Edelman, MD. Schedule an appointment as soon as possible for a visit in 2 weeks.   Specialty:  Orthopedic Surgery Why:  For wound re-check Contact information: [address] [place] [address] [phone]            Signed: Hilton Cork Jais Demir 01/20/2019, 9:11 AM

## 2019-02-06 DIAGNOSIS — Z96642 Presence of left artificial hip joint: Secondary | ICD-10-CM | POA: Diagnosis not present

## 2019-02-06 DIAGNOSIS — Z471 Aftercare following joint replacement surgery: Secondary | ICD-10-CM | POA: Diagnosis not present

## 2019-03-06 DIAGNOSIS — Z96642 Presence of left artificial hip joint: Secondary | ICD-10-CM | POA: Diagnosis not present

## 2019-03-06 DIAGNOSIS — Z471 Aftercare following joint replacement surgery: Secondary | ICD-10-CM | POA: Diagnosis not present

## 2019-04-12 DIAGNOSIS — H90A31 Mixed conductive and sensorineural hearing loss, unilateral, right ear with restricted hearing on the contralateral side: Secondary | ICD-10-CM | POA: Diagnosis not present

## 2019-04-12 DIAGNOSIS — Z570 Occupational exposure to noise: Secondary | ICD-10-CM | POA: Diagnosis not present

## 2019-04-12 DIAGNOSIS — H90A22 Sensorineural hearing loss, unilateral, left ear, with restricted hearing on the contralateral side: Secondary | ICD-10-CM | POA: Diagnosis not present

## 2019-04-12 DIAGNOSIS — H9313 Tinnitus, bilateral: Secondary | ICD-10-CM | POA: Diagnosis not present

## 2019-05-04 DIAGNOSIS — H903 Sensorineural hearing loss, bilateral: Secondary | ICD-10-CM | POA: Diagnosis not present

## 2019-05-10 DIAGNOSIS — G5601 Carpal tunnel syndrome, right upper limb: Secondary | ICD-10-CM | POA: Diagnosis not present

## 2019-05-17 DIAGNOSIS — L570 Actinic keratosis: Secondary | ICD-10-CM | POA: Diagnosis not present

## 2019-05-17 DIAGNOSIS — Z85828 Personal history of other malignant neoplasm of skin: Secondary | ICD-10-CM | POA: Diagnosis not present

## 2019-05-17 DIAGNOSIS — D1801 Hemangioma of skin and subcutaneous tissue: Secondary | ICD-10-CM | POA: Diagnosis not present

## 2019-05-17 DIAGNOSIS — L814 Other melanin hyperpigmentation: Secondary | ICD-10-CM | POA: Diagnosis not present

## 2019-05-17 DIAGNOSIS — L821 Other seborrheic keratosis: Secondary | ICD-10-CM | POA: Diagnosis not present

## 2019-05-23 DIAGNOSIS — Z471 Aftercare following joint replacement surgery: Secondary | ICD-10-CM | POA: Diagnosis not present

## 2019-05-23 DIAGNOSIS — Z96642 Presence of left artificial hip joint: Secondary | ICD-10-CM | POA: Diagnosis not present

## 2019-07-21 DIAGNOSIS — Z471 Aftercare following joint replacement surgery: Secondary | ICD-10-CM | POA: Diagnosis not present

## 2019-07-21 DIAGNOSIS — Z96642 Presence of left artificial hip joint: Secondary | ICD-10-CM | POA: Diagnosis not present

## 2019-09-29 ENCOUNTER — Telehealth: Payer: Self-pay | Admitting: Internal Medicine

## 2019-09-29 DIAGNOSIS — Z9989 Dependence on other enabling machines and devices: Secondary | ICD-10-CM

## 2019-09-29 DIAGNOSIS — G4733 Obstructive sleep apnea (adult) (pediatric): Secondary | ICD-10-CM

## 2019-09-29 NOTE — Telephone Encounter (Signed)
Patient needs a part for his CPAP machine. He needs an RX request  Faxed from PCP to get this to get this. He had a sleep studied done 16 years ago and been on it since then.  Fax: [phone]

## 2019-09-29 NOTE — Telephone Encounter (Signed)
Pt contacted and he stated that he needed an rx for the mask strap or mask if not able to do rx for just the strap. DME company is [employer].

## 2019-09-30 NOTE — Telephone Encounter (Signed)
RX has been pended to be signed and printed.

## 2019-10-02 NOTE — Telephone Encounter (Signed)
Called [facility] - Informed that pt may need a new CPAP with sleep study etc or pt could pay out of pocket for the head gear strap. It is about 10-15 dollars.   Local Store is [phone] [facility] and there number is [phone].     The fax if patient wants to do the sleep study and new CPAP order is [phone].   Pt will opt for paying out of pocket for the piece that he needs at this time.

## 2019-10-02 NOTE — Telephone Encounter (Signed)
Their phone # is [phone]

## 2019-10-30 ENCOUNTER — Ambulatory Visit (INDEPENDENT_AMBULATORY_CARE_PROVIDER_SITE_OTHER): Payer: Medicare HMO | Admitting: Internal Medicine

## 2019-10-30 ENCOUNTER — Encounter: Payer: Self-pay | Admitting: Internal Medicine

## 2019-10-30 ENCOUNTER — Other Ambulatory Visit: Payer: Self-pay

## 2019-10-30 VITALS — BP 138/82 | Temp 98.5°F | Resp 16 | Ht 72.0 in | Wt 221.0 lb

## 2019-10-30 DIAGNOSIS — N400 Enlarged prostate without lower urinary tract symptoms: Secondary | ICD-10-CM

## 2019-10-30 DIAGNOSIS — D539 Nutritional anemia, unspecified: Secondary | ICD-10-CM | POA: Diagnosis not present

## 2019-10-30 DIAGNOSIS — Z0000 Encounter for general adult medical examination without abnormal findings: Secondary | ICD-10-CM

## 2019-10-30 DIAGNOSIS — E785 Hyperlipidemia, unspecified: Secondary | ICD-10-CM | POA: Diagnosis not present

## 2019-10-30 DIAGNOSIS — I10 Essential (primary) hypertension: Secondary | ICD-10-CM

## 2019-10-30 DIAGNOSIS — R739 Hyperglycemia, unspecified: Secondary | ICD-10-CM | POA: Diagnosis not present

## 2019-10-30 DIAGNOSIS — K439 Ventral hernia without obstruction or gangrene: Secondary | ICD-10-CM

## 2019-10-30 LAB — HEPATIC FUNCTION PANEL
ALT: 30 U/L (ref 0–53)
AST: 22 U/L (ref 0–37)
Albumin: 4.2 g/dL (ref 3.5–5.2)
Alkaline Phosphatase: 79 U/L (ref 39–117)
Bilirubin, Direct: 0.1 mg/dL (ref 0.0–0.3)
Total Bilirubin: 0.4 mg/dL (ref 0.2–1.2)
Total Protein: 6.8 g/dL (ref 6.0–8.3)

## 2019-10-30 LAB — URINALYSIS, ROUTINE W REFLEX MICROSCOPIC
Bilirubin Urine: NEGATIVE
Hgb urine dipstick: NEGATIVE
Ketones, ur: NEGATIVE
Leukocytes,Ua: NEGATIVE
Nitrite: NEGATIVE
RBC / HPF: NONE SEEN (ref 0–?)
Specific Gravity, Urine: 1.02 (ref 1.000–1.030)
Total Protein, Urine: NEGATIVE
Urine Glucose: NEGATIVE
Urobilinogen, UA: 0.2 (ref 0.0–1.0)
WBC, UA: NONE SEEN (ref 0–?)
pH: 6 (ref 5.0–8.0)

## 2019-10-30 LAB — LIPID PANEL
Cholesterol: 234 mg/dL — ABNORMAL HIGH (ref 0–200)
HDL: 46.1 mg/dL (ref >39.00)
LDL Cholesterol: 160 mg/dL — ABNORMAL HIGH (ref 0–99)
NonHDL: 187.64
Total CHOL/HDL Ratio: 5
Triglycerides: 140 mg/dL (ref 0.0–149.0)
VLDL: 28 mg/dL (ref 0.0–40.0)

## 2019-10-30 LAB — BASIC METABOLIC PANEL
BUN: 24 mg/dL — ABNORMAL HIGH (ref 6–23)
CO2: 25 mEq/L (ref 19–32)
Calcium: 9.5 mg/dL (ref 8.4–10.5)
Chloride: 104 mEq/L (ref 96–112)
Creatinine, Ser: 1.04 mg/dL (ref 0.40–1.50)
GFR: 70.63 mL/min (ref >60.00)
Glucose, Bld: 100 mg/dL — ABNORMAL HIGH (ref 70–99)
Potassium: 3.9 mEq/L (ref 3.5–5.1)
Sodium: 138 mEq/L (ref 135–145)

## 2019-10-30 LAB — TSH: TSH: 2.66 u[IU]/mL (ref 0.35–4.50)

## 2019-10-30 LAB — FOLATE: Folate: >23.7 ng/mL (ref >5.9)

## 2019-10-30 LAB — IBC PANEL
Iron: 84 ug/dL (ref 42–165)
Saturation Ratios: 22.7 % (ref 20.0–50.0)
Transferrin: 264 mg/dL (ref 212.0–360.0)

## 2019-10-30 LAB — PSA: PSA: 2.28 ng/mL (ref 0.10–4.00)

## 2019-10-30 LAB — VITAMIN B12: Vitamin B-12: 1176 pg/mL — ABNORMAL HIGH (ref 211–911)

## 2019-10-30 LAB — HEMOGLOBIN A1C: Hgb A1c MFr Bld: 5.8 % (ref 4.6–6.5)

## 2019-10-30 LAB — FERRITIN: Ferritin: 110.3 ng/mL (ref 22.0–322.0)

## 2019-10-30 NOTE — Progress Notes (Signed)
Subjective:  Patient ID: Ronald Franklin, male    DOB: [birth year]  Age: 69 y.o. MRN: [id number]  CC: Anemia, Annual Exam, Hypertension, and Hyperlipidemia  This visit occurred during the SARS-CoV-2 public health emergency.  Safety protocols were in place, including screening questions prior to the visit, additional usage of staff PPE, and extensive cleaning of exam room while observing appropriate contact time as indicated for disinfecting solutions.    HPI CONCETTO SINGLETON presents for a CPX.  He underwent orthopedic surgery about 9 months ago.  He was found to have perioperative anemia.  He does not feel anemic.  He denies fatigue or shortness of breath.  He is very active and denies chest pain, diaphoresis, dizziness, lightheadedness, or fatigue.  He complains of weight gain.  He also complains that he has an enlarging ventral hernia and wants to know if a surgeon can repair it.  Outpatient Medications Prior to Visit  Medication Sig Dispense Refill  . Ascorbic Acid (VITAMIN C) 1000 MG tablet Take 2,000 mg by mouth daily.     Marland Kitchen aspirin 81 MG chewable tablet Chew 1 tablet (81 mg total) by mouth 2 (two) times daily. 60 tablet 1  . Cholecalciferol (VITAMIN D-3) 125 MCG (5000 UT) TABS Take 5,000 Units by mouth daily.    . Cyanocobalamin (VITAMIN B-12) 2500 MCG SUBL Place 2,500 mcg under the tongue daily.    . Misc Natural Products (OSTEO BI-FLEX TRIPLE STRENGTH PO) Take 1 tablet by mouth daily.    Marland Kitchen MUCINEX SINUS-MAX CLEAR & COOL 0.05 % nasal spray Place 1 spray into both nostrils 2 (two) times daily as needed for congestion.    . Multiple Vitamin (MULTIVITAMIN WITH MINERALS) TABS tablet Take 1 tablet by mouth daily. One-A-Day Men's Health    . Turmeric 500 MG CAPS Take 500 mg by mouth daily.     . vitamin E 400 UNIT capsule Take 800 Units by mouth daily.    . Zinc 50 MG TABS Take 50 mg by mouth daily.    . naproxen sodium (ALEVE) 220 MG tablet Take 220 mg by mouth 2 (two) times daily as  needed (pain.).     Marland Kitchen docusate sodium (COLACE) 100 MG capsule Take 1 capsule (100 mg total) by mouth 2 (two) times daily. 60 capsule 1  . HYDROcodone-acetaminophen (NORCO/VICODIN) 5-325 MG tablet Take 1-2 tablets by mouth every 4 (four) hours as needed for moderate pain (pain score 4-6). 30 tablet 0  . ondansetron (ZOFRAN) 4 MG tablet Take 1 tablet (4 mg total) by mouth every 6 (six) hours as needed for nausea. 20 tablet 0  . senna (SENOKOT) 8.6 MG TABS tablet Take 1 tablet (8.6 mg total) by mouth 2 (two) times daily. 120 each 0   No facility-administered medications prior to visit.    ROS Review of Systems  Constitutional: Positive for unexpected weight change. Negative for appetite change, diaphoresis and fatigue.  HENT: Negative.   Eyes: Negative for visual disturbance.  Respiratory: Negative for cough, chest tightness and shortness of breath.   Cardiovascular: Negative for chest pain, palpitations and leg swelling.  Gastrointestinal: Negative for abdominal pain, blood in stool, constipation, diarrhea, nausea and vomiting.  Endocrine: Negative.  Negative for cold intolerance and heat intolerance.  Genitourinary: Negative.  Negative for difficulty urinating, dysuria, hematuria, penile swelling, scrotal swelling, testicular pain and urgency.  Musculoskeletal: Positive for arthralgias. Negative for myalgias.  Skin: Negative.   Neurological: Negative.  Negative for dizziness, weakness, light-headedness and headaches.  Hematological: Negative for adenopathy. Does not bruise/bleed easily.  Psychiatric/Behavioral: Negative.     Objective:  BP 138/82 (BP Location: Left Arm, Patient Position: Sitting, Cuff Size: Large)   Temp 98.5 F (36.9 C) (Oral)   Resp 16   Ht 6' (1.829 m)   Wt 221 lb (100.2 kg)   SpO2 98%   BMI 29.97 kg/m   BP Readings from Last 3 Encounters:  10/30/19 138/82  01/20/19 131/86  01/16/19 (!) 146/80    Wt Readings from Last 3 Encounters:  10/30/19 221 lb  (100.2 kg)  01/19/19 210 lb 4.8 oz (95.4 kg)  01/16/19 210 lb 4.8 oz (95.4 kg)    Physical Exam Vitals reviewed.  Constitutional:      Appearance: Normal appearance.  HENT:     Nose: Nose normal.     Mouth/Throat:     Mouth: Mucous membranes are moist.  Eyes:     General: No scleral icterus.    Conjunctiva/sclera: Conjunctivae normal.  Cardiovascular:     Rate and Rhythm: Normal rate and regular rhythm.     Heart sounds: No murmur.     Comments: EKG -  NSR Normal EKG Pulmonary:     Effort: Pulmonary effort is normal.     Breath sounds: No stridor. No wheezing, rhonchi or rales.  Abdominal:     General: Abdomen is protuberant. There is no distension.     Palpations: There is no hepatomegaly, splenomegaly or mass.     Tenderness: There is no abdominal tenderness. There is no guarding.     Hernia: A hernia is present. Hernia is present in the umbilical area and ventral area. There is no hernia in the left inguinal area or right inguinal area.  Genitourinary:    Pubic Area: No rash.      Penis: Normal and circumcised. No discharge, swelling or lesions.      Testes: Normal.        Right: Mass, tenderness or swelling not present.        Left: Mass, tenderness or swelling not present.     Epididymis:     Right: Normal. Not enlarged. No mass.     Left: Not inflamed or enlarged. No mass.     Prostate: Enlarged. Not tender and no nodules present.     Rectum: Internal hemorrhoid present. No mass, tenderness, anal fissure or external hemorrhoid. Normal anal tone.  Musculoskeletal:        General: No swelling. Normal range of motion.     Cervical back: Normal range of motion.     Right lower leg: No edema.     Left lower leg: No edema.  Lymphadenopathy:     Lower Body: No right inguinal adenopathy. No left inguinal adenopathy.  Skin:    General: Skin is warm and dry.     Findings: No lesion or rash.  Neurological:     General: No focal deficit present.     Mental Status: He  is alert.  Psychiatric:        Mood and Affect: Mood normal.        Behavior: Behavior normal.     Lab Results  Component Value Date   WBC 6.0 10/31/2019   HGB 14.3 10/31/2019   HCT 42.1 10/31/2019   PLT 172.0 10/31/2019   GLUCOSE 100 (H) 10/30/2019   CHOL 234 (H) 10/30/2019   TRIG 140.0 10/30/2019   HDL 46.10 10/30/2019   LDLDIRECT 206.6 08/04/2013   LDLCALC 160 (H) 10/30/2019  ALT 30 10/30/2019   AST 22 10/30/2019   NA 138 10/30/2019   K 3.9 10/30/2019   CL 104 10/30/2019   CREATININE 1.04 10/30/2019   BUN 24 (H) 10/30/2019   CO2 25 10/30/2019   TSH 2.66 10/30/2019   PSA 2.28 10/30/2019   INR 1.0 07/02/2008   HGBA1C 5.8 10/30/2019    No results found.  Assessment & Plan:   Aristotle was seen today for anemia, annual exam, hypertension and hyperlipidemia.  Diagnoses and all orders for this visit:  Essential hypertension- His blood pressure is well controlled with lifestyle modifications.  Electrolytes and renal function are normal.  Medical therapy is not indicated. -     Basic metabolic panel -     TSH -     Urinalysis, Routine w reflex microscopic -     EKG 12-Lead  Routine general medical examination at a health care facility- Exam completed, labs reviewed, he refused a flu vaccine, cancer screenings are up-to-date, patient education material was given.  Hyperlipidemia with target LDL less than 160- He has an elevated ASCVD risk score but is not willing to take a statin for CV risk reduction. -     Lipid panel -     TSH -     Hepatic function panel  Deficiency anemia- His H&H are normal now.  His vitamin levels are all normal. -     IBC panel -     Vitamin B12 -     Folate -     Ferritin -     Vitamin B1 -     Cancel: CBC with Differential/Platelet -     CBC with Differential/Platelet; Future  Hyperglycemia- His blood sugars are normal now. -     Basic metabolic panel -     Hemoglobin A1c  Benign prostatic hyperplasia without lower urinary tract  symptoms- His PSA is normal which is a reassuring sign that he does not have prostate cancer.  He has no symptoms that need to be treated. -     PSA  Ventral hernia without obstruction or gangrene -     Ambulatory referral to General Surgery   I have discontinued Dontarious K. Cato "Ken"'s naproxen sodium, HYDROcodone-acetaminophen, docusate sodium, ondansetron, and senna. I am also having him maintain his vitamin C, Turmeric, Mucinex Sinus-Max Clear & Cool, Vitamin B-12, Misc Natural Products (OSTEO BI-FLEX TRIPLE STRENGTH PO), Zinc, Vitamin D-3, vitamin E, multivitamin with minerals, and aspirin.  No orders of the defined types were placed in this encounter.    Follow-up: Return in about 6 months (around 05/01/2020).  Scarlette Calico, MD

## 2019-10-30 NOTE — Patient Instructions (Signed)

## 2019-10-31 ENCOUNTER — Other Ambulatory Visit (INDEPENDENT_AMBULATORY_CARE_PROVIDER_SITE_OTHER): Payer: Medicare HMO

## 2019-10-31 ENCOUNTER — Encounter: Payer: Self-pay | Admitting: Internal Medicine

## 2019-10-31 DIAGNOSIS — D539 Nutritional anemia, unspecified: Secondary | ICD-10-CM

## 2019-10-31 LAB — CBC WITH DIFFERENTIAL/PLATELET
Basophils Absolute: 0.1 10*3/uL (ref 0.0–0.1)
Basophils Relative: 1.4 % (ref 0.0–3.0)
Eosinophils Absolute: 0.2 10*3/uL (ref 0.0–0.7)
Eosinophils Relative: 3 % (ref 0.0–5.0)
HCT: 42.1 % (ref 39.0–52.0)
Hemoglobin: 14.3 g/dL (ref 13.0–17.0)
Lymphocytes Relative: 37.7 % (ref 12.0–46.0)
Lymphs Abs: 2.3 10*3/uL (ref 0.7–4.0)
MCHC: 34 g/dL (ref 30.0–36.0)
MCV: 93 fl (ref 78.0–100.0)
Monocytes Absolute: 0.6 10*3/uL (ref 0.1–1.0)
Monocytes Relative: 9.9 % (ref 3.0–12.0)
Neutro Abs: 2.9 10*3/uL (ref 1.4–7.7)
Neutrophils Relative %: 48 % (ref 43.0–77.0)
Platelets: 172 10*3/uL (ref 150.0–400.0)
RBC: 4.52 Mil/uL (ref 4.22–5.81)
RDW: 13.3 % (ref 11.5–15.5)
WBC: 6 10*3/uL (ref 4.0–10.5)

## 2019-11-08 LAB — VITAMIN B1: Vitamin B1 (Thiamine): 25 nmol/L (ref 8–30)

## 2020-04-03 ENCOUNTER — Telehealth: Payer: Self-pay | Admitting: Internal Medicine

## 2020-04-03 NOTE — Telephone Encounter (Signed)
New Message:   Pt is calling and states he thinks he had covid about a month ago and would like to have antibody test find out. Please advise.

## 2020-04-08 ENCOUNTER — Encounter: Payer: Self-pay | Admitting: Internal Medicine

## 2020-04-08 ENCOUNTER — Ambulatory Visit (INDEPENDENT_AMBULATORY_CARE_PROVIDER_SITE_OTHER): Payer: Medicare HMO | Admitting: Internal Medicine

## 2020-04-08 ENCOUNTER — Other Ambulatory Visit: Payer: Self-pay

## 2020-04-08 VITALS — BP 136/78 | HR 71 | Temp 99.1°F | Resp 16 | Ht 72.0 in | Wt 218.0 lb

## 2020-04-08 DIAGNOSIS — J40 Bronchitis, not specified as acute or chronic: Secondary | ICD-10-CM | POA: Diagnosis not present

## 2020-04-08 LAB — SARS-COV-2 IGG: SARS-COV-2 IgG: 0.04

## 2020-04-08 NOTE — Patient Instructions (Signed)
Acute Bronchitis, Adult  Acute bronchitis is sudden or acute swelling of the air tubes (bronchi) in the lungs. Acute bronchitis causes these tubes to fill with mucus, which can make it hard to breathe. It can also cause coughing or wheezing. In adults, acute bronchitis usually goes away within 2 weeks. A cough caused by bronchitis may last up to 3 weeks. Smoking, allergies, and asthma can make the condition worse. What are the causes? This condition can be caused by germs and by substances that irritate the lungs, including:  Cold and flu viruses. The most common cause of this condition is the virus that causes the common cold.  Bacteria.  Substances that irritate the lungs, including: ? Smoke from cigarettes and other forms of tobacco. ? Dust and pollen. ? Fumes from chemical products, gases, or burned fuel. ? Other materials that pollute indoor or outdoor air.  Close contact with someone who has acute bronchitis. What increases the risk? The following factors may make you more likely to develop this condition:  A weak body's defense system, also called the immune system.  A condition that affects your lungs and breathing, such as asthma. What are the signs or symptoms? Common symptoms of this condition include:  Lung and breathing problems, such as: ? Coughing. This may bring up clear, yellow, or green mucus from your lungs (sputum). ? Wheezing. ? Having too much mucus in your lungs (chest congestion). ? Having shortness of breath.  A fever.  Chills.  Aches and pains, including: ? Tightness in your chest and other body aches. ? A sore throat. How is this diagnosed? This condition is usually diagnosed based on:  Your symptoms and medical history.  A physical exam. You may also have other tests, including tests to rule out other conditions, such pneumonia. These tests include:  A test of lung function.  Test of a mucus sample to look for the presence of  bacteria.  Tests to check the oxygen level in your blood.  Blood tests.  Chest X-ray. How is this treated? Most cases of acute bronchitis clear up over time without treatment. Your health care provider may recommend:  Drinking more fluids. This can thin your mucus, which may improve your breathing.  Taking a medicine for a fever or cough.  Using a device that gets medicine into your lungs (inhaler) to help improve breathing and control coughing.  Using a vaporizer or a humidifier. These are machines that add water to the air to help you breathe better. Follow these instructions at home: Activity  Get plenty of rest.  Return to your normal activities as told by your health care provider. Ask your health care provider what activities are safe for you. Lifestyle  Drink enough fluid to keep your urine pale yellow.  Do not drink alcohol.  Do not use any products that contain nicotine or tobacco, such as cigarettes, e-cigarettes, and chewing tobacco. If you need help quitting, ask your health care provider. Be aware that: ? Your bronchitis will get worse if you smoke or breathe in other people's smoke (secondhand smoke). ? Your lungs will heal faster if you quit smoking. General instructions   Take over-the-counter and prescription medicines only as told by your health care provider.  Use an inhaler, vaporizer, or humidifier as told by your health care provider.  If you have a sore throat, gargle with a salt-water mixture 3-4 times a day or as needed. To make a salt-water mixture, completely dissolve -1 tsp (3-6   g) of salt in 1 cup (237 mL) of warm water.  Keep all follow-up visits as told by your health care provider. This is important. How is this prevented? To lower your risk of getting this condition again:  Wash your hands often with soap and water. If soap and water are not available, use hand sanitizer.  Avoid contact with people who have cold symptoms.  Try not to  touch your mouth, nose, or eyes with your hands.  Avoid places where there are fumes from chemicals. Breathing these fumes will make your condition worse.  Get the flu shot every year. Contact a health care provider if:  Your symptoms do not improve after 2 weeks of treatment.  You vomit more than once or twice.  You have symptoms of dehydration such as: ? Dark urine. ? Dry skin or eyes. ? Increased thirst. ? Headaches. ? Confusion. ? Muscle cramps. Get help right away if you:  Cough up blood.  Feel pain in your chest.  Have severe shortness of breath.  Faint or keep feeling like you are going to faint.  Have a severe headache.  Have fever or chills that get worse. These symptoms may represent a serious problem that is an emergency. Do not wait to see if the symptoms will go away. Get medical help right away. Call your local emergency services (911 in the [place] Do not drive yourself to the hospital. Summary  Acute bronchitis is sudden (acute) inflammation of the air tubes (bronchi) between the windpipe and the lungs. In adults, acute bronchitis usually goes away within 2 weeks, although coughing may last 3 weeks or longer  Take over-the-counter and prescription medicines only as told by your health care provider.  Drink enough fluid to keep your urine pale yellow.  Contact a health care provider if your symptoms do not improve after 2 weeks of treatment.  Get help right away if you cough up blood, faint, or have chest pain or shortness of breath. This information is not intended to replace advice given to you by your health care provider. Make sure you discuss any questions you have with your health care provider. Document Revised: 04/17/2019 Document Reviewed: 02/24/2019 Elsevier Patient Education  2020 [employer]  

## 2020-04-08 NOTE — Progress Notes (Signed)
Subjective:  Patient ID: [name], male    DOB: 1951/02/21  Age: 69 y.o. MRN: [id number]  CC: URI  This visit occurred during the SARS-CoV-2 public health emergency.  Safety protocols were in place, including screening questions prior to the visit, additional usage of staff PPE, and extensive cleaning of exam room while observing appropriate contact time as indicated for disinfecting solutions.    HPI [name] presents for f/up - He tells me that about 2 months ago he had an episode of what he described as bronchitis.  He tells me that all of the symptoms have resolved but he wants to do an antibody test to see if it was a COVID-19 infection.  He has decided not to get the COVID-19 vaccination.  Outpatient Medications Prior to Visit  Medication Sig Dispense Refill  . Ascorbic Acid (VITAMIN C) 1000 MG tablet Take 2,000 mg by mouth daily.     Marland Kitchen aspirin 81 MG chewable tablet Chew 1 tablet (81 mg total) by mouth 2 (two) times daily. 60 tablet 1  . Cholecalciferol (VITAMIN D-3) 125 MCG (5000 UT) TABS Take 5,000 Units by mouth daily.    . Cyanocobalamin (VITAMIN B-12) 2500 MCG SUBL Place 2,500 mcg under the tongue daily.    . Menaquinone-7 (VITAMIN K2 PO) Take by mouth.    . Misc Natural Products (OSTEO BI-FLEX TRIPLE STRENGTH PO) Take 1 tablet by mouth daily.    . Multiple Vitamin (MULTIVITAMIN WITH MINERALS) TABS tablet Take 1 tablet by mouth daily. One-A-Day Men's Health    . Turmeric 500 MG CAPS Take 500 mg by mouth daily.     . vitamin E 400 UNIT capsule Take 800 Units by mouth daily.    . Zinc 50 MG TABS Take 50 mg by mouth daily.    Marland Kitchen MUCINEX SINUS-MAX CLEAR & COOL 0.05 % nasal spray Place 1 spray into both nostrils 2 (two) times daily as needed for congestion.     No facility-administered medications prior to visit.    ROS Review of Systems  Constitutional: Negative for chills, fatigue and fever.  HENT: Negative.  Negative for sore throat.   Eyes: Negative.     Respiratory: Negative for cough, shortness of breath and wheezing.   Cardiovascular: Negative for chest pain, palpitations and leg swelling.  Gastrointestinal: Negative for abdominal pain, constipation, diarrhea, nausea and vomiting.  Endocrine: Negative.   Genitourinary: Negative.   Musculoskeletal: Negative.  Negative for arthralgias and myalgias.  Skin: Negative.  Negative for color change and pallor.  Neurological: Negative.   Hematological: Negative for adenopathy. Does not bruise/bleed easily.    Objective:  BP 136/78 (BP Location: Left Arm, Patient Position: Sitting, Cuff Size: Large)   Pulse 71   Temp 99.1 F (37.3 C) (Oral)   Resp 16   Ht 6' (1.829 m)   Wt 218 lb (98.9 kg)   SpO2 97%   BMI 29.57 kg/m   BP Readings from Last 3 Encounters:  04/08/20 136/78  10/30/19 138/82  01/20/19 131/86    Wt Readings from Last 3 Encounters:  04/08/20 218 lb (98.9 kg)  10/30/19 221 lb (100.2 kg)  01/19/19 210 lb 4.8 oz (95.4 kg)    Physical Exam Vitals reviewed.  Constitutional:      Appearance: Normal appearance.  HENT:     Nose: Nose normal.     Mouth/Throat:     Mouth: Mucous membranes are moist.  Eyes:     General: No scleral icterus.  Conjunctiva/sclera: Conjunctivae normal.  Cardiovascular:     Rate and Rhythm: Normal rate and regular rhythm.     Heart sounds: No murmur heard.   Pulmonary:     Effort: Pulmonary effort is normal.     Breath sounds: No stridor. No wheezing, rhonchi or rales.  Abdominal:     General: Abdomen is flat.     Palpations: There is no mass.     Tenderness: There is no abdominal tenderness.  Musculoskeletal:        General: Normal range of motion.     Cervical back: Neck supple.     Right lower leg: No edema.     Left lower leg: No edema.  Lymphadenopathy:     Cervical: No cervical adenopathy.  Skin:    General: Skin is warm and dry.     Coloration: Skin is not pale.     Findings: No rash.  Neurological:     General: No  focal deficit present.     Mental Status: He is alert.  Psychiatric:        Mood and Affect: Mood normal.        Behavior: Behavior normal.     Lab Results  Component Value Date   WBC 6.0 10/31/2019   HGB 14.3 10/31/2019   HCT 42.1 10/31/2019   PLT 172.0 10/31/2019   GLUCOSE 100 (H) 10/30/2019   CHOL 234 (H) 10/30/2019   TRIG 140.0 10/30/2019   HDL 46.10 10/30/2019   LDLDIRECT 206.6 08/04/2013   LDLCALC 160 (H) 10/30/2019   ALT 30 10/30/2019   AST 22 10/30/2019   NA 138 10/30/2019   K 3.9 10/30/2019   CL 104 10/30/2019   CREATININE 1.04 10/30/2019   BUN 24 (H) 10/30/2019   CO2 25 10/30/2019   TSH 2.66 10/30/2019   PSA 2.28 10/30/2019   INR 1.0 07/02/2008   HGBA1C 5.8 10/30/2019    No results found.  Assessment & Plan:   Samarth was seen today for uri.  Diagnoses and all orders for this visit:  Bronchitis- This has resolved.  His COVID-19 antibody is negative.  He is not willing to get a COVID-19 vaccination. -     SARS-COV-2 IgG; Future -     SARS-COV-2 IgG   I have discontinued Justn K. Mcchristian "Ken"'s Mucinex Sinus-Max Clear & Cool. I am also having him maintain his vitamin C, Turmeric, Vitamin B-12, Misc Natural Products (OSTEO BI-FLEX TRIPLE STRENGTH PO), Zinc, Vitamin D-3, vitamin E, multivitamin with minerals, aspirin, and Menaquinone-7 (VITAMIN K2 PO).  No orders of the defined types were placed in this encounter.    Follow-up: Return if symptoms worsen or fail to improve.  Scarlette Calico, MD

## 2020-08-19 ENCOUNTER — Telehealth: Payer: Self-pay | Admitting: Internal Medicine

## 2020-08-19 NOTE — Telephone Encounter (Signed)
Patient calling to request cough medication be called to pharmacy on file He states his wife is positive for covid He has not been tested but has fever, cough, congestion  Please advise

## 2020-08-20 ENCOUNTER — Other Ambulatory Visit: Payer: Self-pay | Admitting: Internal Medicine

## 2020-08-20 DIAGNOSIS — U071 COVID-19: Secondary | ICD-10-CM

## 2020-08-20 MED ORDER — HYDROCOD POLST-CPM POLST ER 10-8 MG/5ML PO SUER: 5.0000 mL | Freq: Two times a day (BID) | ORAL | 0 refills | Status: DC | PRN

## 2020-08-20 NOTE — Telephone Encounter (Signed)
This could be pneumonia He needs to be seen

## 2020-08-20 NOTE — Telephone Encounter (Signed)
I have spoken to the pt and he stated "without a doubt I know I have it" mentioned that he has a 101 degree temp along with no taste or smell. He would like to know what do PCP recommend for the "tickly cough" that he is having. Please advise.

## 2020-08-22 ENCOUNTER — Other Ambulatory Visit: Payer: Self-pay | Admitting: Internal Medicine

## 2020-08-22 ENCOUNTER — Telehealth: Payer: Self-pay | Admitting: Internal Medicine

## 2020-08-22 DIAGNOSIS — U071 COVID-19: Secondary | ICD-10-CM

## 2020-08-22 NOTE — Telephone Encounter (Signed)
I sent in an RX 2 days ago

## 2020-08-22 NOTE — Telephone Encounter (Signed)
Pt has been informed and will check with the pharmacy.

## 2020-08-22 NOTE — Telephone Encounter (Signed)
Patient feels he is Covid pos, lost taste and smell 5-7 days ago, had 101 fever  Wife tested positive, but Mr. Runkle never took an official test  Now he has remaining cough and throat tickle, can't sleep at night, would like cough medicine with codeine or something to help him sleep  [id number] [place], [place] - [address] Phone:  [phone]  Fax:  [phone]     Please follow-up with Mr. Doughman at [phone]

## 2020-08-31 ENCOUNTER — Other Ambulatory Visit: Payer: Self-pay

## 2020-08-31 ENCOUNTER — Emergency Department (HOSPITAL_COMMUNITY)
Admission: EM | Admit: 2020-08-31 | Discharge: 2020-08-31 | Disposition: A | Discharge status: Home / Self Care | Payer: Medicare HMO | Attending: Emergency Medicine | Admitting: Emergency Medicine

## 2020-08-31 ENCOUNTER — Encounter (HOSPITAL_COMMUNITY): Payer: Self-pay | Admitting: Emergency Medicine

## 2020-08-31 ENCOUNTER — Emergency Department (HOSPITAL_COMMUNITY): Payer: Medicare HMO

## 2020-08-31 DIAGNOSIS — I728 Aneurysm of other specified arteries: Secondary | ICD-10-CM | POA: Diagnosis present

## 2020-08-31 DIAGNOSIS — N200 Calculus of kidney: Secondary | ICD-10-CM | POA: Insufficient documentation

## 2020-08-31 DIAGNOSIS — Z8616 Personal history of COVID-19: Secondary | ICD-10-CM | POA: Diagnosis not present

## 2020-08-31 DIAGNOSIS — Z7982 Long term (current) use of aspirin: Secondary | ICD-10-CM | POA: Diagnosis not present

## 2020-08-31 DIAGNOSIS — Z96642 Presence of left artificial hip joint: Secondary | ICD-10-CM | POA: Diagnosis not present

## 2020-08-31 DIAGNOSIS — I10 Essential (primary) hypertension: Secondary | ICD-10-CM | POA: Diagnosis not present

## 2020-08-31 DIAGNOSIS — R1030 Lower abdominal pain, unspecified: Secondary | ICD-10-CM | POA: Diagnosis present

## 2020-08-31 HISTORY — DX: Essential (primary) hypertension: I10

## 2020-08-31 LAB — COMPREHENSIVE METABOLIC PANEL
ALT: 89 U/L — ABNORMAL HIGH (ref 0–44)
AST: 36 U/L (ref 15–41)
Albumin: 3.7 g/dL (ref 3.5–5.0)
Alkaline Phosphatase: 78 U/L (ref 38–126)
Anion gap: 12 (ref 5–15)
BUN: 26 mg/dL — ABNORMAL HIGH (ref 8–23)
CO2: 25 mmol/L (ref 22–32)
Calcium: 10.5 mg/dL — ABNORMAL HIGH (ref 8.9–10.3)
Chloride: 100 mmol/L (ref 98–111)
Creatinine, Ser: 1.39 mg/dL — ABNORMAL HIGH (ref 0.61–1.24)
GFR, Estimated: 55 mL/min — ABNORMAL LOW (ref >60)
Glucose, Bld: 166 mg/dL — ABNORMAL HIGH (ref 70–99)
Potassium: 4.5 mmol/L (ref 3.5–5.1)
Sodium: 137 mmol/L (ref 135–145)
Total Bilirubin: 0.7 mg/dL (ref 0.3–1.2)
Total Protein: 7.7 g/dL (ref 6.5–8.1)

## 2020-08-31 LAB — CBC
HCT: 44 % (ref 39.0–52.0)
Hemoglobin: 14.8 g/dL (ref 13.0–17.0)
MCH: 31.2 pg (ref 26.0–34.0)
MCHC: 33.6 g/dL (ref 30.0–36.0)
MCV: 92.8 fL (ref 80.0–100.0)
Platelets: 336 10*3/uL (ref 150–400)
RBC: 4.74 MIL/uL (ref 4.22–5.81)
RDW: 12.8 % (ref 11.5–15.5)
WBC: 19.5 10*3/uL — ABNORMAL HIGH (ref 4.0–10.5)
nRBC: 0 % (ref 0.0–0.2)

## 2020-08-31 LAB — URINALYSIS, ROUTINE W REFLEX MICROSCOPIC
Bilirubin Urine: NEGATIVE
Glucose, UA: NEGATIVE mg/dL
Hgb urine dipstick: NEGATIVE
Ketones, ur: NEGATIVE mg/dL
Leukocytes,Ua: NEGATIVE
Nitrite: NEGATIVE
Protein, ur: NEGATIVE mg/dL
Specific Gravity, Urine: 1.02 (ref 1.005–1.030)
pH: 6 (ref 5.0–8.0)

## 2020-08-31 LAB — LIPASE, BLOOD: Lipase: 25 U/L (ref 11–51)

## 2020-08-31 MED ORDER — ONDANSETRON 4 MG PO TBDP: 4.0000 mg | ORAL_TABLET | Freq: Three times a day (TID) | ORAL | 0 refills | Status: DC | PRN

## 2020-08-31 MED ORDER — ONDANSETRON 4 MG PO TBDP
4.0000 mg | ORAL_TABLET | Freq: Once | ORAL | Status: AC | PRN
  Administered 2020-08-31: 4 mg via ORAL
  Filled 2020-08-31: qty 1

## 2020-08-31 MED ORDER — IOHEXOL 300 MG/ML  SOLN
100.0000 mL | Freq: Once | INTRAMUSCULAR | Status: AC | PRN
  Administered 2020-08-31: 100 mL via INTRAVENOUS

## 2020-08-31 MED ORDER — KETOROLAC TROMETHAMINE 15 MG/ML IJ SOLN
15.0000 mg | Freq: Once | INTRAMUSCULAR | Status: AC
  Administered 2020-08-31: 15 mg via INTRAVENOUS
  Filled 2020-08-31: qty 1

## 2020-08-31 MED ORDER — OXYCODONE-ACETAMINOPHEN 5-325 MG PO TABS: 1.0000 | ORAL_TABLET | Freq: Four times a day (QID) | ORAL | 0 refills | Status: AC | PRN

## 2020-08-31 MED ORDER — FENTANYL CITRATE (PF) 100 MCG/2ML IJ SOLN
50.0000 ug | Freq: Once | INTRAMUSCULAR | Status: AC
  Administered 2020-08-31: 50 ug via INTRAVENOUS
  Filled 2020-08-31: qty 2

## 2020-08-31 MED ORDER — TAMSULOSIN HCL 0.4 MG PO CAPS: 0.4000 mg | ORAL_CAPSULE | Freq: Every day | ORAL | 0 refills | Status: DC

## 2020-08-31 NOTE — Discharge Instructions (Signed)
You have a kidney stone.  Please follow-up with urology.  Take pain medicine as needed.  Zofran as needed for nausea.  Return to ER if you develop any fever, vomiting or uncontrolled pain.  The radiologist noted an incidental finding of a splenic artery aneurysm and recommends repeat imaging in 6 to 12 months.  You can follow-up with your primary doctor or call the imaging center.

## 2020-08-31 NOTE — ED Triage Notes (Signed)
Patient c/o LLQ pain to left groin and lower back x1 day. Dysuria and urinary frequency. Covid+ 1/6.

## 2020-08-31 NOTE — ED Notes (Signed)
Patient transported to CT 

## 2020-09-01 NOTE — ED Provider Notes (Addendum)
[facility] DEPT Provider Note   CSN: [id number] Arrival date & time: 08/31/20  1130     History Chief Complaint  Patient presents with  . Abdominal Pain  . Back Pain  . Groin Pain    Ronald Franklin is a 70 y.o. male.  Presents to ER with concern for left lower abdominal pain.  Patient states pain started in his left side, radiated down to his left groin.  No pain in his testicles.  Pain primarily in his left lower abdomen.  Comes and goes in waves sharp, stabbing pain.  Had some associated nausea but no vomiting.  No fevers.  Had COVID a couple weeks ago, reports testing positive on 1/6.  States that his symptoms were relatively mild and he had a complete resolution of his illness.  Not vaccinated.  Denies chronic medical conditions.  Per review of chart has hypertension, OSA.  HPI     Past Medical History:  Diagnosis Date  . Allergic rhinitis   . Arthritis   . CTS (carpal tunnel syndrome)   . Hypertension   . OSA (obstructive sleep apnea)     Patient Active Problem List   Diagnosis Date Noted  . Splenic artery aneurysm ([place]) 08/31/2020  . Clinical diagnosis of COVID-19 08/20/2020  . Bronchitis 04/08/2020  . Hyperglycemia 10/30/2019  . Deficiency anemia 10/30/2019  . Ventral hernia without obstruction or gangrene 10/30/2019  . Essential hypertension 01/04/2019  . Other male erectile dysfunction 10/29/2018  . Osteoarthritis of left hip 10/27/2018  . Oropharyngeal dysphagia 10/25/2017  . OSA on CPAP 12/17/2016  . BPH (benign prostatic hyperplasia) 10/07/2015  . Hyperlipidemia with target LDL less than 160 10/04/2014  . Hearing loss 10/04/2014  . Routine general medical examination at a health care facility 09/17/2011    Past Surgical History:  Procedure Laterality Date  . BACK SURGERY     C4-5-6   2 cervical    from AA  . CARPAL TUNNEL RELEASE  09/16/2011   Procedure: CARPAL TUNNEL RELEASE;  Surgeon: Floyce Stakes, MD;   Location: [facility];  Service: Neurosurgery;  Laterality: Left;  Left Carpal Tunnel Release  . CERVICAL LAMINECTOMY    . KNEE ARTHROSCOPY WITH MEDIAL MENISECTOMY  07/06/2012   Procedure: KNEE ARTHROSCOPY WITH MEDIAL MENISECTOMY;  Surgeon: Magnus Sinning, MD;  Location: [facility];  Service: Orthopedics;  Laterality: Left;  LEFT KNEE ARTHROSCOPY WITH PARTIAL MEDIAL MENISECTOMY  . KNEE SURGERY    . NECK SURGERY    . ROTATOR CUFF REPAIR     bilateral   . TOTAL HIP ARTHROPLASTY Left 01/19/2019   Procedure: TOTAL HIP ARTHROPLASTY ANTERIOR APPROACH;  Surgeon: Rod Can, MD;  Location: [facility];  Service: Orthopedics;  Laterality: Left;       Family History  Problem Relation Age of Onset  . Diabetes Brother   . Diabetes Brother   . Heart attack Mother   . Heart attack Father   . Hyperlipidemia Other   . Prostate cancer Other   . Heart disease Neg Hx   . Early death Neg Hx   . Alcohol abuse Neg Hx   . Arthritis Neg Hx   . Cancer Neg Hx   . Stroke Neg Hx   . Kidney disease Neg Hx   . Hypertension Neg Hx     Social History   Tobacco Use  . Smoking status: Never Smoker  . Smokeless tobacco: Never Used  Substance Use Topics  . Alcohol use:  Yes    Alcohol/week: 5.0 standard drinks    Types: 5 Glasses of wine per week    Comment: socially  . Drug use: No    Home Medications Prior to Admission medications   Medication Sig Start Date End Date Taking? Authorizing Provider  ondansetron (ZOFRAN ODT) 4 MG disintegrating tablet Take 1 tablet (4 mg total) by mouth every 8 (eight) hours as needed for nausea or vomiting. 08/31/20  Yes Lucrezia Starch, MD  oxyCODONE-acetaminophen (PERCOCET/ROXICET) 5-325 MG tablet Take 1 tablet by mouth every 6 (six) hours as needed for up to 3 days for severe pain. 08/31/20 09/03/20 Yes Llewyn Heap, Ellwood Dense, MD  tamsulosin (FLOMAX) 0.4 MG CAPS capsule Take 1 capsule (0.4 mg total) by mouth daily. 08/31/20  Yes Lucrezia Starch, MD  Ascorbic Acid  (VITAMIN C) 1000 MG tablet Take 2,000 mg by mouth daily.     [provider]  aspirin 81 MG chewable tablet Chew 1 tablet (81 mg total) by mouth 2 (two) times daily. 01/20/19   Swinteck, Aaron Edelman, MD  chlorpheniramine-HYDROcodone (TUSSIONEX PENNKINETIC ER) 10-8 MG/5ML SUER Take 5 mLs by mouth every 12 (twelve) hours as needed for cough. 08/20/20   Janith Lima, MD  Cholecalciferol (VITAMIN D-3) 125 MCG (5000 UT) TABS Take 5,000 Units by mouth daily.    [provider]  Cyanocobalamin (VITAMIN B-12) 2500 MCG SUBL Place 2,500 mcg under the tongue daily.    [provider]  Menaquinone-7 (VITAMIN K2 PO) Take by mouth.    [provider]  Misc Natural Products (OSTEO BI-FLEX TRIPLE STRENGTH PO) Take 1 tablet by mouth daily.    [provider]  Multiple Vitamin (MULTIVITAMIN WITH MINERALS) TABS tablet Take 1 tablet by mouth daily. One-A-Day Men's Health    [provider]  Turmeric 500 MG CAPS Take 500 mg by mouth daily.     [provider]  vitamin E 400 UNIT capsule Take 800 Units by mouth daily.    [provider]  Zinc 50 MG TABS Take 50 mg by mouth daily.    [provider]    Allergies    Patient has no known allergies.  Review of Systems   Review of Systems  Constitutional: Negative for chills and fever.  HENT: Negative for ear pain and sore throat.   Eyes: Negative for pain and visual disturbance.  Respiratory: Negative for cough and shortness of breath.   Cardiovascular: Negative for chest pain and palpitations.  Gastrointestinal: Positive for abdominal pain. Negative for vomiting.  Genitourinary: Positive for flank pain. Negative for dysuria and hematuria.  Musculoskeletal: Negative for arthralgias and back pain.  Skin: Negative for color change and rash.  Neurological: Negative for seizures and syncope.  All other systems reviewed and are negative.   Physical Exam Updated Vital Signs BP (!) 153/109    Pulse 86   Temp 98.1 F (36.7 C) (Oral)   Resp 19   SpO2 96%   Physical Exam Vitals and nursing note reviewed.  Constitutional:      Appearance: He is well-developed and well-nourished.  HENT:     Head: Normocephalic and atraumatic.  Eyes:     Conjunctiva/sclera: Conjunctivae normal.  Cardiovascular:     Rate and Rhythm: Normal rate and regular rhythm.     Heart sounds: No murmur heard.   Pulmonary:     Effort: Pulmonary effort is normal. No respiratory distress.     Breath sounds: Normal breath sounds.  Abdominal:  Palpations: Abdomen is soft.     Tenderness: There is abdominal tenderness in the left lower quadrant. There is no guarding. Negative signs include Murphy's sign and McBurney's sign.  Genitourinary:    Comments: Testicles appear normal, no tenderness to palpation, no scrotal swelling Musculoskeletal:        General: No edema.     Cervical back: Neck supple.  Skin:    General: Skin is warm and dry.  Neurological:     Mental Status: He is alert.  Psychiatric:        Mood and Affect: Mood and affect normal.     ED Results / Procedures / Treatments   Labs (all labs ordered are listed, but only abnormal results are displayed) Labs Reviewed  COMPREHENSIVE METABOLIC PANEL - Abnormal; Notable for the following components:      Result Value   Glucose, Bld 166 (*)    BUN 26 (*)    Creatinine, Ser 1.39 (*)    Calcium 10.5 (*)    ALT 89 (*)    GFR, Estimated 55 (*)    All other components within normal limits  CBC - Abnormal; Notable for the following components:   WBC 19.5 (*)    All other components within normal limits  URINALYSIS, ROUTINE W REFLEX MICROSCOPIC - Abnormal; Notable for the following components:   Color, Urine AMBER (*)    APPearance HAZY (*)    All other components within normal limits  LIPASE, BLOOD    EKG None  Radiology CT ABDOMEN PELVIS W CONTRAST  Result Date: 08/31/2020 CLINICAL DATA:  Nausea. Left abdominal pain. History  of hernia repair. EXAM: CT ABDOMEN AND PELVIS WITH CONTRAST TECHNIQUE: Multidetector CT imaging of the abdomen and pelvis was performed using the standard protocol following bolus administration of intravenous contrast. CONTRAST:  156mL OMNIPAQUE IOHEXOL 300 MG/ML  SOLN COMPARISON:  None. FINDINGS: Lower chest: Prominent patchy peripheral consolidation and ground-glass opacity throughout both lung bases. Hepatobiliary: Normal liver size. No liver mass. Normal gallbladder with no radiopaque cholelithiasis. No biliary ductal dilatation. Pancreas: Normal, with no mass or duct dilation. Spleen: Normal size. No mass. Adrenals/Urinary Tract: Normal adrenals. Obstructing 4 mm proximal left lumbar ureteral stone with mild left hydroureteronephrosis. Asymmetric left perinephric fat stranding and delayed left contrast nephrogram. Nonobstructing 2 mm interpolar right renal stone. No right hydronephrosis. Nonobstructing 2 mm upper left renal stone. Subcentimeter hypodense renal cortical lesion in the medial lower left kidney, too small to characterize, requiring no follow-up. Otherwise no renal masses. Nondistended bladder and pelvic ureters obscured by streak artifact from left hip hardware with no gross bladder abnormality. Stomach/Bowel: Normal non-distended stomach. Normal caliber small bowel with no small bowel wall thickening. Normal appendix. Normal large bowel with no diverticulosis, large bowel wall thickening or pericolonic fat stranding. Vascular/Lymphatic: Atherosclerotic nonaneurysmal abdominal aorta. Patent portal, splenic, hepatic and renal veins. Peripherally coarsely calcified 2.8 x 1.9 cm proximal splenic artery aneurysm (series 3/image 24). No pathologically enlarged lymph nodes in the abdomen or pelvis. Reproductive: Top-normal size prostate with nonspecific internal prostatic calcifications. Other: No pneumoperitoneum, ascites or focal fluid collection. Musculoskeletal: No aggressive appearing focal osseous  lesions. Left total hip arthroplasty. Moderate thoracolumbar spondylosis. IMPRESSION: 1. Obstructing 4 mm proximal left lumbar ureteral stone with mild left hydroureteronephrosis. 2. Additional bilateral nonobstructing tiny renal stones. 3. Prominent patchy peripheral consolidation and ground-glass opacity throughout both lung bases, compatible with multilobar pneumonia, suspicious for COVID-19 pneumonia. 4. Peripherally coarsely calcified 2.8 x 1.9 cm proximal splenic artery aneurysm.  Consider interventional radiology consultation. Follow-up CT angiogram of the abdomen with IV contrast recommended initially in 6-12 months. 5. Aortic Atherosclerosis (ICD10-I70.0). Electronically Signed   By: Ilona Sorrel M.D.   On: 08/31/2020 14:52    Procedures Procedures (including critical care time)  Medications Ordered in ED Medications  ondansetron (ZOFRAN-ODT) disintegrating tablet 4 mg (4 mg Oral Given 08/31/20 1201)  fentaNYL (SUBLIMAZE) injection 50 mcg (50 mcg Intravenous Given 08/31/20 1415)  iohexol (OMNIPAQUE) 300 MG/ML solution 100 mL (100 mLs Intravenous Contrast Given 08/31/20 1425)  ketorolac (TORADOL) 15 MG/ML injection 15 mg (15 mg Intravenous Given 08/31/20 1451)    ED Course  I have reviewed the triage vital signs and the nursing notes.  Pertinent labs & imaging results that were available during my care of the patient were reviewed by me and considered in my medical decision making (see chart for details).    MDM Rules/Calculators/A&P                         70 year old male presenting to ER with concern for left lower abdominal pain, left flank and groin pain.  On exam well-appearing in no distress.  Did have some tenderness in his left lower quadrant but otherwise benign exam.  Afebrile.  CT concerning for kidney stone.  Suspect this is etiology for his symptoms today.  No UTI and afebrile, pain well controlled, appropriate for outpatient management at this time.  Incidental finding on CT  scan of splenic artery aneurysm.  Discussed this finding with patient and need for repeat imaging and monitoring in the outpatient setting.  Encouraged to discuss with PCP or f/u with IR out pt.     After the discussed management above, the patient was determined to be safe for discharge.  The patient was in agreement with this plan and all questions regarding their care were answered.  ED return precautions were discussed and the patient will return to the ED with any significant worsening of condition.   Final Clinical Impression(s) / ED Diagnoses Final diagnoses:  Splenic artery aneurysm ([place])  Nephrolithiasis    Rx / DC Orders ED Discharge Orders         Ordered    tamsulosin (FLOMAX) 0.4 MG CAPS capsule  Daily        08/31/20 1505    oxyCODONE-acetaminophen (PERCOCET/ROXICET) 5-325 MG tablet  Every 6 hours PRN        08/31/20 1505    ondansetron (ZOFRAN ODT) 4 MG disintegrating tablet  Every 8 hours PRN        08/31/20 1505           Lucrezia Starch, MD 09/01/20 1660    Lucrezia Starch, MD 09/01/20 [phone]

## 2020-09-03 ENCOUNTER — Telehealth: Payer: Self-pay | Admitting: Internal Medicine

## 2020-09-03 ENCOUNTER — Other Ambulatory Visit: Payer: Self-pay | Admitting: Internal Medicine

## 2020-09-03 DIAGNOSIS — N200 Calculus of kidney: Secondary | ICD-10-CM

## 2020-09-03 MED ORDER — OXYCODONE HCL 5 MG PO TABS: 5.0000 mg | ORAL_TABLET | ORAL | 0 refills | Status: AC | PRN

## 2020-09-03 NOTE — Telephone Encounter (Signed)
Dr. [name] addressed today.

## 2020-09-03 NOTE — Telephone Encounter (Signed)
In ED Saturday, trying to pass kidney stone  Needs more pain medicine, not passed yet  Oxycodone every six hours, took last pill at 6:30am  Needs referral to a urologist to get the kidney stone passed and pain meds  [id number] [address], [place] Phone:  [phone]  Fax:  [phone]

## 2020-09-03 NOTE — Telephone Encounter (Signed)
See below- I am trying to help Garnette Czech since we are so short staffed. This could be a duplicate message and obviously disregard if that's the case.

## 2020-09-04 ENCOUNTER — Other Ambulatory Visit: Payer: Self-pay | Admitting: Urology

## 2020-09-04 ENCOUNTER — Other Ambulatory Visit (HOSPITAL_COMMUNITY)
Admission: RE | Admit: 2020-09-04 | Discharge: 2020-09-04 | Disposition: A | Discharge status: Home / Self Care | Payer: Medicare HMO | Source: Ambulatory Visit | Attending: Urology | Admitting: Urology

## 2020-09-04 DIAGNOSIS — Z01818 Encounter for other preprocedural examination: Secondary | ICD-10-CM | POA: Diagnosis present

## 2020-09-04 DIAGNOSIS — U071 COVID-19: Secondary | ICD-10-CM | POA: Diagnosis not present

## 2020-09-04 DIAGNOSIS — N201 Calculus of ureter: Secondary | ICD-10-CM

## 2020-09-04 LAB — SARS CORONAVIRUS 2 (TAT 6-24 HRS): SARS Coronavirus 2: POSITIVE — AB

## 2020-09-04 NOTE — Progress Notes (Signed)
Patient to arrive at 1030 on 09/05/2020. History and medications reviewed. Pre-procedure instructions given. NPO after MN except for clear liquids until 8:30 AM. Patient has been taking his ASA, fish oil and tumeric. Reports he has taken these medications today. [facility]) made aware and confirmed they knew this information prior to scheduling. Driver secured.

## 2020-09-05 ENCOUNTER — Telehealth: Payer: Self-pay

## 2020-09-05 ENCOUNTER — Other Ambulatory Visit: Payer: Self-pay | Admitting: Urology

## 2020-09-05 ENCOUNTER — Telehealth: Payer: Self-pay | Admitting: Internal Medicine

## 2020-09-05 DIAGNOSIS — Z9989 Dependence on other enabling machines and devices: Secondary | ICD-10-CM

## 2020-09-05 DIAGNOSIS — N201 Calculus of ureter: Secondary | ICD-10-CM

## 2020-09-05 DIAGNOSIS — I728 Aneurysm of other specified arteries: Secondary | ICD-10-CM

## 2020-09-05 DIAGNOSIS — G4733 Obstructive sleep apnea (adult) (pediatric): Secondary | ICD-10-CM

## 2020-09-05 DIAGNOSIS — I10 Essential (primary) hypertension: Secondary | ICD-10-CM

## 2020-09-05 DIAGNOSIS — D539 Nutritional anemia, unspecified: Secondary | ICD-10-CM

## 2020-09-05 DIAGNOSIS — E785 Hyperlipidemia, unspecified: Secondary | ICD-10-CM

## 2020-09-05 NOTE — Telephone Encounter (Signed)
1.Medication Requested: chlorpheniramine-HYDROcodone (TUSSIONEX PENNKINETIC ER) 10-8 MG/5ML SUER    2. Pharmacy (Name, Street, [place]): [id number] - [place], [address]  3. On Med List: yes   4. Last Visit with PCP: 8.23.21  5. Next visit date with PCP: n/a    Agent: Please be advised that RX refills may take up to 3 business days. We ask that you follow-up with your pharmacy.

## 2020-09-05 NOTE — Telephone Encounter (Signed)
Patient was on azithromycin and has finished it but is still coughing up brown mucus so he is wondering if he needs a refill for this also

## 2020-09-05 NOTE — Telephone Encounter (Signed)
pls refer him to the post-COVID clinic

## 2020-09-05 NOTE — Telephone Encounter (Signed)
Refill request for the azithromycin and Tussionex cough syrup.   Pt states that he is still coughing up brown colored mucus.   Please advise.

## 2020-09-05 NOTE — Addendum Note (Signed)
Addended by: Karle Barr on: 09/05/2020 05:12 PM   Modules accepted: Orders

## 2020-09-05 NOTE — Telephone Encounter (Signed)
Called to discuss with patient about COVID-19 symptoms and the use of one of the available treatments for those with mild to moderate Covid symptoms and at a high risk of hospitalization.  Pt appears to qualify for outpatient treatment due to co-morbid conditions and/or a member of an at-risk group in accordance with the FDA Emergency Use Authorization.    Symptom onset: 1 month ago Vaccinated: No Booster? No Immunocompromised? No Qualifiers: HTN  Unable to reach pt - Pt. States he had COVID 19 1 month ago and was tested for prescreening for a procedure and was still positive. Will call his PCP.  Ronald Franklin

## 2020-09-05 NOTE — Telephone Encounter (Signed)
Amb referral made for the COVID treatment and a message left with the post covid clinic for patient to be seen.   Pt has been contacted and informed of same.

## 2020-09-06 ENCOUNTER — Telehealth: Payer: Self-pay | Admitting: Nurse Practitioner

## 2020-09-06 ENCOUNTER — Ambulatory Visit (INDEPENDENT_AMBULATORY_CARE_PROVIDER_SITE_OTHER): Payer: Medicare HMO | Admitting: Medical

## 2020-09-06 VITALS — BP 140/92 | HR 106 | Temp 97.9°F | Resp 16 | Wt 208.0 lb

## 2020-09-06 DIAGNOSIS — J329 Chronic sinusitis, unspecified: Secondary | ICD-10-CM

## 2020-09-06 DIAGNOSIS — N200 Calculus of kidney: Secondary | ICD-10-CM

## 2020-09-06 DIAGNOSIS — I10 Essential (primary) hypertension: Secondary | ICD-10-CM

## 2020-09-06 DIAGNOSIS — Z8616 Personal history of COVID-19: Secondary | ICD-10-CM

## 2020-09-06 DIAGNOSIS — Z9989 Dependence on other enabling machines and devices: Secondary | ICD-10-CM

## 2020-09-06 DIAGNOSIS — G4733 Obstructive sleep apnea (adult) (pediatric): Secondary | ICD-10-CM

## 2020-09-06 DIAGNOSIS — J40 Bronchitis, not specified as acute or chronic: Secondary | ICD-10-CM

## 2020-09-06 MED ORDER — ALBUTEROL SULFATE HFA 108 (90 BASE) MCG/ACT IN AERS: 2.0000 | INHALATION_SPRAY | Freq: Four times a day (QID) | RESPIRATORY_TRACT | 0 refills | Status: DC | PRN

## 2020-09-06 MED ORDER — DOXYCYCLINE HYCLATE 100 MG PO TABS: 100.0000 mg | ORAL_TABLET | Freq: Two times a day (BID) | ORAL | 0 refills | Status: DC

## 2020-09-06 MED ORDER — BENZONATATE 200 MG PO CAPS: 200.0000 mg | ORAL_CAPSULE | Freq: Three times a day (TID) | ORAL | 0 refills | Status: DC | PRN

## 2020-09-06 NOTE — Telephone Encounter (Signed)
[facility] called pt verified DOB. Pt states had COVID 3 weeks ago & pt states " +COVID results 09/04/20 does not mean I has COVID". Pt says all he wants is another round of antibiotics for his continued symptoms of cough pt says is bad when he talks so he tries not to talk & productive sinus discharge.  Scheduled today 6pm at [facility]. Pt understand he may receive call from [facility] RN to discuss other treatments & tonight's appt may be canceled if RN schedules for COVID treatment. Pt agrees with this plan and had no other questions.

## 2020-09-06 NOTE — Progress Notes (Signed)
[facility]   Subjective:  Ronald Franklin is a 70 y.o. male who presents for respiratory illness.    PCP: Janith Lima, MD  Here today for cough and congestion.  He had COVID around Christmas, had a relatively mild illness and those symptoms resolved.  Current symptoms are new or in the last week and 1/2 to 2 weeks.  Cough is lingering, still has some brown mucus and congestion positive chills, no nausea vomiting diarrhea, no loss of taste or smell, no fever.  He did not feel terribly ill.  He did a visit with urgent care in [place] which prescribed prednisone which he has 1 more days, he finished erythromycin 2 days ago, is running out of Tussionex.  He does good with his water intake.  He notes that his COVID resolved with ivermectin  His main issue is that he is dealing with kidney stone.  He is supposed to have lithotripsy and procedure but due to the recent respiratory illness they pushed his procedure off for another week  Overall not doing bad but needs this to clear up.  He went to the emergency department several days ago for the kidney stone pain and waited hours before he evaluated give him any relief or evaluate him.  He attributes the elevated pulse rate today to pain  No other aggravating or relieving factors.  No other c/o.  Past Medical History:  Diagnosis Date  . Allergic rhinitis   . Arthritis   . CTS (carpal tunnel syndrome)   . Hypertension   . OSA (obstructive sleep apnea)     ROS as in subjective   Objective: BP (!) 140/92   Pulse (!) 106   Temp 97.9 F (36.6 C)   Resp 16   Wt 208 lb (94.3 kg)   SpO2 99%   BMI 28.21 kg/m   General appearance: Alert, WD/WN, no distress, mildly ill appearing, white male                             Skin: warm, no rash                           Head: no sinus tenderness                            Eyes: conjunctiva normal, corneas clear, PERRLA                        Nose: septum midline,  turbinates swollen, with erythema and mucoid discharge             Mouth/throat: MMM, tongue normal, mild pharyngeal erythema                           Neck: supple, no adenopathy, no thyromegaly, non tender                          Heart: RRR, normal S1, S2, no murmurs                         Lungs: Somewhat coarse breath sounds, no wheezes, rales, or rhonchi No extremity edema, no calf tenderness or swelling, negative Homans Upper and lower extremity pulses within normal limits  Assessment  Encounter Diagnoses  Name Primary?  . Sinobronchitis Yes  . History of COVID-19   . Essential hypertension   . OSA on CPAP   . Kidney stone on left side       Plan: He is status post COVID around Christmas time 2021.  Currently seems to have a sinobronchitis.  Begin medication as below, rest, hydrate.  As long as he is resolving within a week he can do his kidney stone procedure  General recommendations if you have respiratory symptoms: We recommend you rest, hydrate well with water and clear fluids throughout the day such as water, soup broth, ice chips, or possibly pedialyte or G2 no sugar gatorade.   You can use Tylenol over the counter for pain or fever every 4 - 6 hours You can use over the counter Delsym or mucinex DM for cough unless your provider prescribed a cough medication already. You can use over the counter Emetrol over the counter for nausea.    Consider EmergenC Immune plus vitamin pack over the counter which contains extra vitamin C, vitamin D, and zinc.  If you are having trouble breathing, if you are very weak, have high fever 103 or higher consistently despite Tylenol, or uncontrollable nausea and vomiting, then call or go to the emergency department.    Covid symptoms such as fatigue and cough can linger over 2 weeks, even after the initial fever, aches, chills, and other initial symptoms.   Divante was seen today for covid positive.  Diagnoses and all orders for  this visit:  Sinobronchitis  History of COVID-19  Essential hypertension  OSA on CPAP  Kidney stone on left side  Other orders -     doxycycline (VIBRA-TABS) 100 MG tablet; Take 1 tablet (100 mg total) by mouth 2 (two) times daily. -     albuterol (VENTOLIN HFA) 108 (90 Base) MCG/ACT inhaler; Inhale 2 puffs into the lungs every 6 (six) hours as needed for wheezing or shortness of breath. -     benzonatate (TESSALON) 200 MG capsule; Take 1 capsule (200 mg total) by mouth 3 (three) times daily as needed for cough.     Patient voiced understanding of diagnosis, recommendations, and treatment plan.  After visit summary given.

## 2020-09-09 ENCOUNTER — Telehealth: Payer: Self-pay | Admitting: Internal Medicine

## 2020-09-09 NOTE — Telephone Encounter (Signed)
Patient called and was wondering if a referral could be placed for an ENT. He said that he is having sinus issues. Please call the patient back at [phone].

## 2020-09-10 ENCOUNTER — Telehealth (INDEPENDENT_AMBULATORY_CARE_PROVIDER_SITE_OTHER): Payer: Medicare HMO | Admitting: Family

## 2020-09-10 ENCOUNTER — Ambulatory Visit (INDEPENDENT_AMBULATORY_CARE_PROVIDER_SITE_OTHER)
Admission: RE | Admit: 2020-09-10 | Discharge: 2020-09-10 | Disposition: A | Discharge status: Home / Self Care | Payer: Medicare HMO | Source: Ambulatory Visit | Attending: Family | Admitting: Family

## 2020-09-10 ENCOUNTER — Other Ambulatory Visit: Payer: Self-pay

## 2020-09-10 DIAGNOSIS — N200 Calculus of kidney: Secondary | ICD-10-CM | POA: Diagnosis not present

## 2020-09-10 DIAGNOSIS — I728 Aneurysm of other specified arteries: Secondary | ICD-10-CM | POA: Diagnosis not present

## 2020-09-10 DIAGNOSIS — R053 Chronic cough: Secondary | ICD-10-CM

## 2020-09-10 NOTE — Progress Notes (Signed)
Ronald Franklin is a 70 y.o. male with the following history as recorded in Epic[employer]:  Patient Active Problem List   Diagnosis Date Noted  . Kidney stone on left side 09/03/2020  . Splenic artery aneurysm ([place]) 08/31/2020  . Clinical diagnosis of COVID-19 08/20/2020  . Bronchitis 04/08/2020  . Hyperglycemia 10/30/2019  . Deficiency anemia 10/30/2019  . Ventral hernia without obstruction or gangrene 10/30/2019  . Essential hypertension 01/04/2019  . Other male erectile dysfunction 10/29/2018  . Osteoarthritis of left hip 10/27/2018  . Oropharyngeal dysphagia 10/25/2017  . OSA on CPAP 12/17/2016  . BPH (benign prostatic hyperplasia) 10/07/2015  . Hyperlipidemia with target LDL less than 160 10/04/2014  . Hearing loss 10/04/2014  . Routine general medical examination at a health care facility 09/17/2011    Current Outpatient Medications  Medication Sig Dispense Refill  . albuterol (VENTOLIN HFA) 108 (90 Base) MCG/ACT inhaler Inhale 2 puffs into the lungs every 6 (six) hours as needed for wheezing or shortness of breath. 18 g 0  . Ascorbic Acid (VITAMIN C) 1000 MG tablet Take 2,000 mg by mouth daily.     Marland Kitchen aspirin 81 MG chewable tablet Chew 1 tablet (81 mg total) by mouth 2 (two) times daily. 60 tablet 1  . benzonatate (TESSALON) 200 MG capsule Take 1 capsule (200 mg total) by mouth 3 (three) times daily as needed for cough. 30 capsule 0  . chlorpheniramine-HYDROcodone (TUSSIONEX PENNKINETIC ER) 10-8 MG/5ML SUER Take 5 mLs by mouth every 12 (twelve) hours as needed for cough. 140 mL 0  . Cholecalciferol (VITAMIN D-3) 125 MCG (5000 UT) TABS Take 5,000 Units by mouth daily.    . Cyanocobalamin (VITAMIN B-12) 2500 MCG SUBL Place 2,500 mcg under the tongue daily.    Marland Kitchen doxycycline (VIBRA-TABS) 100 MG tablet Take 1 tablet (100 mg total) by mouth 2 (two) times daily. 20 tablet 0  . Menaquinone-7 (VITAMIN K2 PO) Take by mouth.    . Misc Natural Products (OSTEO BI-FLEX TRIPLE STRENGTH PO) Take 1  tablet by mouth daily.    . Multiple Vitamin (MULTIVITAMIN WITH MINERALS) TABS tablet Take 1 tablet by mouth daily. One-A-Day Men's Health    . ondansetron (ZOFRAN ODT) 4 MG disintegrating tablet Take 1 tablet (4 mg total) by mouth every 8 (eight) hours as needed for nausea or vomiting. 20 tablet 0  . oxyCODONE (OXY IR/ROXICODONE) 5 MG immediate release tablet Take 1 tablet (5 mg total) by mouth every 4 (four) hours as needed for up to 7 days for severe pain. 30 tablet 0  . tamsulosin (FLOMAX) 0.4 MG CAPS capsule Take 1 capsule (0.4 mg total) by mouth daily. 30 capsule 0  . Turmeric 500 MG CAPS Take 500 mg by mouth daily.     . vitamin E 400 UNIT capsule Take 800 Units by mouth daily.    . Zinc 50 MG TABS Take 50 mg by mouth daily.     No current facility-administered medications for this visit.    Allergies: Patient has no known allergies.  Past Medical History:  Diagnosis Date  . Allergic rhinitis   . Arthritis   . CTS (carpal tunnel syndrome)   . Hypertension   . OSA (obstructive sleep apnea)     Past Surgical History:  Procedure Laterality Date  . BACK SURGERY     C4-5-6   2 cervical    from AA  . CARPAL TUNNEL RELEASE  09/16/2011   Procedure: CARPAL TUNNEL RELEASE;  Surgeon: Zigmund Daniel  Joya Salm, MD;  Location: [facility];  Service: Neurosurgery;  Laterality: Left;  Left Carpal Tunnel Release  . CERVICAL LAMINECTOMY    . KNEE ARTHROSCOPY WITH MEDIAL MENISECTOMY  07/06/2012   Procedure: KNEE ARTHROSCOPY WITH MEDIAL MENISECTOMY;  Surgeon: Magnus Sinning, MD;  Location: [facility];  Service: Orthopedics;  Laterality: Left;  LEFT KNEE ARTHROSCOPY WITH PARTIAL MEDIAL MENISECTOMY  . KNEE SURGERY    . NECK SURGERY    . ROTATOR CUFF REPAIR     bilateral   . TOTAL HIP ARTHROPLASTY Left 01/19/2019   Procedure: TOTAL HIP ARTHROPLASTY ANTERIOR APPROACH;  Surgeon: Rod Can, MD;  Location: [facility];  Service: Orthopedics;  Laterality: Left;    Family History  Problem Relation Age of Onset   . Diabetes Brother   . Diabetes Brother   . Heart attack Mother   . Heart attack Father   . Hyperlipidemia Other   . Prostate cancer Other   . Heart disease Neg Hx   . Early death Neg Hx   . Alcohol abuse Neg Hx   . Arthritis Neg Hx   . Cancer Neg Hx   . Stroke Neg Hx   . Kidney disease Neg Hx   . Hypertension Neg Hx     Social History   Tobacco Use  . Smoking status: Never Smoker  . Smokeless tobacco: Never Used  Substance Use Topics  . Alcohol use: Yes    Alcohol/week: 5.0 standard drinks    Types: 5 Glasses of wine per week    Comment: socially    Subjective:   I connected with Ronald Franklin on 09/10/20 at 11:40 AM EST by a video enabled telemedicine application and verified that I am speaking with the correct person using two identifiers.   I discussed the limitations of evaluation and management by telemedicine and the availability of in person appointments. The patient expressed understanding and agreed to proceed.  Patient is concerned about persistent cough/ congestion; had COVID at the end of December ( first time he was formally tested was 08/22/2020); was seen at post-COVID respiratory clinic on Friday, 1/21; started on Doxycycline and given Tessalon Perles; Does not remember a CXR being done;  Is in the process of being evaluated for kidney stone- notes he was originally on prednisone to help with the cough but this had to be discontinued in preparation for possible surgery to remove stone; he is going today to see urology in hopes that he is going to be able to pass the stone on his own;    Objective:  There were no vitals filed for this visit.  General: Well developed, well nourished, in no acute distress  Head: Normocephalic and atraumatic  Lungs: Respirations unlabored;  Neurologic: Alert and oriented; speech intact; face symmetrical;   Assessment:  1. Persistent cough for 3 weeks or longer   2. Splenic artery aneurysm ([place])   3. Kidney stone      Plan:  1. Update CXR today; will most likely put patient back on oral prednisone unless he has to have surgery next week- then will use an inhaled steroid; he is to see his PCP in the office in person for follow-up next week; 2. Discussed finding from CT at recent ER visit- he will discuss further with his PCP next week and discuss management at that time; 3. Keep planned appt with urology today- he will let us know the status of kidney stone so treatment for cough can be determined;  Scheduled to see Dr. [name] on 2/1 at 3:20 pm;   No follow-ups on file.  Orders Placed This Encounter  Procedures  . DG Chest 2 View    Standing Status:   Future    Standing Expiration Date:   09/10/2021    Order Specific Question:   Reason for Exam (SYMPTOM  OR DIAGNOSIS REQUIRED)    Answer:   cough x 3 weeks/ COVID + on 08/22/20    Order Specific Question:   Preferred imaging location?    Answer:   [facility]    Requested Prescriptions    No prescriptions requested or ordered in this encounter

## 2020-09-11 NOTE — Progress Notes (Signed)
Reviewed with patient; he is already on Doxycycline; he is still waiting to hear back from his urologist regarding their comfort with him being on oral steroids- no decision has yet been made on whether he is having surgery for a kidney stone next Monday; he will call back to updated our office once he talks to his urologist.

## 2020-09-12 ENCOUNTER — Telehealth: Payer: Self-pay | Admitting: Emergency Medicine

## 2020-09-12 ENCOUNTER — Other Ambulatory Visit: Payer: Self-pay | Admitting: Family

## 2020-09-12 MED ORDER — PREDNISONE 20 MG PO TABS
20.0000 mg | ORAL_TABLET | Freq: Every day | ORAL | 0 refills | Status: DC
Start: 2020-09-12 — End: 2020-09-18

## 2020-09-12 NOTE — Telephone Encounter (Signed)
Pt called and requested that you give him a call back about a medication that needs to be called in. He is unaware of the name of the medication. He was waiting on approval from another doctor since he is having a procedure done as well. Thanks.

## 2020-09-12 NOTE — Progress Notes (Signed)
Patient to arrive at 0800 on 09/16/2020. History and medications reviewed. Patient reports that he is being treated for pneumonia currently and that his urologist is aware. Pre-procedure instructions reviewed. NPO after MN Sunday except for clear liquids until 0600. Will stop ASA tomorrow. Driver secured.

## 2020-09-12 NOTE — Telephone Encounter (Signed)
Pt confirmed that procedure will be next week & that his urologist said that he can start the oral prednisone.

## 2020-09-13 NOTE — H&P (Signed)
Office Visit Report     09/04/2020   --------------------------------------------------------------------------------   Ronald Franklin  MRN: [id number]  DOB: March 04, 1951, 70 year old Male  SSN:    PRIMARY CARE:    REFERRING:  Janith Lima, MD  PROVIDER:  Franchot Gallo, M.D.  LOCATION:  [facility] [phone]     --------------------------------------------------------------------------------   CC/HPI: 70 year old male sent by Dr. Scarlette Calico at [facility] internal Medicine for evaluation and management of a left ureteral stone.  Pt notes onset of flank pain on 1.14.2022. His pain has been persistent and consistent with regard to magnitude and location. He denies any fever but has experienced nausea. Postural repositioning provides no alleviation of pain. He denies any increased urgency/frequency at this time.   This is his 1st kidney stone. Because of several reasons, he would like to have this treated urgently.     ALLERGIES: None   MEDICATIONS: Aspirin 81 mg tablet,chewable  Vitamin C  Vitamin D3  Vitamin E     GU PSH: None   NON-GU PSH: Carpal tunnel surgery, Left - 2013 Hip Arthroscopy/surgery - 01/19/2019 Knee Arthroscopy - 2013     GU PMH: None   NON-GU PMH: Sleep Apnea Squamous cell carcinoma of skin of unspecified lower limb, including hip Squamous cell carcinoma of skin, unspecified    FAMILY HISTORY: 2 daughters - Daughter 2 sons - Son Alzheimer's Disease - Father Death In The Family Father - Father Death In The Family Mother - Mother Dementia - Mother   SOCIAL HISTORY: Marital Status: Married Preferred Language: English; Ethnicity: Not Hispanic Or Latino; Race: White Current Smoking Status: Patient has never smoked.   Tobacco Use Assessment Completed: Used Tobacco in last 30 days? Has never drank.  Drinks 1 caffeinated drink per day. Patient's occupation is/was retired.    REVIEW OF SYSTEMS:    GU Review Male:    Patient reports burning/ pain with urination, get up at night to urinate, and stream starts and stops. Patient denies frequent urination, hard to postpone urination, leakage of urine, trouble starting your stream, have to strain to urinate , erection problems, and penile pain.  Gastrointestinal (Upper):   Patient reports nausea. Patient denies vomiting and indigestion/ heartburn.  Gastrointestinal (Lower):   Patient reports constipation. Patient denies diarrhea.  Constitutional:   Patient reports weight loss. Patient denies fever, night sweats, and fatigue.  Skin:   Patient denies skin rash/ lesion and itching.  Eyes:   Patient denies blurred vision and double vision.  Ears/ Nose/ Throat:   Patient reports sinus problems. Patient denies sore throat.  Hematologic/Lymphatic:   Patient denies swollen glands and easy bruising.  Cardiovascular:   Patient denies leg swelling and chest pains.  Respiratory:   Patient reports cough and shortness of breath.   Endocrine:   Patient denies excessive thirst.  Musculoskeletal:   Patient reports back pain. Patient denies joint pain.  Neurological:   Patient denies headaches and dizziness.  Psychologic:   Patient denies depression and anxiety.   VITAL SIGNS:      09/04/2020 12:31 PM  Weight 203.3 lb / 92.22 kg  Height 72 in / 182.88 cm  BP 179/117 mmHg  Pulse 121 /min  Temperature 96.8 F / 36 C  BMI 27.6 kg/m   MULTI-SYSTEM PHYSICAL EXAMINATION:    Constitutional: Well-nourished. No physical deformities. Normally developed. Good grooming.  Neck: Neck symmetrical, not swollen. Normal tracheal position.  Respiratory: No labored breathing, no use of accessory  muscles.   Skin: No paleness, no jaundice, no cyanosis. No lesion, no ulcer, no rash.  Neurologic / Psychiatric: Oriented to time, oriented to place, oriented to person. No depression, no anxiety, no agitation.  Gastrointestinal: No mass, no tenderness, no rigidity, non obese abdomen. Left lower  quadrant tenderness.  Eyes: Normal conjunctivae. Normal eyelids.  Ears, Nose, Mouth, and Throat: Left ear no scars, no lesions, no masses. Right ear no scars, no lesions, no masses. Nose no scars, no lesions, no masses. Normal hearing. Normal lips.  Musculoskeletal: Left CVA tenderness. Normal gait and station of head and neck.     Complexity of Data:  Source Of History:  Patient  Records Review:   Previous Doctor Records  Urine Test Review:   Urinalysis  X-Ray Review: C.T. Abdomen/Pelvis: Reviewed Films. Reviewed Report. Discussed With Patient.     PROCEDURES:         KUB - [id number]  A single view of the abdomen is obtained. I reviewed the KUB as well as prior CT scan images from the emergency room visit. There is a 4 x 6 mm triangular shaped calcification in the left pelvis consistent with a distal ureteral stone. I see no calcifications overlying either renal contour. Bony structures appear normal for age. Bowel gas pattern is normal.      Patient confirmed No Neulasta OnPro Device.           Urinalysis - 81003 Dipstick Dipstick Cont'd  Color: Yellow Bilirubin: Neg  Appearance: Clear Ketones: Neg  Specific Gravity: 1.025 Blood: Neg  pH: 6.0 Protein: Trace  Glucose: Neg Urobilinogen: 0.2    Nitrites: Neg    Leukocyte Esterase: Neg    Notes:            Ketoralac 60mg  - [id number], [id number] Area was prepped with alcohol. Medication was injected. Pt tolerated well.   Qty: 60 Adm. By: Providence Lanius  Unit: mg Lot No [id number]  Route: IM Exp. Date 08/17/2021  Freq: None Mfgr.:   Site: Left Buttock   ASSESSMENT:      ICD-10 Details  1 GU:   Ureteral calculus - N20.1 Undiagnosed New Problem - Significantly symptomatic moderate size left lower ureteral stone. The patient would like this treated urgently.   PLAN:           Orders X-Rays: KUB          Schedule         Document Letter(s):  Created for Patient: Clinical Summary         Notes:   1. Pt advised regarding his  ureteral stone progress/location and oriented to his therapeutic options - shockwave lithotripsy & ureteroscopy. Risks and complications of each of these were discussed. He would like to proceed urgently.   2. Pt to be scheduled for next available shockwave lithotripsy.   CC: Dr. Scarlette Calico        Next Appointment:      Next Appointment: 09/05/2020 12:30 PM    Appointment Type: Surgery     Location: [facility] [phone]    Provider: Jacalyn Lefevre, M.D.    Reason for Visit: NE/OP LEFT ESWL      * Signed by Franchot Gallo, M.D. on 09/04/20 at 5:52 PM (EST)*

## 2020-09-16 ENCOUNTER — Encounter (HOSPITAL_BASED_OUTPATIENT_CLINIC_OR_DEPARTMENT_OTHER)
Admission: RE | Disposition: A | Discharge status: Home / Self Care | Payer: Self-pay | Source: Home / Self Care | Attending: Urology

## 2020-09-16 ENCOUNTER — Encounter (HOSPITAL_BASED_OUTPATIENT_CLINIC_OR_DEPARTMENT_OTHER): Payer: Self-pay | Admitting: Urology

## 2020-09-16 ENCOUNTER — Ambulatory Visit (HOSPITAL_COMMUNITY): Payer: Medicare HMO

## 2020-09-16 ENCOUNTER — Ambulatory Visit (HOSPITAL_BASED_OUTPATIENT_CLINIC_OR_DEPARTMENT_OTHER)
Admission: RE | Admit: 2020-09-16 | Discharge: 2020-09-16 | Disposition: A | Discharge status: Home / Self Care | Payer: Medicare HMO | Attending: Urology | Admitting: Urology

## 2020-09-16 ENCOUNTER — Other Ambulatory Visit: Payer: Self-pay

## 2020-09-16 ENCOUNTER — Telehealth: Payer: Self-pay | Admitting: Internal Medicine

## 2020-09-16 DIAGNOSIS — Z85828 Personal history of other malignant neoplasm of skin: Secondary | ICD-10-CM | POA: Insufficient documentation

## 2020-09-16 DIAGNOSIS — Z7982 Long term (current) use of aspirin: Secondary | ICD-10-CM | POA: Insufficient documentation

## 2020-09-16 DIAGNOSIS — Z5309 Procedure and treatment not carried out because of other contraindication: Secondary | ICD-10-CM | POA: Diagnosis not present

## 2020-09-16 DIAGNOSIS — N201 Calculus of ureter: Secondary | ICD-10-CM | POA: Diagnosis present

## 2020-09-16 HISTORY — PX: EXTRACORPOREAL SHOCK WAVE LITHOTRIPSY: SHX1557

## 2020-09-16 SURGERY — LITHOTRIPSY, ESWL
Anesthesia: LOCAL | Laterality: Left

## 2020-09-16 MED ORDER — DIPHENHYDRAMINE HCL 25 MG PO CAPS
ORAL_CAPSULE | ORAL | Status: AC
  Filled 2020-09-16: qty 1

## 2020-09-16 MED ORDER — OXYCODONE HCL 5 MG PO TABS: 5.0000 mg | ORAL_TABLET | Freq: Three times a day (TID) | ORAL | 0 refills | Status: DC | PRN

## 2020-09-16 MED ORDER — DIPHENHYDRAMINE HCL 25 MG PO CAPS: 25.0000 mg | ORAL_CAPSULE | ORAL | Status: DC

## 2020-09-16 MED ORDER — DIPHENHYDRAMINE HCL 25 MG PO CAPS
25.0000 mg | ORAL_CAPSULE | ORAL | Status: AC
  Administered 2020-09-16: 25 mg via ORAL

## 2020-09-16 MED ORDER — CIPROFLOXACIN HCL 500 MG PO TABS
ORAL_TABLET | ORAL | Status: AC
  Filled 2020-09-16: qty 1

## 2020-09-16 MED ORDER — SODIUM CHLORIDE 0.9 % IV SOLN: INTRAVENOUS | Status: DC

## 2020-09-16 MED ORDER — DIAZEPAM 5 MG PO TABS
ORAL_TABLET | ORAL | Status: AC
  Filled 2020-09-16: qty 2

## 2020-09-16 MED ORDER — CIPROFLOXACIN HCL 500 MG PO TABS
500.0000 mg | ORAL_TABLET | ORAL | Status: AC
  Administered 2020-09-16: 500 mg via ORAL

## 2020-09-16 MED ORDER — DIAZEPAM 5 MG PO TABS
10.0000 mg | ORAL_TABLET | ORAL | Status: AC
  Administered 2020-09-16: 10 mg via ORAL

## 2020-09-16 MED ORDER — CIPROFLOXACIN HCL 500 MG PO TABS: 500.0000 mg | ORAL_TABLET | ORAL | Status: DC

## 2020-09-16 MED ORDER — DIAZEPAM 5 MG PO TABS: 10.0000 mg | ORAL_TABLET | ORAL | Status: DC

## 2020-09-16 NOTE — Interval H&P Note (Signed)
Pt noted to be in probably atrial fibrillation once he was placed on the monitor.  He is completely asymptomatic and stable hemodynamically.  Will cancel his planned ESWL today and plan to refer him to cardiology for further evaluation.  He was given instructions to go to ER if he develops symptoms.

## 2020-09-16 NOTE — H&P (View-Only) (Signed)
Cardiology Office Note:   Date:  09/17/2020  NAME:  Ronald Franklin    MRN: [id number] DOB:  1950-12-07   PCP:  Janith Lima, MD  Cardiologist:  No primary care provider on file.   Referring MD: Raynelle Bring, MD   Chief Complaint  Patient presents with  . New Patient (Initial Visit)  . Atrial Fibrillation    History of Present Illness:   Ronald Franklin is a 70 y.o. male with a hx of HTN, OSA who is being seen today for the evaluation of irregular heart rhythm at the request of Raynelle Bring, MD. Has kidney stones and was undergoing lithotripsy 09/16/2020 and noted to possibly be in atrial fibrillation and sent for evaluation.   EKG today demonstrates atrial flutter with heart rate 158.  He reports no symptoms from this.  I have no documentation from his heart monitor yesterday.  I presume he was in atrial fibrillation.  He was seen in the emergency room on 08/31/2020.  He was diagnosed with a kidney stone.  He was in sinus rhythm at that time.  Presumptively he did develop this between that time and pursuing lithotripsy.  He has no medical history.  He denies a history of high blood pressure, heart attack, stroke.  He has no medical problems.  He does have high cholesterol which she is monitoring.  He was recently diagnosed with COVID-19 pneumonia in December.  He is now getting over that.  He reports he is still getting short of breath but symptoms have resolved.  He was having cough and chest congestion.  Things are improving.  Blood pressure 120/88.  He denies any symptoms from his atrial flutter.  He is unaware of this.  He is now going to complete his doxycycline as well as prednisone for his COVID-19.  He is a never smoker.  He does not drink alcohol or use drugs.  He is retired Armed forces operational officer.  He is married and had 6 children.  Apparently his daughter recently died.  He reports no family history of heart disease.  Of note he still has a kidney stone.  He was unable to undergo  lithotripsy.  He is still in quite a bit of pain.  He is taking pain medicines for this. I did discuss his case with Dr. Raynelle Bring. They would prefer that his heart rhythm is back in normal rhythm before they proceed with any lithotripsy. Apparently procedures can be pursued without stopping anticoagulation. I did reiterate to him that he will be unable stop in the coagulation 4 weeks after a cardioversion. Dr. Alinda Money understood this.  Problem list 1.  Atrial flutter 09/17/2020 2.  Hyperlipidemia -Total cholesterol 237, HDL 46, LDL 160, triglycerides 140  Past Medical History: Past Medical History:  Diagnosis Date  . Allergic rhinitis   . Arthritis   . CTS (carpal tunnel syndrome)   . Hypertension   . OSA (obstructive sleep apnea)     Past Surgical History: Past Surgical History:  Procedure Laterality Date  . BACK SURGERY     C4-5-6   2 cervical    from AA  . CARPAL TUNNEL RELEASE  09/16/2011   Procedure: CARPAL TUNNEL RELEASE;  Surgeon: Floyce Stakes, MD;  Location: [facility];  Service: Neurosurgery;  Laterality: Left;  Left Carpal Tunnel Release  . CERVICAL LAMINECTOMY    . EXTRACORPOREAL SHOCK WAVE LITHOTRIPSY Left 09/16/2020   Procedure: EXTRACORPOREAL SHOCK WAVE LITHOTRIPSY (ESWL);  Surgeon: Raynelle Bring,  MD;  Location: [place];  Service: Urology;  Laterality: Left;  . KNEE ARTHROSCOPY WITH MEDIAL MENISECTOMY  07/06/2012   Procedure: KNEE ARTHROSCOPY WITH MEDIAL MENISECTOMY;  Surgeon: Magnus Sinning, MD;  Location: [facility];  Service: Orthopedics;  Laterality: Left;  LEFT KNEE ARTHROSCOPY WITH PARTIAL MEDIAL MENISECTOMY  . KNEE SURGERY    . NECK SURGERY    . ROTATOR CUFF REPAIR     bilateral   . TOTAL HIP ARTHROPLASTY Left 01/19/2019   Procedure: TOTAL HIP ARTHROPLASTY ANTERIOR APPROACH;  Surgeon: Rod Can, MD;  Location: [facility];  Service: Orthopedics;  Laterality: Left;    Current Medications: Current Meds  Medication Sig  . albuterol  (VENTOLIN HFA) 108 (90 Base) MCG/ACT inhaler Inhale 2 puffs into the lungs every 6 (six) hours as needed for wheezing or shortness of breath.  Marland Kitchen apixaban (ELIQUIS) 5 MG TABS tablet Take 1 tablet (5 mg total) by mouth 2 (two) times daily.  . Ascorbic Acid (VITAMIN C) 1000 MG tablet Take 2,000 mg by mouth daily.  . benzonatate (TESSALON) 200 MG capsule Take 1 capsule (200 mg total) by mouth 3 (three) times daily as needed for cough.  . Cholecalciferol (VITAMIN D-3) 125 MCG (5000 UT) TABS Take 5,000 Units by mouth daily.  . Cyanocobalamin (VITAMIN B-12) 2500 MCG SUBL Place 2,500 mcg under the tongue daily.  Marland Kitchen diltiazem (CARDIZEM CD) 120 MG 24 hr capsule Take 1 capsule (120 mg total) by mouth daily.  Marland Kitchen doxycycline (VIBRA-TABS) 100 MG tablet Take 1 tablet (100 mg total) by mouth 2 (two) times daily.  . Menaquinone-7 (VITAMIN K2 PO) Take by mouth.  . Misc Natural Products (OSTEO BI-FLEX TRIPLE STRENGTH PO) Take 1 tablet by mouth daily.  . Multiple Vitamin (MULTIVITAMIN WITH MINERALS) TABS tablet Take 1 tablet by mouth daily. One-A-Day Men's Health  . ondansetron (ZOFRAN ODT) 4 MG disintegrating tablet Take 1 tablet (4 mg total) by mouth every 8 (eight) hours as needed for nausea or vomiting.  Marland Kitchen oxyCODONE (ROXICODONE) 5 MG immediate release tablet Take 1 tablet (5 mg total) by mouth every 8 (eight) hours as needed.  . predniSONE (DELTASONE) 20 MG tablet Take 1 tablet (20 mg total) by mouth daily with breakfast.  . tamsulosin (FLOMAX) 0.4 MG CAPS capsule Take 1 capsule (0.4 mg total) by mouth daily.  . Turmeric 500 MG CAPS Take 500 mg by mouth daily.   . vitamin E 400 UNIT capsule Take 800 Units by mouth daily.  . Zinc 50 MG TABS Take 50 mg by mouth daily.  . [DISCONTINUED] aspirin 81 MG chewable tablet Chew 1 tablet (81 mg total) by mouth 2 (two) times daily.     Allergies:    Patient has no known allergies.   Social History: Social History   Socioeconomic History  . Marital status: Married     Spouse name: Not on file  . Number of children: 6  . Years of education: BS  . Highest education level: Not on file  Occupational History  . Occupation: Retired Special educational needs teacher  Tobacco Use  . Smoking status: Never Smoker  . Smokeless tobacco: Never Used  Substance and Sexual Activity  . Alcohol use: Yes    Alcohol/week: 5.0 standard drinks    Types: 5 Glasses of wine per week    Comment: socially  . Drug use: No  . Sexual activity: Yes    Birth control/protection: None  Other Topics Concern  . 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 Married. Has children.   Drinks 1 cup of coffee a day    Social Determinants of Radio broadcast assistant Strain: Not on file  Food Insecurity: Not on file  Transportation Needs: Not on file  Physical Activity: Not on file  Stress: Not on file  Social Connections: Not on file     Family History: The patient's family history includes Diabetes in his brother and brother; Hyperlipidemia in an other family member; Prostate cancer in an other family member. There is no history of Heart disease, Early death, Alcohol abuse, Arthritis, Cancer, Stroke, Kidney disease, or Hypertension.  ROS:   All other ROS reviewed and negative. Pertinent positives noted in the HPI.     EKGs/Labs/Other Studies Reviewed:   The following studies were personally reviewed by me today:  EKG:  EKG is ordered today.  The ekg ordered today demonstrates atrial flutter with 2-1 block heart rate 158, and was personally reviewed by me.   Recent Labs: 10/30/2019: TSH 2.66 08/31/2020: ALT 89; BUN 26; Creatinine, Ser 1.39; Hemoglobin 14.8; Platelets 336; Potassium 4.5; Sodium 137   Recent Lipid Panel    Component Value Date/Time   CHOL 234 (H) 10/30/2019 1049   TRIG 140.0 10/30/2019 1049   HDL 46.10 10/30/2019 1049   CHOLHDL 5 10/30/2019 1049   VLDL 28.0 10/30/2019 1049    LDLCALC 160 (H) 10/30/2019 1049   LDLDIRECT 206.6 08/04/2013 1003    Physical Exam:   VS:  BP 120/88 (BP Location: Left Arm, Patient Position: Sitting, Cuff Size: Normal)   Pulse (!) 158   Ht 6' (1.829 m)   Wt 203 lb (92.1 kg)   BMI 27.53 kg/m    Wt Readings from Last 3 Encounters:  09/17/20 203 lb (92.1 kg)  09/16/20 199 lb (90.3 kg)  09/06/20 208 lb (94.3 kg)    General: Well nourished, well developed, in no acute distress Head: Atraumatic, normal size  Eyes: PEERLA, EOMI  Neck: Supple, no JVD Endocrine: No thryomegaly Cardiac: Normal S1, S2; tachycardia noted, regular rhythm Lungs: Clear to auscultation bilaterally, no wheezing, rhonchi or rales  Abd: Soft, nontender, no hepatomegaly  Ext: No edema, pulses 2+ Musculoskeletal: No deformities, BUE and BLE strength normal and equal Skin: Warm and dry, no rashes   Neuro: Alert and oriented to person, place, time, and situation, CNII-XII grossly intact, no focal deficits  Psych: Normal mood and affect   ASSESSMENT:   Ronald Franklin is a 70 y.o. male who presents for the following: 1. Irregular heart beat   2. Typical atrial flutter (HCC)     PLAN:   1. Irregular heart beat 2. Typical atrial flutter (HCC) -in atrial flutter. Likely triggered by covid vs pain from kidney stone. I discussed this case with Dr. Wynetta Emery board my phone. They would prefer he be back in normal rhythm before pursuing lithotripsy. We will plan to proceed with TEE/cardioversion on Friday. He was given Eliquis and instructed to start taking this. He will receive 2 days dosing before this. He also will need a Covid test. We also will start him on Cardizem XR 120 mg daily to help with rate control. -CHADSVASC=1 (age). He will only merit short-term anticoagulation for what I can tell. We will discuss extended monitoring when I see him back. -He will need CBC, BMP, TSH today. He will stop aspirin. I will  see him back in roughly 2 to 3 weeks after his  cardioversion.  Shared Decision Making/Informed Consent The risks [stroke, cardiac arrhythmias rarely resulting in the need for a temporary or permanent pacemaker, skin irritation or burns, esophageal damage, perforation (1:10,000 risk), bleeding, pharyngeal hematoma as well as other potential complications associated with conscious sedation including aspiration, arrhythmia, respiratory failure and death], benefits (treatment guidance, restoration of normal sinus rhythm, diagnostic support) and alternatives of a transesophageal echocardiogram guided cardioversion were discussed in detail with Ronald Franklin and he is willing to proceed.  Disposition: Return in about 3 weeks (around 10/08/2020).  Medication Adjustments/Labs and Tests Ordered: Current medicines are reviewed at length with the patient today.  Concerns regarding medicines are outlined above.  Orders Placed This Encounter  Procedures  . CBC  . Basic metabolic panel  . TSH  . EKG 12-Lead   Meds ordered this encounter  Medications  . diltiazem (CARDIZEM CD) 120 MG 24 hr capsule    Sig: Take 1 capsule (120 mg total) by mouth daily.    Dispense:  90 capsule    Refill:  3  . apixaban (ELIQUIS) 5 MG TABS tablet    Sig: Take 1 tablet (5 mg total) by mouth 2 (two) times daily.    Dispense:  60 tablet    Refill:  2    Patient Instructions  Medication Instructions:  Stop Aspirin Start Eliquis 5 mg twice daily  Start Diltiazem 120 mg daily   *If you need a refill on your cardiac medications before your next appointment, please call your pharmacy*   Lab Work: CBC, BMET, TSH today  COVID TESTING: 02/02- 8:55 AM- [address], [place]   If you have labs (blood work) drawn today and your tests are completely normal, you will receive your results only by: Marland Kitchen MyChart Message (if you have MyChart) OR . A paper copy in the mail If you have any lab test that is abnormal or we need to change your treatment, we will call you to  review the results.  Follow-Up: At [facility], you and your health needs are our priority.  As part of our continuing mission to provide you with exceptional heart care, we have created designated Provider Care Teams.  These Care Teams include your primary Cardiologist (physician) and Advanced Practice Providers (APPs -  Physician Assistants and Nurse Practitioners) who all work together to provide you with the care you need, when you need it.  We recommend signing up for the patient portal called "MyChart".  Sign up information is provided on this After Visit Summary.  MyChart is used to connect with patients for Virtual Visits (Telemedicine).  Patients are able to view lab/test results, encounter notes, upcoming appointments, etc.  Non-urgent messages can be sent to your provider as well.   To learn more about what you can do with MyChart, go to [email or web address].    Your next appointment:   3 weeks in office with Dr.O'Neal.    Instructions:   You are scheduled for a TEE/Cardioversion/TEE Cardioversion on Friday 09/20/2020 with Dr. Sallyanne Kuster.  Please arrive at the [facility] (Main Entrance A) at [facility]: [address] [place], [place] [address] at 9:00 AM. (1 hour prior to procedure unless lab work is needed; if lab work is needed arrive 1.5 hours ahead)  DIET: Nothing to eat or drink after midnight except a sip of water with medications (see medication instructions below)  Medication Instructions:  Continue your anticoagulant:  Eliquis You will need to continue your anticoagulant after your procedure until you  are told by your  Provider that it is safe to stop   Labs: done today   You must have a responsible person to drive you home and stay in the waiting area during your procedure. Failure to do so could result in cancellation.  Bring your insurance cards.  *Special Note: Every effort is made to have your procedure done on time. Occasionally there are  emergencies that occur at the hospital that may cause delays. Please be patient if a delay does occur.         Signed, Addison Naegeli. Audie Box, [place]  [address] [place], [place] [address] [phone]  09/17/2020 5:14 PM

## 2020-09-16 NOTE — Telephone Encounter (Signed)
Patient called and said that he was supposed to have a lithotripsy done today but when laying down for the procedure they told him " his heart was not right" the patient said that they told him he had A fib  they canceled the procedure and told him that he needed to see a cardiologist and was wondering if we had received any notes on this.

## 2020-09-16 NOTE — Interval H&P Note (Signed)
History and Physical Interval Note:  09/16/2020 9:52 AM  Ronald Franklin  has presented today for surgery, with the diagnosis of URETERAL STONE.  The various methods of treatment have been discussed with the patient and family. After consideration of risks, benefits and other options for treatment, the patient has consented to  Procedure(s): EXTRACORPOREAL SHOCK WAVE LITHOTRIPSY (ESWL) (Left) as a surgical intervention.  The patient's history has been reviewed, patient examined, no change in status, stable for surgery.  I have reviewed the patient's chart and labs.  Questions were answered to the patient's satisfaction.     [employer]

## 2020-09-16 NOTE — Discharge Instructions (Signed)
1. You should strain your urine and collect all fragments and bring them to your follow up appointment.  °2. You should take your pain medication as needed.  Please call if your pain is severe to the point that it is not controlled with your pain medication. °3. You should call if you develop fever > 101 or persistent nausea or vomiting. °4. Your doctor may prescribe tamsulosin to take to help facilitate stone passage. °

## 2020-09-16 NOTE — Progress Notes (Addendum)
Cardiology Office Note:   Date:  09/17/2020  NAME:  Ronald Franklin    MRN: [id number] DOB:  1950-12-07   PCP:  Janith Lima, MD  Cardiologist:  No primary care provider on file.   Referring MD: Raynelle Bring, MD   Chief Complaint  Patient presents with  . New Patient (Initial Visit)  . Atrial Fibrillation    History of Present Illness:   Ronald Franklin is a 70 y.o. male with a hx of HTN, OSA who is being seen today for the evaluation of irregular heart rhythm at the request of Raynelle Bring, MD. Has kidney stones and was undergoing lithotripsy 09/16/2020 and noted to possibly be in atrial fibrillation and sent for evaluation.   EKG today demonstrates atrial flutter with heart rate 158.  He reports no symptoms from this.  I have no documentation from his heart monitor yesterday.  I presume he was in atrial fibrillation.  He was seen in the emergency room on 08/31/2020.  He was diagnosed with a kidney stone.  He was in sinus rhythm at that time.  Presumptively he did develop this between that time and pursuing lithotripsy.  He has no medical history.  He denies a history of high blood pressure, heart attack, stroke.  He has no medical problems.  He does have high cholesterol which she is monitoring.  He was recently diagnosed with COVID-19 pneumonia in December.  He is now getting over that.  He reports he is still getting short of breath but symptoms have resolved.  He was having cough and chest congestion.  Things are improving.  Blood pressure 120/88.  He denies any symptoms from his atrial flutter.  He is unaware of this.  He is now going to complete his doxycycline as well as prednisone for his COVID-19.  He is a never smoker.  He does not drink alcohol or use drugs.  He is retired Armed forces operational officer.  He is married and had 6 children.  Apparently his daughter recently died.  He reports no family history of heart disease.  Of note he still has a kidney stone.  He was unable to undergo  lithotripsy.  He is still in quite a bit of pain.  He is taking pain medicines for this. I did discuss his case with Dr. Raynelle Bring. They would prefer that his heart rhythm is back in normal rhythm before they proceed with any lithotripsy. Apparently procedures can be pursued without stopping anticoagulation. I did reiterate to him that he will be unable stop in the coagulation 4 weeks after a cardioversion. Dr. Alinda Money understood this.  Problem list 1.  Atrial flutter 09/17/2020 2.  Hyperlipidemia -Total cholesterol 237, HDL 46, LDL 160, triglycerides 140  Past Medical History: Past Medical History:  Diagnosis Date  . Allergic rhinitis   . Arthritis   . CTS (carpal tunnel syndrome)   . Hypertension   . OSA (obstructive sleep apnea)     Past Surgical History: Past Surgical History:  Procedure Laterality Date  . BACK SURGERY     C4-5-6   2 cervical    from [facility]  . CARPAL TUNNEL RELEASE  09/16/2011   Procedure: CARPAL TUNNEL RELEASE;  Surgeon: Floyce Stakes, MD;  Location: [facility];  Service: Neurosurgery;  Laterality: Left;  Left Carpal Tunnel Release  . CERVICAL LAMINECTOMY    . EXTRACORPOREAL SHOCK WAVE LITHOTRIPSY Left 09/16/2020   Procedure: EXTRACORPOREAL SHOCK WAVE LITHOTRIPSY (ESWL);  Surgeon: Raynelle Bring,  MD;  Location: [facility];  Service: Urology;  Laterality: Left;  . KNEE ARTHROSCOPY WITH MEDIAL MENISECTOMY  07/06/2012   Procedure: KNEE ARTHROSCOPY WITH MEDIAL MENISECTOMY;  Surgeon: Magnus Sinning, MD;  Location: [facility];  Service: Orthopedics;  Laterality: Left;  LEFT KNEE ARTHROSCOPY WITH PARTIAL MEDIAL MENISECTOMY  . KNEE SURGERY    . NECK SURGERY    . ROTATOR CUFF REPAIR     bilateral   . TOTAL HIP ARTHROPLASTY Left 01/19/2019   Procedure: TOTAL HIP ARTHROPLASTY ANTERIOR APPROACH;  Surgeon: Rod Can, MD;  Location: [facility];  Service: Orthopedics;  Laterality: Left;    Current Medications: Current Meds  Medication Sig  . albuterol  (VENTOLIN HFA) 108 (90 Base) MCG/ACT inhaler Inhale 2 puffs into the lungs every 6 (six) hours as needed for wheezing or shortness of breath.  Marland Kitchen apixaban (ELIQUIS) 5 MG TABS tablet Take 1 tablet (5 mg total) by mouth 2 (two) times daily.  . Ascorbic Acid (VITAMIN C) 1000 MG tablet Take 2,000 mg by mouth daily.  . benzonatate (TESSALON) 200 MG capsule Take 1 capsule (200 mg total) by mouth 3 (three) times daily as needed for cough.  . Cholecalciferol (VITAMIN D-3) 125 MCG (5000 UT) TABS Take 5,000 Units by mouth daily.  . Cyanocobalamin (VITAMIN B-12) 2500 MCG SUBL Place 2,500 mcg under the tongue daily.  Marland Kitchen diltiazem (CARDIZEM CD) 120 MG 24 hr capsule Take 1 capsule (120 mg total) by mouth daily.  Marland Kitchen doxycycline (VIBRA-TABS) 100 MG tablet Take 1 tablet (100 mg total) by mouth 2 (two) times daily.  . Menaquinone-7 (VITAMIN K2 PO) Take by mouth.  . Misc Natural Products (OSTEO BI-FLEX TRIPLE STRENGTH PO) Take 1 tablet by mouth daily.  . Multiple Vitamin (MULTIVITAMIN WITH MINERALS) TABS tablet Take 1 tablet by mouth daily. One-A-Day Men's Health  . ondansetron (ZOFRAN ODT) 4 MG disintegrating tablet Take 1 tablet (4 mg total) by mouth every 8 (eight) hours as needed for nausea or vomiting.  Marland Kitchen oxyCODONE (ROXICODONE) 5 MG immediate release tablet Take 1 tablet (5 mg total) by mouth every 8 (eight) hours as needed.  . predniSONE (DELTASONE) 20 MG tablet Take 1 tablet (20 mg total) by mouth daily with breakfast.  . tamsulosin (FLOMAX) 0.4 MG CAPS capsule Take 1 capsule (0.4 mg total) by mouth daily.  . Turmeric 500 MG CAPS Take 500 mg by mouth daily.   . vitamin E 400 UNIT capsule Take 800 Units by mouth daily.  . Zinc 50 MG TABS Take 50 mg by mouth daily.  . [DISCONTINUED] aspirin 81 MG chewable tablet Chew 1 tablet (81 mg total) by mouth 2 (two) times daily.     Allergies:    Patient has no known allergies.   Social History: Social History   Socioeconomic History  . Marital status: Married     Spouse name: Not on file  . Number of children: 6  . Years of education: BS  . Highest education level: Not on file  Occupational History  . Occupation: Retired Special educational needs teacher  Tobacco Use  . Smoking status: Never Smoker  . Smokeless tobacco: Never Used  Substance and Sexual Activity  . Alcohol use: Yes    Alcohol/week: 5.0 standard drinks    Types: 5 Glasses of wine per week    Comment: socially  . Drug use: No  . Sexual activity: Yes    Birth control/protection: None  Other Topics Concern  . 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 Married. Has children.   Drinks 1 cup of coffee a day    Social Determinants of Radio broadcast assistant Strain: Not on file  Food Insecurity: Not on file  Transportation Needs: Not on file  Physical Activity: Not on file  Stress: Not on file  Social Connections: Not on file     Family History: The patient's family history includes Diabetes in his brother and brother; Hyperlipidemia in an other family member; Prostate cancer in an other family member. There is no history of Heart disease, Early death, Alcohol abuse, Arthritis, Cancer, Stroke, Kidney disease, or Hypertension.  ROS:   All other ROS reviewed and negative. Pertinent positives noted in the HPI.     EKGs/Labs/Other Studies Reviewed:   The following studies were personally reviewed by me today:  EKG:  EKG is ordered today.  The ekg ordered today demonstrates atrial flutter with 2-1 block heart rate 158, and was personally reviewed by me.   Recent Labs: 10/30/2019: TSH 2.66 08/31/2020: ALT 89; BUN 26; Creatinine, Ser 1.39; Hemoglobin 14.8; Platelets 336; Potassium 4.5; Sodium 137   Recent Lipid Panel    Component Value Date/Time   CHOL 234 (H) 10/30/2019 1049   TRIG 140.0 10/30/2019 1049   HDL 46.10 10/30/2019 1049   CHOLHDL 5 10/30/2019 1049   VLDL 28.0 10/30/2019 1049    LDLCALC 160 (H) 10/30/2019 1049   LDLDIRECT 206.6 08/04/2013 1003    Physical Exam:   VS:  BP 120/88 (BP Location: Left Arm, Patient Position: Sitting, Cuff Size: Normal)   Pulse (!) 158   Ht 6' (1.829 m)   Wt 203 lb (92.1 kg)   BMI 27.53 kg/m    Wt Readings from Last 3 Encounters:  09/17/20 203 lb (92.1 kg)  09/16/20 199 lb (90.3 kg)  09/06/20 208 lb (94.3 kg)    General: Well nourished, well developed, in no acute distress Head: Atraumatic, normal size  Eyes: PEERLA, EOMI  Neck: Supple, no JVD Endocrine: No thryomegaly Cardiac: Normal S1, S2; tachycardia noted, regular rhythm Lungs: Clear to auscultation bilaterally, no wheezing, rhonchi or rales  Abd: Soft, nontender, no hepatomegaly  Ext: No edema, pulses 2+ Musculoskeletal: No deformities, BUE and BLE strength normal and equal Skin: Warm and dry, no rashes   Neuro: Alert and oriented to person, place, time, and situation, CNII-XII grossly intact, no focal deficits  Psych: Normal mood and affect   ASSESSMENT:   Ronald Franklin is a 70 y.o. male who presents for the following: 1. Irregular heart beat   2. Typical atrial flutter (HCC)     PLAN:   1. Irregular heart beat 2. Typical atrial flutter (HCC) -in atrial flutter. Likely triggered by covid vs pain from kidney stone. I discussed this case with Dr. Wynetta Emery board my phone. They would prefer he be back in normal rhythm before pursuing lithotripsy. We will plan to proceed with TEE/cardioversion on Friday. He was given Eliquis and instructed to start taking this. He will receive 2 days dosing before this. He also will need a Covid test. We also will start him on Cardizem XR 120 mg daily to help with rate control. -CHADSVASC=1 (age). He will only merit short-term anticoagulation for what I can tell. We will discuss extended monitoring when I see him back. -He will need CBC, BMP, TSH today. He will stop aspirin. I will  see him back in roughly 2 to 3 weeks after his  cardioversion.  Shared Decision Making/Informed Consent The risks [stroke, cardiac arrhythmias rarely resulting in the need for a temporary or permanent pacemaker, skin irritation or burns, esophageal damage, perforation (1:10,000 risk), bleeding, pharyngeal hematoma as well as other potential complications associated with conscious sedation including aspiration, arrhythmia, respiratory failure and death], benefits (treatment guidance, restoration of normal sinus rhythm, diagnostic support) and alternatives of a transesophageal echocardiogram guided cardioversion were discussed in detail with Mr. Heydt and he is willing to proceed.  Disposition: Return in about 3 weeks (around 10/08/2020).  Medication Adjustments/Labs and Tests Ordered: Current medicines are reviewed at length with the patient today.  Concerns regarding medicines are outlined above.  Orders Placed This Encounter  Procedures  . CBC  . Basic metabolic panel  . TSH  . EKG 12-Lead   Meds ordered this encounter  Medications  . diltiazem (CARDIZEM CD) 120 MG 24 hr capsule    Sig: Take 1 capsule (120 mg total) by mouth daily.    Dispense:  90 capsule    Refill:  3  . apixaban (ELIQUIS) 5 MG TABS tablet    Sig: Take 1 tablet (5 mg total) by mouth 2 (two) times daily.    Dispense:  60 tablet    Refill:  2    Patient Instructions  Medication Instructions:  Stop Aspirin Start Eliquis 5 mg twice daily  Start Diltiazem 120 mg daily   *If you need a refill on your cardiac medications before your next appointment, please call your pharmacy*   Lab Work: CBC, BMET, TSH today  COVID TESTING: 02/02- 8:55 AM- [address], [place]   If you have labs (blood work) drawn today and your tests are completely normal, you will receive your results only by: Marland Kitchen MyChart Message (if you have MyChart) OR . A paper copy in the mail If you have any lab test that is abnormal or we need to change your treatment, we will call you to  review the results.  Follow-Up: At [facility], you and your health needs are our priority.  As part of our continuing mission to provide you with exceptional heart care, we have created designated Provider Care Teams.  These Care Teams include your primary Cardiologist (physician) and Advanced Practice Providers (APPs -  Physician Assistants and Nurse Practitioners) who all work together to provide you with the care you need, when you need it.  We recommend signing up for the patient portal called "MyChart".  Sign up information is provided on this After Visit Summary.  MyChart is used to connect with patients for Virtual Visits (Telemedicine).  Patients are able to view lab/test results, encounter notes, upcoming appointments, etc.  Non-urgent messages can be sent to your provider as well.   To learn more about what you can do with MyChart, go to [email or web address].    Your next appointment:   3 weeks in office with Dr.O'Neal.    Instructions:   You are scheduled for a TEE/Cardioversion/TEE Cardioversion on Friday 09/20/2020 with Dr. Sallyanne Kuster.  Please arrive at the [facility] (Main Entrance A) at [facility]: [address] [place], [place] [address] at 9:00 AM. (1 hour prior to procedure unless lab work is needed; if lab work is needed arrive 1.5 hours ahead)  DIET: Nothing to eat or drink after midnight except a sip of water with medications (see medication instructions below)  Medication Instructions:  Continue your anticoagulant:  Eliquis You will need to continue your anticoagulant after your procedure until you  are told by your  Provider that it is safe to stop   Labs: done today   You must have a responsible person to drive you home and stay in the waiting area during your procedure. Failure to do so could result in cancellation.  Bring your insurance cards.  *Special Note: Every effort is made to have your procedure done on time. Occasionally there are  emergencies that occur at the hospital that may cause delays. Please be patient if a delay does occur.         Signed, Addison Naegeli. Audie Box, [place]  [address], [place], [place] [address] [phone]  09/17/2020 5:14 PM

## 2020-09-17 ENCOUNTER — Ambulatory Visit: Payer: Medicare HMO | Admitting: Internal Medicine

## 2020-09-17 ENCOUNTER — Encounter (HOSPITAL_BASED_OUTPATIENT_CLINIC_OR_DEPARTMENT_OTHER): Payer: Self-pay | Admitting: Urology

## 2020-09-17 ENCOUNTER — Ambulatory Visit (INDEPENDENT_AMBULATORY_CARE_PROVIDER_SITE_OTHER): Payer: Medicare HMO | Admitting: Cardiovascular Disease

## 2020-09-17 VITALS — BP 120/88 | HR 158 | Ht 72.0 in | Wt 203.0 lb

## 2020-09-17 DIAGNOSIS — I483 Typical atrial flutter: Secondary | ICD-10-CM | POA: Diagnosis not present

## 2020-09-17 DIAGNOSIS — I499 Cardiac arrhythmia, unspecified: Secondary | ICD-10-CM | POA: Diagnosis not present

## 2020-09-17 MED ORDER — DILTIAZEM HCL ER COATED BEADS 120 MG PO CP24: 120.0000 mg | ORAL_CAPSULE | Freq: Every day | ORAL | 3 refills | Status: DC

## 2020-09-17 MED ORDER — APIXABAN 5 MG PO TABS: 5.0000 mg | ORAL_TABLET | Freq: Two times a day (BID) | ORAL | 2 refills | Status: DC

## 2020-09-17 NOTE — Patient Instructions (Addendum)
Medication Instructions:  Stop Aspirin Start Eliquis 5 mg twice daily  Start Diltiazem 120 mg daily   *If you need a refill on your cardiac medications before your next appointment, please call your pharmacy*   Lab Work: CBC, BMET, TSH today  COVID TESTING: 02/02- 8:55 AM- [place] [address], [place]   If you have labs (blood work) drawn today and your tests are completely normal, you will receive your results only by: Marland Kitchen MyChart Message (if you have MyChart) OR . A paper copy in the mail If you have any lab test that is abnormal or we need to change your treatment, we will call you to review the results.  Follow-Up: At [facility], you and your health needs are our priority.  As part of our continuing mission to provide you with exceptional heart care, we have created designated Provider Care Teams.  These Care Teams include your primary Cardiologist (physician) and Advanced Practice Providers (APPs -  Physician Assistants and Nurse Practitioners) who all work together to provide you with the care you need, when you need it.  We recommend signing up for the patient portal called "MyChart".  Sign up information is provided on this After Visit Summary.  MyChart is used to connect with patients for Virtual Visits (Telemedicine).  Patients are able to view lab/test results, encounter notes, upcoming appointments, etc.  Non-urgent messages can be sent to your provider as well.   To learn more about what you can do with MyChart, go to [email or web address].    Your next appointment:   3 weeks in office with Dr.O'Neal.    Instructions:   You are scheduled for a TEE/Cardioversion/TEE Cardioversion on Friday 09/20/2020 with Dr. Sallyanne Kuster.  Please arrive at the [facility] (Main Entrance A) at [facility]: [address] [place], [place] [address] at 9:00 AM. (1 hour prior to procedure unless lab work is needed; if lab work is needed arrive 1.5 hours ahead)  DIET: Nothing to  eat or drink after midnight except a sip of water with medications (see medication instructions below)  Medication Instructions:  Continue your anticoagulant: Eliquis You will need to continue your anticoagulant after your procedure until you  are told by your  Provider that it is safe to stop   Labs: done today   You must have a responsible person to drive you home and stay in the waiting area during your procedure. Failure to do so could result in cancellation.  Bring your insurance cards.  *Special Note: Every effort is made to have your procedure done on time. Occasionally there are emergencies that occur at the hospital that may cause delays. Please be patient if a delay does occur.

## 2020-09-17 NOTE — Telephone Encounter (Signed)
Called pt, LVM stating that no notes have been received.

## 2020-09-18 ENCOUNTER — Ambulatory Visit (INDEPENDENT_AMBULATORY_CARE_PROVIDER_SITE_OTHER): Payer: Medicare HMO

## 2020-09-18 ENCOUNTER — Inpatient Hospital Stay (HOSPITAL_COMMUNITY)
Admission: RE | Admit: 2020-09-18 | Discharge: 2020-09-18 | Disposition: A | Discharge status: Home / Self Care | Payer: Medicare HMO | Source: Ambulatory Visit

## 2020-09-18 ENCOUNTER — Ambulatory Visit (INDEPENDENT_AMBULATORY_CARE_PROVIDER_SITE_OTHER): Payer: Medicare HMO | Admitting: Internal Medicine

## 2020-09-18 ENCOUNTER — Other Ambulatory Visit: Payer: Self-pay

## 2020-09-18 ENCOUNTER — Encounter: Payer: Self-pay | Admitting: Internal Medicine

## 2020-09-18 ENCOUNTER — Telehealth: Payer: Self-pay | Admitting: Cardiovascular Disease

## 2020-09-18 VITALS — BP 116/78 | HR 67 | Temp 98.4°F | Ht 72.0 in | Wt 205.0 lb

## 2020-09-18 DIAGNOSIS — R058 Other specified cough: Secondary | ICD-10-CM

## 2020-09-18 DIAGNOSIS — R000 Tachycardia, unspecified: Secondary | ICD-10-CM | POA: Diagnosis not present

## 2020-09-18 DIAGNOSIS — E785 Hyperlipidemia, unspecified: Secondary | ICD-10-CM

## 2020-09-18 DIAGNOSIS — R739 Hyperglycemia, unspecified: Secondary | ICD-10-CM

## 2020-09-18 DIAGNOSIS — U071 COVID-19: Secondary | ICD-10-CM

## 2020-09-18 DIAGNOSIS — E118 Type 2 diabetes mellitus with unspecified complications: Secondary | ICD-10-CM

## 2020-09-18 DIAGNOSIS — N200 Calculus of kidney: Secondary | ICD-10-CM

## 2020-09-18 LAB — BASIC METABOLIC PANEL
BUN/Creatinine Ratio: 21 (ref 10–24)
BUN: 21 mg/dL (ref 8–27)
CO2: 23 mmol/L (ref 20–29)
Calcium: 9.8 mg/dL (ref 8.6–10.2)
Chloride: 101 mmol/L (ref 96–106)
Creatinine, Ser: 1.02 mg/dL (ref 0.76–1.27)
GFR calc Af Amer: 86 mL/min/{1.73_m2} (ref >59)
GFR calc non Af Amer: 74 mL/min/{1.73_m2} (ref >59)
Glucose: 263 mg/dL — ABNORMAL HIGH (ref 65–99)
Potassium: 5.2 mmol/L (ref 3.5–5.2)
Sodium: 140 mmol/L (ref 134–144)

## 2020-09-18 LAB — CBC
Hematocrit: 38 % (ref 37.5–51.0)
Hemoglobin: 12.9 g/dL — ABNORMAL LOW (ref 13.0–17.7)
MCH: 30.7 pg (ref 26.6–33.0)
MCHC: 33.9 g/dL (ref 31.5–35.7)
MCV: 91 fL (ref 79–97)
Platelets: 298 10*3/uL (ref 150–450)
RBC: 4.2 x10E6/uL (ref 4.14–5.80)
RDW: 12 % (ref 11.6–15.4)
WBC: 10.7 10*3/uL (ref 3.4–10.8)

## 2020-09-18 LAB — TSH: TSH: 0.68 u[IU]/mL (ref 0.450–4.500)

## 2020-09-18 MED ORDER — OXYCODONE HCL 5 MG PO TABS: 5.0000 mg | ORAL_TABLET | Freq: Three times a day (TID) | ORAL | 0 refills | Status: DC | PRN

## 2020-09-18 NOTE — Telephone Encounter (Signed)
Spoke with patient. Patient does not need Covid screening as he tested positive within 90 days. Covid screen has been cancelled in Epic. No further lab work needed. Confirmed with Dr. Audie Box INR is not needed prior to TEE as patient is not on coumadin. Patient verbalized understanding.

## 2020-09-18 NOTE — Patient Instructions (Signed)

## 2020-09-18 NOTE — Progress Notes (Signed)
Subjective:  Patient ID: Ronald Franklin, male    DOB: 1951-03-13  Age: 70 y.o. MRN: [id number]  CC: Cough and Atrial Fibrillation  This visit occurred during the SARS-CoV-2 public health emergency.  Safety protocols were in place, including screening questions prior to the visit, additional usage of staff PPE, and extensive cleaning of exam room while observing appropriate contact time as indicated for disinfecting solutions.    HPI Ronald Franklin presents for f/up -  He was recently diagnosed with COVID-19 infection.  This was complicated by pneumonia.  He tells me his cough is much better and is not productive of phlegm.  He was subsequently diagnosed with a kidney stone and atrial fibrillation with RVR.  He complains of shortness of breath and dyspnea on exertion.  He denies chest pain, hemoptysis, fever, or chills.  The only thing he has found that controls the cough is oxycodone.  He is also taking oxycodone for the kidney stone pain.  Outpatient Medications Prior to Visit  Medication Sig Dispense Refill  . albuterol (VENTOLIN HFA) 108 (90 Base) MCG/ACT inhaler Inhale 2 puffs into the lungs every 6 (six) hours as needed for wheezing or shortness of breath. 18 g 0  . apixaban (ELIQUIS) 5 MG TABS tablet Take 1 tablet (5 mg total) by mouth 2 (two) times daily. 60 tablet 2  . Ascorbic Acid (VITAMIN C) 1000 MG tablet Take 2,000 mg by mouth daily.    . Cholecalciferol (VITAMIN D-3) 125 MCG (5000 UT) TABS Take 5,000 Units by mouth daily.    . Cyanocobalamin (VITAMIN B-12) 2500 MCG SUBL Place 2,500 mcg under the tongue daily.    Marland Kitchen diltiazem (CARDIZEM CD) 120 MG 24 hr capsule Take 1 capsule (120 mg total) by mouth daily. 90 capsule 3  . Menaquinone-7 (VITAMIN K2 PO) Take 1 tablet by mouth daily.    . Misc Natural Products (OSTEO BI-FLEX TRIPLE STRENGTH PO) Take 1 tablet by mouth daily.    . Multiple Vitamin (MULTIVITAMIN WITH MINERALS) TABS tablet Take 1 tablet by mouth daily. One-A-Day Men's  Health    . tamsulosin (FLOMAX) 0.4 MG CAPS capsule Take 1 capsule (0.4 mg total) by mouth daily. 30 capsule 0  . Turmeric 500 MG CAPS Take 500 mg by mouth daily.     . vitamin E 400 UNIT capsule Take 800 Units by mouth daily.    . Zinc 50 MG TABS Take 50 mg by mouth daily.    . benzonatate (TESSALON) 200 MG capsule Take 1 capsule (200 mg total) by mouth 3 (three) times daily as needed for cough. 30 capsule 0  . doxycycline (VIBRA-TABS) 100 MG tablet Take 1 tablet (100 mg total) by mouth 2 (two) times daily. 20 tablet 0  . ondansetron (ZOFRAN ODT) 4 MG disintegrating tablet Take 1 tablet (4 mg total) by mouth every 8 (eight) hours as needed for nausea or vomiting. 20 tablet 0  . oxyCODONE (ROXICODONE) 5 MG immediate release tablet Take 1 tablet (5 mg total) by mouth every 8 (eight) hours as needed. (Patient taking differently: Take 5 mg by mouth every 8 (eight) hours as needed for severe pain or moderate pain.) 15 tablet 0  . predniSONE (DELTASONE) 20 MG tablet Take 1 tablet (20 mg total) by mouth daily with breakfast. 5 tablet 0   No facility-administered medications prior to visit.    ROS Review of Systems  Constitutional: Negative.  Negative for appetite change, chills, diaphoresis and fatigue.  HENT: Negative.  Eyes: Negative.   Respiratory: Positive for cough and shortness of breath. Negative for chest tightness, wheezing and stridor.   Cardiovascular: Negative for chest pain, palpitations and leg swelling.  Gastrointestinal: Negative for abdominal pain, diarrhea, nausea and vomiting.  Endocrine: Negative.   Genitourinary: Positive for flank pain. Negative for difficulty urinating, dysuria and hematuria.  Skin: Negative.   Neurological: Negative.  Negative for dizziness, weakness, light-headedness and numbness.  Hematological: Negative for adenopathy. Does not bruise/bleed easily.  Psychiatric/Behavioral: Negative.     Objective:  BP 116/78   Pulse 67   Temp 98.4 F (36.9 C)  (Oral)   Ht 6' (1.829 m)   Wt 205 lb (93 kg)   SpO2 96%   BMI 27.80 kg/m   BP Readings from Last 3 Encounters:  09/20/20 110/77  09/18/20 116/78  09/17/20 120/88    Wt Readings from Last 3 Encounters:  09/20/20 200 lb (90.7 kg)  09/18/20 205 lb (93 kg)  09/17/20 203 lb (92.1 kg)    Physical Exam Vitals reviewed.  Constitutional:      Appearance: He is not ill-appearing.  HENT:     Nose: Nose normal.     Mouth/Throat:     Mouth: Mucous membranes are moist.  Eyes:     General: No scleral icterus.    Conjunctiva/sclera: Conjunctivae normal.  Cardiovascular:     Rate and Rhythm: Regular rhythm. Tachycardia present.     Pulses: Normal pulses.     Heart sounds: Normal heart sounds. No murmur heard. No friction rub.     Comments: EKG- Atrial flutter with variable AV block and premature ventricular complexes or aberrantly conducted complexes.  Nonspecific ST and T wave changes. Pulmonary:     Effort: Pulmonary effort is normal.     Breath sounds: No stridor. No wheezing, rhonchi or rales.  Abdominal:     General: Abdomen is protuberant. Bowel sounds are normal. There is no distension.     Palpations: Abdomen is soft. There is no hepatomegaly, splenomegaly or mass.     Tenderness: There is no abdominal tenderness.  Musculoskeletal:        General: Normal range of motion.     Cervical back: Neck supple.     Right lower leg: No edema.     Left lower leg: No edema.  Lymphadenopathy:     Cervical: No cervical adenopathy.  Skin:    General: Skin is warm and dry.     Coloration: Skin is not pale.  Neurological:     General: No focal deficit present.     Mental Status: He is alert.  Psychiatric:        Mood and Affect: Mood normal.        Behavior: Behavior normal.     Lab Results  Component Value Date   WBC 10.7 09/17/2020   HGB 12.9 (L) 09/17/2020   HCT 38.0 09/17/2020   PLT 298 09/17/2020   GLUCOSE 152 (H) 09/18/2020   CHOL 234 (H) 10/30/2019   TRIG 140.0  10/30/2019   HDL 46.10 10/30/2019   LDLDIRECT 206.6 08/04/2013   LDLCALC 160 (H) 10/30/2019   ALT 57 (H) 09/18/2020   AST 27 09/18/2020   NA 140 09/18/2020   K 4.3 09/18/2020   CL 101 09/18/2020   CREATININE 1.08 09/18/2020   BUN 23 09/18/2020   CO2 32 09/18/2020   TSH 0.680 09/17/2020   PSA 2.28 10/30/2019   INR 1.0 07/02/2008   HGBA1C 6.6 (H) 09/18/2020  DG Chest 2 View  Result Date: 09/10/2020 CLINICAL DATA:  COVID cough EXAM: CHEST - 2 VIEW COMPARISON:  07/02/2008 FINDINGS: Patchy left greater than right peripheral and basilar opacity, corresponding to history of COVID pneumonia. Mild cardiomegaly. No pleural effusion or pneumothorax IMPRESSION: Patchy bilateral lower lung opacity corresponding to history of COVID pneumonia. Electronically Signed   By: Donavan Foil M.D.   On: 09/10/2020 20:12   DG Chest 2 View  Result Date: 09/19/2020 CLINICAL DATA:  Cough.  History of COVID-19 EXAM: CHEST - 2 VIEW COMPARISON:  09/10/2020 FINDINGS: Heart size is upper limits of normal, stable. Mild patchy bibasilar opacities, improving from prior. No new focal airspace consolidation. No pleural effusion or pneumothorax. IMPRESSION: Mild patchy bibasilar opacities, improving from prior. Electronically Signed   By: Davina Poke D.O.   On: 09/19/2020 09:06    Assessment & Plan:   Jumar was seen today for cough and atrial fibrillation.  Diagnoses and all orders for this visit:  Tachycardia- His EKG shows A flutter.  He is scheduled for cardioversion later this week. -     EKG 12-Lead  Cough productive of clear sputum- Based on his symptoms, exam, and chest x-ray this is resolving.  No additional antibiotics are needed. -     DG Chest 2 View; Future  Hyperglycemia- His A1c is at 6.6%.  He has new onset DM 2.  Medical therapy is not yet indicated. -     Basic metabolic panel; Future -     Hemoglobin A1c; Future -     Hemoglobin A1c -     Basic metabolic panel  Clinical diagnosis of  COVID-19- His liver enzymes are improving. -     Hepatic function panel; Future -     Hepatic function panel  Kidney stone -     oxyCODONE (ROXICODONE) 5 MG immediate release tablet; Take 1 tablet (5 mg total) by mouth every 8 (eight) hours as needed.  Type II diabetes mellitus with manifestations ([place])   I have discontinued Costantino K. Schreck "Ken"'s ondansetron, doxycycline, benzonatate, and predniSONE. I am also having him maintain his vitamin C, Turmeric, Vitamin B-12, Misc Natural Products (OSTEO BI-FLEX TRIPLE STRENGTH PO), Zinc, Vitamin D-3, vitamin E, multivitamin with minerals, Menaquinone-7 (VITAMIN K2 PO), tamsulosin, albuterol, diltiazem, apixaban, and oxyCODONE.  Meds ordered this encounter  Medications  . oxyCODONE (ROXICODONE) 5 MG immediate release tablet    Sig: Take 1 tablet (5 mg total) by mouth every 8 (eight) hours as needed.    Dispense:  35 tablet    Refill:  0     Follow-up: Return in about 3 weeks (around 10/09/2020).  Scarlette Calico, MD

## 2020-09-18 NOTE — Telephone Encounter (Signed)
Ronald Franklin is calling stating Ronald Franklin has him setup for labs today. He states is wanting to confirm he needs them due to having labs drawn at our office yesterday. He also states the Covid screening clinic called and advised him he does not need a Covid test due to testing positive in the last 90 days. Ronald Franklin is requesting the notes from his procedure done about a year ago be looked over prior to his scheduled procedure on Friday due to having the same thing performed then. Please advise.

## 2020-09-18 NOTE — Telephone Encounter (Signed)
See telephone note.

## 2020-09-18 NOTE — Progress Notes (Addendum)
Pt informed not to show up for his covid test, as he tested + for covid today(09/18/20) due to pt testing + for covid on 09/04/20(results found in care everywhere). Based on the guidelines the pt is in the 90 day window to not retest. The pt is still expected to quarantine until their procedure. Therefore, the pt can still have the scheduled procedure. This appointment will be cancelled.

## 2020-09-19 ENCOUNTER — Other Ambulatory Visit: Payer: Self-pay

## 2020-09-19 ENCOUNTER — Encounter: Payer: Self-pay | Admitting: Internal Medicine

## 2020-09-19 LAB — BASIC METABOLIC PANEL
BUN: 23 mg/dL (ref 6–23)
CO2: 32 mEq/L (ref 19–32)
Calcium: 10.4 mg/dL (ref 8.4–10.5)
Chloride: 101 mEq/L (ref 96–112)
Creatinine, Ser: 1.08 mg/dL (ref 0.40–1.50)
GFR: 69.4 mL/min (ref >60.00)
Glucose, Bld: 152 mg/dL — ABNORMAL HIGH (ref 70–99)
Potassium: 4.3 mEq/L (ref 3.5–5.1)
Sodium: 140 mEq/L (ref 135–145)

## 2020-09-19 LAB — HEPATIC FUNCTION PANEL
ALT: 57 U/L — ABNORMAL HIGH (ref 0–53)
AST: 27 U/L (ref 0–37)
Albumin: 3.8 g/dL (ref 3.5–5.2)
Alkaline Phosphatase: 84 U/L (ref 39–117)
Bilirubin, Direct: 0.1 mg/dL (ref 0.0–0.3)
Total Bilirubin: 0.3 mg/dL (ref 0.2–1.2)
Total Protein: 7.5 g/dL (ref 6.0–8.3)

## 2020-09-19 LAB — HEMOGLOBIN A1C: Hgb A1c MFr Bld: 6.6 % — ABNORMAL HIGH (ref 4.6–6.5)

## 2020-09-20 ENCOUNTER — Ambulatory Visit (HOSPITAL_COMMUNITY): Payer: Medicare HMO | Admitting: Anesthesiology

## 2020-09-20 ENCOUNTER — Encounter (HOSPITAL_COMMUNITY): Payer: Self-pay | Admitting: Cardiovascular Disease

## 2020-09-20 ENCOUNTER — Ambulatory Visit (HOSPITAL_COMMUNITY)
Admission: RE | Admit: 2020-09-20 | Discharge: 2020-09-20 | Disposition: A | Discharge status: Home / Self Care | Payer: Medicare HMO | Attending: Cardiovascular Disease | Admitting: Cardiovascular Disease

## 2020-09-20 ENCOUNTER — Encounter (HOSPITAL_COMMUNITY)
Admission: RE | Disposition: A | Discharge status: Home / Self Care | Payer: Self-pay | Source: Home / Self Care | Attending: Cardiovascular Disease

## 2020-09-20 ENCOUNTER — Encounter: Payer: Self-pay | Admitting: Internal Medicine

## 2020-09-20 ENCOUNTER — Ambulatory Visit (HOSPITAL_BASED_OUTPATIENT_CLINIC_OR_DEPARTMENT_OTHER)
Admission: RE | Admit: 2020-09-20 | Discharge: 2020-09-20 | Disposition: A | Discharge status: Home / Self Care | Payer: Medicare HMO | Source: Ambulatory Visit | Attending: Cardiovascular Disease | Admitting: Cardiovascular Disease

## 2020-09-20 DIAGNOSIS — I4819 Other persistent atrial fibrillation: Secondary | ICD-10-CM

## 2020-09-20 DIAGNOSIS — E118 Type 2 diabetes mellitus with unspecified complications: Secondary | ICD-10-CM

## 2020-09-20 DIAGNOSIS — E785 Hyperlipidemia, unspecified: Secondary | ICD-10-CM | POA: Diagnosis not present

## 2020-09-20 DIAGNOSIS — Z7901 Long term (current) use of anticoagulants: Secondary | ICD-10-CM | POA: Insufficient documentation

## 2020-09-20 DIAGNOSIS — I499 Cardiac arrhythmia, unspecified: Secondary | ICD-10-CM | POA: Diagnosis not present

## 2020-09-20 DIAGNOSIS — I361 Nonrheumatic tricuspid (valve) insufficiency: Secondary | ICD-10-CM

## 2020-09-20 DIAGNOSIS — I4891 Unspecified atrial fibrillation: Secondary | ICD-10-CM | POA: Diagnosis not present

## 2020-09-20 DIAGNOSIS — I071 Rheumatic tricuspid insufficiency: Secondary | ICD-10-CM | POA: Diagnosis not present

## 2020-09-20 DIAGNOSIS — I10 Essential (primary) hypertension: Secondary | ICD-10-CM | POA: Insufficient documentation

## 2020-09-20 DIAGNOSIS — Z79899 Other long term (current) drug therapy: Secondary | ICD-10-CM | POA: Diagnosis not present

## 2020-09-20 DIAGNOSIS — N200 Calculus of kidney: Secondary | ICD-10-CM | POA: Insufficient documentation

## 2020-09-20 DIAGNOSIS — Z8616 Personal history of COVID-19: Secondary | ICD-10-CM | POA: Insufficient documentation

## 2020-09-20 DIAGNOSIS — I483 Typical atrial flutter: Secondary | ICD-10-CM | POA: Insufficient documentation

## 2020-09-20 DIAGNOSIS — Z96642 Presence of left artificial hip joint: Secondary | ICD-10-CM | POA: Insufficient documentation

## 2020-09-20 HISTORY — DX: Type 2 diabetes mellitus with unspecified complications: E11.8

## 2020-09-20 HISTORY — PX: CARDIOVERSION: SHX1299

## 2020-09-20 HISTORY — PX: TEE WITHOUT CARDIOVERSION: SHX5443

## 2020-09-20 SURGERY — ECHOCARDIOGRAM, TRANSESOPHAGEAL
Anesthesia: General

## 2020-09-20 MED ORDER — SODIUM CHLORIDE 0.9 % IV SOLN: INTRAVENOUS | Status: DC

## 2020-09-20 MED ORDER — PHENYLEPHRINE HCL (PRESSORS) 10 MG/ML IV SOLN
INTRAVENOUS | Status: DC | PRN
  Administered 2020-09-20: 80 ug via INTRAVENOUS

## 2020-09-20 MED ORDER — LACTATED RINGERS IV SOLN: INTRAVENOUS | Status: DC | PRN

## 2020-09-20 MED ORDER — LIDOCAINE HCL (CARDIAC) PF 100 MG/5ML IV SOSY
PREFILLED_SYRINGE | INTRAVENOUS | Status: DC | PRN
  Administered 2020-09-20: 20 mg via INTRAVENOUS

## 2020-09-20 MED ORDER — PROPOFOL 500 MG/50ML IV EMUL
INTRAVENOUS | Status: DC | PRN
  Administered 2020-09-20: 20 mg via INTRAVENOUS
  Administered 2020-09-20: 100 ug/kg/min via INTRAVENOUS

## 2020-09-20 NOTE — Anesthesia Procedure Notes (Signed)
Procedure Name: MAC Date/Time: 09/20/2020 10:00 AM Performed by: Lavell Luster, CRNA Pre-anesthesia Checklist: Patient identified, Suction available, Emergency Drugs available, Patient being monitored and Timeout performed Patient Re-evaluated:Patient Re-evaluated prior to induction Oxygen Delivery Method: Nasal cannula Preoxygenation: Pre-oxygenation with 100% oxygen Induction Type: IV induction Placement Confirmation: breath sounds checked- equal and bilateral and positive ETCO2 Dental Injury: Teeth and Oropharynx as per pre-operative assessment

## 2020-09-20 NOTE — Transfer of Care (Signed)
Immediate Anesthesia Transfer of Care Note  Patient: Ronald Franklin  Procedure(s) Performed: TRANSESOPHAGEAL ECHOCARDIOGRAM (TEE) (N/A ) CARDIOVERSION (N/A )  Patient Location: Endoscopy Unit  Anesthesia Type:MAC  Level of Consciousness: awake, alert  and sedated  Airway & Oxygen Therapy: Patient connected to nasal cannula oxygen  Post-op Assessment: Post -op Vital signs reviewed and stable  Post vital signs: stable  Last Vitals:  Vitals Value Taken Time  BP    Temp    Pulse    Resp    SpO2      Last Pain:  Vitals:   09/20/20 0924  TempSrc: Oral  PainSc: 0-No pain         Complications: No complications documented.

## 2020-09-20 NOTE — Anesthesia Postprocedure Evaluation (Signed)
Anesthesia Post Note  Patient: Ronald Franklin  Procedure(s) Performed: TRANSESOPHAGEAL ECHOCARDIOGRAM (TEE) (N/A ) CARDIOVERSION (N/A )     Patient location during evaluation: Endoscopy Anesthesia Type: General Level of consciousness: awake Pain management: pain level controlled Vital Signs Assessment: post-procedure vital signs reviewed and stable Respiratory status: spontaneous breathing Cardiovascular status: stable Postop Assessment: no apparent nausea or vomiting Anesthetic complications: no   No complications documented.  Last Vitals:  Vitals:   09/20/20 1033 09/20/20 1041  BP: (!) 104/57 110/77  Pulse: 89 91  Resp: (!) 22 18  Temp:    SpO2: 94% 96%    Last Pain:  Vitals:   09/20/20 1041  TempSrc:   PainSc: 0-No pain                 Genecis Veley

## 2020-09-20 NOTE — Op Note (Signed)
Procedure: Electrical Cardioversion Indications:  Atrial Fibrillation  Procedure Details:  Consent: Risks of procedure as well as the alternatives and risks of each were explained to the (patient/caregiver).  Consent for procedure obtained.  Time Out: Verified patient identification, verified procedure, site/side was marked, verified correct patient position, special equipment/implants available, medications/allergies/relevent history reviewed, required imaging and test results available.  Performed  Patient placed on cardiac monitor, pulse oximetry, supplemental oxygen as necessary.  Sedation given: propofol IV Pacer pads placed anterior and posterior chest.  Cardioverted 1 time(s).  Cardioversion with synchronized biphasic 150J shock.  Evaluation: Findings: Post procedure EKG shows: NSR Complications: None Patient did tolerate procedure well.  Time Spent Directly with the Patient:  30 minutes   Ronald Franklin 09/20/2020, 10:17 AM

## 2020-09-20 NOTE — Anesthesia Preprocedure Evaluation (Addendum)
Anesthesia Evaluation  Patient identified by MRN, date of birth, ID band Patient awake    Reviewed: Allergy & Precautions, NPO status , Patient's Chart, lab work & pertinent test results  Airway Mallampati: II  TM Distance: >3 FB     Dental   Pulmonary sleep apnea ,    breath sounds clear to auscultation       Cardiovascular hypertension,  Rhythm:Irregular Rate:Normal     Neuro/Psych  Neuromuscular disease    GI/Hepatic negative GI ROS, Neg liver ROS,   Endo/Other    Renal/GU Renal disease     Musculoskeletal   Abdominal   Peds  Hematology  (+) anemia ,   Anesthesia Other Findings   Reproductive/Obstetrics                            Anesthesia Physical Anesthesia Plan  ASA: III  Anesthesia Plan: General   Post-op Pain Management:    Induction: Intravenous  PONV Risk Score and Plan: 2 and Propofol infusion  Airway Management Planned: Nasal Cannula and Simple Face Mask  Additional Equipment:   Intra-op Plan:   Post-operative Plan:   Informed Consent: I have reviewed the patients History and Physical, chart, labs and discussed the procedure including the risks, benefits and alternatives for the proposed anesthesia with the patient or authorized representative who has indicated his/her understanding and acceptance.     Dental advisory given  Plan Discussed with: CRNA and Anesthesiologist  Anesthesia Plan Comments:         Anesthesia Quick Evaluation

## 2020-09-20 NOTE — Discharge Instructions (Signed)
Electrical Cardioversion Electrical cardioversion is the delivery of a jolt of electricity to restore a normal rhythm to the heart. A rhythm that is too fast or is not regular keeps the heart from pumping well. In this procedure, sticky patches or metal paddles are placed on the chest to deliver electricity to the heart from a device. This procedure may be done in an emergency if:  There is low or no blood pressure as a result of the heart rhythm.  Normal rhythm must be restored as fast as possible to protect the brain and heart from further damage.  It may save a life. This may also be a scheduled procedure for irregular or fast heart rhythms that are not immediately life-threatening. Tell a health care provider about:  Any allergies you have.  All medicines you are taking, including vitamins, herbs, eye drops, creams, and over-the-counter medicines.  Any problems you or family members have had with anesthetic medicines.  Any blood disorders you have.  Any surgeries you have had.  Any medical conditions you have.  Whether you are pregnant or may be pregnant. What are the risks? Generally, this is a safe procedure. However, problems may occur, including:  Allergic reactions to medicines.  A blood clot that breaks free and travels to other parts of your body.  The possible return of an abnormal heart rhythm within hours or days after the procedure.  Your heart stopping (cardiac arrest). This is rare. What happens before the procedure? Medicines  Your health care provider may have you start taking: ? Blood-thinning medicines (anticoagulants) so your blood does not clot as easily. ? Medicines to help stabilize your heart rate and rhythm.  Ask your health care provider about: ? Changing or stopping your regular medicines. This is especially important if you are taking diabetes medicines or blood thinners. ? Taking medicines such as aspirin and ibuprofen. These medicines can  thin your blood. Do not take these medicines unless your health care provider tells you to take them. ? Taking over-the-counter medicines, vitamins, herbs, and supplements. General instructions  Follow instructions from your health care provider about eating or drinking restrictions.  Plan to have someone take you home from the hospital or clinic.  If you will be going home right after the procedure, plan to have someone with you for 24 hours.  Ask your health care provider what steps will be taken to help prevent infection. These may include washing your skin with a germ-killing soap. What happens during the procedure?  An IV will be inserted into one of your veins.  Sticky patches (electrodes) or metal paddles may be placed on your chest.  You will be given a medicine to help you relax (sedative).  An electrical shock will be delivered. The procedure may vary among health care providers and hospitals.   What can I expect after the procedure?  Your blood pressure, heart rate, breathing rate, and blood oxygen level will be monitored until you leave the hospital or clinic.  Your heart rhythm will be watched to make sure it does not change.  You may have some redness on the skin where the shocks were given. Follow these instructions at home:  Do not drive for 24 hours if you were given a sedative during your procedure.  Take over-the-counter and prescription medicines only as told by your health care provider.  Ask your health care provider how to check your pulse. Check it often.  Rest for 48 hours after the procedure   or as told by your health care provider.  Avoid or limit your caffeine use as told by your health care provider.  Keep all follow-up visits as told by your health care provider. This is important. Contact a health care provider if:  You feel like your heart is beating too quickly or your pulse is not regular.  You have a serious muscle cramp that does not go  away. Get help right away if:  You have discomfort in your chest.  You are dizzy or you feel faint.  You have trouble breathing or you are short of breath.  Your speech is slurred.  You have trouble moving an arm or leg on one side of your body.  Your fingers or toes turn cold or blue. Summary  Electrical cardioversion is the delivery of a jolt of electricity to restore a normal rhythm to the heart.  This procedure may be done right away in an emergency or may be a scheduled procedure if the condition is not an emergency.  Generally, this is a safe procedure.  After the procedure, check your pulse often as told by your health care provider. This information is not intended to replace advice given to you by your health care provider. Make sure you discuss any questions you have with your health care provider. Document Revised: 03/06/2019 Document Reviewed: 03/06/2019 Elsevier Patient Education  2021 [employer]  

## 2020-09-20 NOTE — Progress Notes (Signed)
  Echocardiogram Echocardiogram Transesophageal has been performed.  Ronald Franklin 09/20/2020, 11:05 AM

## 2020-09-20 NOTE — Op Note (Signed)
INDICATIONS: atrial fibrillation pre-cardioversion  PROCEDURE:   Informed consent was obtained prior to the procedure. The risks, benefits and alternatives for the procedure were discussed and the patient comprehended these risks.  Risks include, but are not limited to, cough, sore throat, vomiting, nausea, somnolence, esophageal and stomach trauma or perforation, bleeding, low blood pressure, aspiration, pneumonia, infection, trauma to the teeth and death.    After a procedural time-out, the oropharynx was anesthetized with 20% benzocaine spray.   During this procedure the patient was administered IV propofol by Anesthesiology, Dr. Carlota Raspberry.  The transesophageal probe was inserted in the esophagus and stomach without difficulty and multiple views were obtained.  The patient was kept under observation until the patient left the procedure room.  The patient left the procedure room in stable condition.   Agitated microbubble saline contrast was not administered.  COMPLICATIONS:    There were no immediate complications.  FINDINGS:  No left atrial thrombus. Normal left ventricular systolic function. Dilated RV and 2+TR, normal RV systolic function.  RECOMMENDATIONS:     Proceed with DCCV.  Time Spent Directly with the Patient:  30 minutes   Ronald Franklin 09/20/2020, 10:16 AM

## 2020-09-20 NOTE — Interval H&P Note (Signed)
History and Physical Interval Note:  09/20/2020 9:09 AM  Ronald Franklin  has presented today for surgery, with the diagnosis of afib.  The various methods of treatment have been discussed with the patient and family. After consideration of risks, benefits and other options for treatment, the patient has consented to  Procedure(s): TRANSESOPHAGEAL ECHOCARDIOGRAM (TEE) (N/A) CARDIOVERSION (N/A) as a surgical intervention.  The patient's history has been reviewed, patient examined, no change in status, stable for surgery.  I have reviewed the patient's chart and labs.  Questions were answered to the patient's satisfaction.     Ronald Franklin

## 2020-09-22 ENCOUNTER — Encounter (HOSPITAL_COMMUNITY): Payer: Self-pay | Admitting: Cardiovascular Disease

## 2020-09-23 ENCOUNTER — Other Ambulatory Visit: Payer: Self-pay | Admitting: Internal Medicine

## 2020-09-23 ENCOUNTER — Encounter: Payer: Self-pay | Admitting: Internal Medicine

## 2020-09-23 DIAGNOSIS — R040 Epistaxis: Secondary | ICD-10-CM | POA: Insufficient documentation

## 2020-09-25 ENCOUNTER — Other Ambulatory Visit: Payer: Self-pay | Admitting: Urology

## 2020-09-25 DIAGNOSIS — N201 Calculus of ureter: Secondary | ICD-10-CM

## 2020-09-26 ENCOUNTER — Ambulatory Visit (HOSPITAL_BASED_OUTPATIENT_CLINIC_OR_DEPARTMENT_OTHER): Admission: RE | Admit: 2020-09-26 | Payer: Medicare HMO | Source: Home / Self Care | Admitting: Urology

## 2020-09-26 SURGERY — LITHOTRIPSY, ESWL
Anesthesia: LOCAL | Laterality: Left

## 2020-10-02 ENCOUNTER — Ambulatory Visit (INDEPENDENT_AMBULATORY_CARE_PROVIDER_SITE_OTHER): Payer: Medicare HMO | Admitting: Otolaryngology

## 2020-10-06 NOTE — Progress Notes (Signed)
Cardiology Office Note:   Date:  10/08/2020  NAME:  Ronald Franklin    MRN: [id number] DOB:  May 05, 1951   PCP:  Janith Lima, MD  Cardiologist:  No primary care provider on file.  Electrophysiologist:  None   Referring MD: Janith Lima, MD   Chief Complaint  Patient presents with  . Follow-up         History of Present Illness:   Ronald Franklin is a 70 y.o. male with a hx of atrial flutter s/p TEE/DCCV, HLD, DM who presents for follow-up. Recently found to have Aflutter in setting of kidney stone. TEE/DCCV 09/20/2020. Follow-up today.  EKG shows he is still in sinus rhythm.  He can stop his diltiazem.  No recurrence of atrial flutter.  No palpitations reported.  He still has a few more weeks on his Eliquis.  No bleeding reported.  We discussed that given his diabetes long-term anticoagulation may be indicated.  He reports he is worked on his diet and likely no longer diabetic.  He will have this rechecked by his primary care physician in the next few weeks.  He will notify me of results.  We discussed he needs monitoring to look for atrial fibrillation.  We also discussed cardiac level.  He is okay to proceed with this.  He denies any chest pain, shortness of breath or palpitations in office.  Overall doing well.  No further recurrence of atrial flutter.  Of note, he did pass his kidney stone.  He did not require procedure for this.  Problem list 1.  Atrial flutter 09/17/2020  -CHADSVASC=2 (age, DM) -TEE/DCCV 09/20/2020 2.  Hyperlipidemia -Total cholesterol 237, HDL 46, LDL 160, triglycerides 140 3. DM -A1c 6.6   Past Medical History: Past Medical History:  Diagnosis Date  . Allergic rhinitis   . Arthritis   . CTS (carpal tunnel syndrome)   . Hypertension   . OSA (obstructive sleep apnea)   . Type II diabetes mellitus with manifestations ([place]) 09/20/2020    Past Surgical History: Past Surgical History:  Procedure Laterality Date  . BACK SURGERY     C4-5-6   2 cervical     from AA  . CARDIOVERSION N/A 09/20/2020   Procedure: CARDIOVERSION;  Surgeon: Sanda Klein, MD;  Location: [facility];  Service: Cardiovascular;  Laterality: N/A;  . CARPAL TUNNEL RELEASE  09/16/2011   Procedure: CARPAL TUNNEL RELEASE;  Surgeon: Floyce Stakes, MD;  Location: [facility];  Service: Neurosurgery;  Laterality: Left;  Left Carpal Tunnel Release  . CERVICAL LAMINECTOMY    . EXTRACORPOREAL SHOCK WAVE LITHOTRIPSY Left 09/16/2020   Procedure: EXTRACORPOREAL SHOCK WAVE LITHOTRIPSY (ESWL);  Surgeon: Raynelle Bring, MD;  Location: [facility];  Service: Urology;  Laterality: Left;  . KNEE ARTHROSCOPY WITH MEDIAL MENISECTOMY  07/06/2012   Procedure: KNEE ARTHROSCOPY WITH MEDIAL MENISECTOMY;  Surgeon: Magnus Sinning, MD;  Location: [facility];  Service: Orthopedics;  Laterality: Left;  LEFT KNEE ARTHROSCOPY WITH PARTIAL MEDIAL MENISECTOMY  . KNEE SURGERY    . NECK SURGERY    . ROTATOR CUFF REPAIR     bilateral   . TEE WITHOUT CARDIOVERSION N/A 09/20/2020   Procedure: TRANSESOPHAGEAL ECHOCARDIOGRAM (TEE);  Surgeon: Sanda Klein, MD;  Location: [facility];  Service: Cardiovascular;  Laterality: N/A;  . TOTAL HIP ARTHROPLASTY Left 01/19/2019   Procedure: TOTAL HIP ARTHROPLASTY ANTERIOR APPROACH;  Surgeon: Rod Can, MD;  Location: [facility];  Service: Orthopedics;  Laterality: Left;  Current Medications: Current Meds  Medication Sig  . apixaban (ELIQUIS) 5 MG TABS tablet Take 1 tablet (5 mg total) by mouth 2 (two) times daily.  . Ascorbic Acid (VITAMIN C) 1000 MG tablet Take 2,000 mg by mouth daily.  . Cholecalciferol (VITAMIN D-3) 125 MCG (5000 UT) TABS Take 5,000 Units by mouth daily.  . Menaquinone-7 (VITAMIN K2 PO) Take 1 tablet by mouth daily.  . Misc Natural Products (OSTEO BI-FLEX TRIPLE STRENGTH PO) Take 1 tablet by mouth daily.  . Multiple Vitamin (MULTIVITAMIN WITH MINERALS) TABS tablet Take 1 tablet by mouth daily. One-A-Day Men's Health  . Turmeric  500 MG CAPS Take 500 mg by mouth daily.   . vitamin E 400 UNIT capsule Take 800 Units by mouth daily.  . Zinc 50 MG TABS Take 50 mg by mouth daily.  . [DISCONTINUED] diltiazem (CARDIZEM CD) 120 MG 24 hr capsule Take 1 capsule (120 mg total) by mouth daily.     Allergies:    Patient has no known allergies.   Social History: Social History   Socioeconomic History  . Marital status: Married    Spouse name: Not on file  . Number of children: 6  . Years of education: BS  . Highest education level: Not on file  Occupational History  . Occupation: Retired Special educational needs teacher  Tobacco Use  . Smoking status: Never Smoker  . Smokeless tobacco: Never Used  Substance and Sexual Activity  . Alcohol use: Yes    Alcohol/week: 5.0 standard drinks    Types: 5 Glasses of wine per week    Comment: socially  . Drug use: No  . Sexual activity: Yes    Birth control/protection: None  Other Topics Concern  . 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 Has children.   Drinks 1 cup of coffee a day    Social Determinants of Radio broadcast assistant Strain: Not on file  Food Insecurity: Not on file  Transportation Needs: Not on file  Physical Activity: Not on file  Stress: Not on file  Social Connections: Not on file     Family History: The patient's family history includes Diabetes in his brother and brother; Hyperlipidemia in an other family member; Prostate cancer in an other family member. There is no history of Heart disease, Early death, Alcohol abuse, Arthritis, Cancer, Stroke, Kidney disease, or Hypertension.  ROS:   All other ROS reviewed and negative. Pertinent positives noted in the HPI.     EKGs/Labs/Other Studies Reviewed:   The following studies were personally reviewed by me today:  EKG:  EKG is ordered today.  The ekg ordered today demonstrates sinus bradycardia heart  rate 58, no acute ischemic changes, no evidence of prior infarction, and was personally reviewed by me.   TEE 09/20/2020 1. Left ventricular ejection fraction, by estimation, is 55 to 60%. The  left ventricle has normal function. The left ventricle has no regional  wall motion abnormalities. Left ventricular diastolic function could not  be evaluated.  2. Right ventricular systolic function is normal. The right ventricular  size is mildly enlarged. There is mildly elevated pulmonary artery  systolic pressure.  3. No left atrial/left atrial appendage thrombus was detected. The LAA  emptying velocity was 60 cm/s.  4. The mitral valve is normal in structure. Trivial mitral valve  regurgitation. No evidence of mitral stenosis.  5. Tricuspid valve regurgitation is mild to moderate.  6. The aortic valve is normal in structure. Aortic valve regurgitation is  not visualized. No aortic stenosis is present.  7. The inferior vena cava is normal in size with greater than 50%  respiratory variability, suggesting right atrial pressure of 3 mmHg.   Recent Labs: 09/17/2020: Hemoglobin 12.9; Platelets 298; TSH 0.680 09/18/2020: ALT 57; BUN 23; Creatinine, Ser 1.08; Potassium 4.3; Sodium 140   Recent Lipid Panel    Component Value Date/Time   CHOL 234 (H) 10/30/2019 1049   TRIG 140.0 10/30/2019 1049   HDL 46.10 10/30/2019 1049   CHOLHDL 5 10/30/2019 1049   VLDL 28.0 10/30/2019 1049   LDLCALC 160 (H) 10/30/2019 1049   LDLDIRECT 206.6 08/04/2013 1003    Physical Exam:   VS:  BP 132/72   Pulse (!) 58   Ht 6' (1.829 m)   Wt 202 lb 6.4 oz (91.8 kg)   SpO2 96%   BMI 27.45 kg/m    Wt Readings from Last 3 Encounters:  10/08/20 202 lb 6.4 oz (91.8 kg)  09/20/20 200 lb (90.7 kg)  09/18/20 205 lb (93 kg)    General: Well nourished, well developed, in no acute distress Head: Atraumatic, normal size  Eyes: PEERLA, EOMI  Neck: Supple, no JVD Endocrine: No thryomegaly Cardiac: Normal S1, S2;  RRR; no murmurs, rubs, or gallops Lungs: Clear to auscultation bilaterally, no wheezing, rhonchi or rales  Abd: Soft, nontender, no hepatomegaly  Ext: No edema, pulses 2+ Musculoskeletal: No deformities, BUE and BLE strength normal and equal Skin: Warm and dry, no rashes   Neuro: Alert and oriented to person, place, time, and situation, CNII-XII grossly intact, no focal deficits  Psych: Normal mood and affect   ASSESSMENT:   Ronald Franklin is a 70 y.o. male who presents for the following: 1. Typical atrial flutter ([place])   2. Mixed hyperlipidemia     PLAN:   1. Typical atrial flutter (H[facility]) -Diagnosed with typical atrial flutter in the setting of a kidney stone.  Status post TEE/cardioversion.  We initially plan for 4 weeks anticoagulation after but this was before known he is diabetic.  He reports this was strictly related to binge eating after COVID-19.  He reports he is having this retested by primary care physician.  If he is no longer diabetic I would be okay to stop his anticoagulation after 4 weeks of therapy after his cardioversion.  For now he will continue.  He will notify me of the results. -Okay to stop diltiazem.  I recommended a 2-week Zio patch to look for any recurrence of arrhythmia.  We also need to make sure there is no atrial fibrillation. -If nothing is detected he will then proceed with extended monitoring with a cardia mobile device.  He will look into this. -Thyroid studies were normal.  Echocardiogram normal LV function.  Normal chamber dimensions.  I suspect this is all just related from the stress of kidney stones.  2. Mixed hyperlipidemia -He will continue with diet exercise.  Likely will recommend calcium scoring when I see him back.  Disposition: Return in about 6 months (around 04/07/2021).  Medication Adjustments/Labs and Tests Ordered: Current medicines are reviewed at length with the patient today.  Concerns regarding medicines are outlined above.  Orders  Placed This Encounter  Procedures  . LONG TERM MONITOR (3-14 DAYS)  . EKG 12-Lead   No orders of the defined types were placed in this encounter.  Patient Instructions  Medication Instructions:  Stop Diltiazem   *If you need a refill on your cardiac medications before your next appointment, please call your pharmacy*   Testing/Procedures: ZIO XT- Long Term Monitor Instructions   Your physician has requested you wear your ZIO patch monitor___14____days.   This is a single patch monitor.  [employer] supplies one patch monitor per enrollment.  Additional stickers are not available.   Please do not apply patch if you will be having a Nuclear Stress Test, Echocardiogram, Cardiac CT, MRI, or Chest Xray during the time frame you would be wearing the monitor. The patch cannot be worn during these tests.  You cannot remove and re-apply the ZIO XT patch monitor.   Your ZIO patch monitor will be sent USPS Priority mail from [facility] directly to your home address. The monitor may also be mailed to a PO BOX if home delivery is not available.   It may take 3-5 days to receive your monitor after you have been enrolled.   Once you have received you monitor, please review enclosed instructions.  Your monitor has already been registered assigning a specific monitor serial # to you.   Applying the monitor   Shave hair from upper left chest.   Hold abrader disc by orange tab.  Rub abrader in 40 strokes over left upper chest as indicated in your monitor instructions.   Clean area with 4 enclosed alcohol pads .  Use all pads to assure are is cleaned thoroughly.  Let dry.   Apply patch as indicated in monitor instructions.  Patch will be place under collarbone on left side of chest with arrow pointing upward.   Rub patch adhesive wings for 2 minutes.Remove white label marked "1".  Remove white label marked "2".  Rub patch adhesive wings for 2 additional minutes.   While looking in a  mirror, press and release button in center of patch.  A small green light will flash 3-4 times .  This will be your only indicator the monitor has been turned on.     Do not shower for the first 24 hours.  You may shower after the first 24 hours.   Press button if you feel a symptom. You will hear a small click.  Record Date, Time and Symptom in the Patient Log Book.   When you are ready to remove patch, follow instructions on last 2 pages of Patient Log Book.  Stick patch monitor onto last page of Patient Log Book.   Place Patient Log Book in [place] box.  Use locking tab on box and tape box closed securely.  The [employer] and [employer] has [employer] on it.  Please place in mailbox as soon as possible.  Your physician should have your test results approximately 7 days after the monitor has been mailed back to [facility].   Call [place] at [phone] if you have questions regarding your ZIO XT patch monitor.  Call them immediately if you see an orange light blinking on your monitor.   If your monitor falls off in less than 4 days contact our Monitor department at [phone].  If your monitor becomes loose or falls off after 4 days call [employer] at [phone] for suggestions on securing your monitor.     Follow-Up: At [facility], you and your health needs are our priority.  As part of our continuing mission to provide you with exceptional heart care, we have created designated Provider Care Teams.  These Care Teams include your primary Cardiologist (physician) and Advanced Practice Providers (APPs -  Physician Assistants and Nurse Practitioners) who all work together to provide you with the care you need, when you need it.  We recommend signing up for the patient portal called "MyChart".  Sign up information is provided on this After Visit Summary.  MyChart is used to connect with patients for Virtual Visits (Telemedicine).  Patients are able to view lab/test  results, encounter notes, upcoming appointments, etc.  Non-urgent messages can be sent to your provider as well.   To learn more about what you can do with MyChart, go to [email or web address].    Your next appointment:   6 month(s)  The format for your next appointment:   In Person  Provider:   Eleonore Chiquito, MD       Time Spent with Patient: I have spent a total of 35 minutes with patient reviewing hospital notes, telemetry, EKGs, labs and examining the patient as well as establishing an assessment and plan that was discussed with the patient.  > 50% of time was spent in direct patient care.  Signed, Addison Naegeli. Audie Box, Ronald Franklin  [address], [place], [place] [address] [phone]  10/08/2020 12:04 PM

## 2020-10-08 ENCOUNTER — Encounter: Payer: Self-pay | Admitting: Cardiovascular Disease

## 2020-10-08 ENCOUNTER — Other Ambulatory Visit: Payer: Self-pay

## 2020-10-08 ENCOUNTER — Ambulatory Visit (INDEPENDENT_AMBULATORY_CARE_PROVIDER_SITE_OTHER): Payer: Medicare HMO

## 2020-10-08 ENCOUNTER — Encounter: Payer: Self-pay | Admitting: *Deleted

## 2020-10-08 ENCOUNTER — Ambulatory Visit (INDEPENDENT_AMBULATORY_CARE_PROVIDER_SITE_OTHER): Payer: Medicare HMO | Admitting: Cardiovascular Disease

## 2020-10-08 VITALS — BP 132/72 | HR 58 | Ht 72.0 in | Wt 202.4 lb

## 2020-10-08 DIAGNOSIS — I483 Typical atrial flutter: Secondary | ICD-10-CM

## 2020-10-08 DIAGNOSIS — E782 Mixed hyperlipidemia: Secondary | ICD-10-CM

## 2020-10-08 NOTE — Progress Notes (Signed)
Patient ID: Ronald Franklin, male   DOB: [birth year], 70 y.o.   MRN: [id number] Patient enrolled for [employer] to ship a 14 day ZIO XT long term holter monitor to his home.

## 2020-10-08 NOTE — Patient Instructions (Signed)
Medication Instructions:  Stop Diltiazem   *If you need a refill on your cardiac medications before your next appointment, please call your pharmacy*   Testing/Procedures: ZIO XT- Long Term Monitor Instructions   Your physician has requested you wear your ZIO patch monitor___14____days.   This is a single patch monitor.  [employer] supplies one patch monitor per enrollment.  Additional stickers are not available.   Please do not apply patch if you will be having a Nuclear Stress Test, Echocardiogram, Cardiac CT, MRI, or Chest Xray during the time frame you would be wearing the monitor. The patch cannot be worn during these tests.  You cannot remove and re-apply the ZIO XT patch monitor.   Your ZIO patch monitor will be sent USPS Priority mail from [facility] directly to your home address. The monitor may also be mailed to a PO BOX if home delivery is not available.   It may take 3-5 days to receive your monitor after you have been enrolled.   Once you have received you monitor, please review enclosed instructions.  Your monitor has already been registered assigning a specific monitor serial # to you.   Applying the monitor   Shave hair from upper left chest.   Hold abrader disc by orange tab.  Rub abrader in 40 strokes over left upper chest as indicated in your monitor instructions.   Clean area with 4 enclosed alcohol pads .  Use all pads to assure are is cleaned thoroughly.  Let dry.   Apply patch as indicated in monitor instructions.  Patch will be place under collarbone on left side of chest with arrow pointing upward.   Rub patch adhesive wings for 2 minutes.Remove white label marked "1".  Remove white label marked "2".  Rub patch adhesive wings for 2 additional minutes.   While looking in a mirror, press and release button in center of patch.  A small green light will flash 3-4 times .  This will be your only indicator the monitor has been turned on.     Do not shower  for the first 24 hours.  You may shower after the first 24 hours.   Press button if you feel a symptom. You will hear a small click.  Record Date, Time and Symptom in the Patient Log Book.   When you are ready to remove patch, follow instructions on last 2 pages of Patient Log Book.  Stick patch monitor onto last page of Patient Log Book.   Place Patient Log Book in [place] box.  Use locking tab on box and tape box closed securely.  The [employer] has [employer] on it.  Please place in mailbox as soon as possible.  Your physician should have your test results approximately 7 days after the monitor has been mailed back to [facility].   Call [place] at [phone] if you have questions regarding your ZIO XT patch monitor.  Call them immediately if you see an orange light blinking on your monitor.   If your monitor falls off in less than 4 days contact our Monitor department at [phone].  If your monitor becomes loose or falls off after 4 days call [employer] at [phone] for suggestions on securing your monitor.     Follow-Up: At [facility], you and your health needs are our priority.  As part of our continuing mission to provide you with exceptional heart care, we have created designated Provider Care Teams.  These Care  Teams include your primary Cardiologist (physician) and Advanced Practice Providers (APPs -  Physician Assistants and Nurse Practitioners) who all work together to provide you with the care you need, when you need it.  We recommend signing up for the patient portal called "MyChart".  Sign up information is provided on this After Visit Summary.  MyChart is used to connect with patients for Virtual Visits (Telemedicine).  Patients are able to view lab/test results, encounter notes, upcoming appointments, etc.  Non-urgent messages can be sent to your provider as well.   To learn more about what you can do with MyChart, go to  [email or web address].    Your next appointment:   6 month(s)  The format for your next appointment:   In Person  Provider:   Eleonore Chiquito, MD

## 2020-10-11 ENCOUNTER — Ambulatory Visit (INDEPENDENT_AMBULATORY_CARE_PROVIDER_SITE_OTHER): Payer: Medicare HMO | Admitting: Otolaryngology

## 2020-10-11 ENCOUNTER — Other Ambulatory Visit: Payer: Self-pay

## 2020-10-11 VITALS — Temp 97.5°F

## 2020-10-11 DIAGNOSIS — J310 Chronic rhinitis: Secondary | ICD-10-CM | POA: Diagnosis not present

## 2020-10-11 DIAGNOSIS — I483 Typical atrial flutter: Secondary | ICD-10-CM

## 2020-10-11 DIAGNOSIS — J342 Deviated nasal septum: Secondary | ICD-10-CM | POA: Diagnosis not present

## 2020-10-11 NOTE — Progress Notes (Signed)
HPI: Ronald Franklin is a 70 y.o. male who presents is referred by his PCP Dr. [name] for evaluation of "sinus symptoms".  This began after he got COVID toward the end of December.  He had bad sinus symptoms with congestion and trouble breathing through his nose as well as thick colored discharge from the nose.  He lost his sense of smell and taste although this is getting much better presently.  Likewise his nasal symptoms are doing much better and he is having no further colored discharge from his nose but still has some congestion of the left side compared to the right she had a longstanding septal deviation to the left. He has also been on nasal CPAP for over 15years.  He has occasionally used Flonase.  He also uses a Nettie pot that seems to help. He is doing much better presently and having minimal symptoms today..  Past Medical History:  Diagnosis Date  . Allergic rhinitis   . Arthritis   . CTS (carpal tunnel syndrome)   . Hypertension   . OSA (obstructive sleep apnea)   . Type II diabetes mellitus with manifestations ([place]) 09/20/2020   Past Surgical History:  Procedure Laterality Date  . BACK SURGERY     C4-5-6   2 cervical    from AA  . CARDIOVERSION N/A 09/20/2020   Procedure: CARDIOVERSION;  Surgeon: Sanda Klein, MD;  Location: [facility];  Service: Cardiovascular;  Laterality: N/A;  . CARPAL TUNNEL RELEASE  09/16/2011   Procedure: CARPAL TUNNEL RELEASE;  Surgeon: Floyce Stakes, MD;  Location: [facility];  Service: Neurosurgery;  Laterality: Left;  Left Carpal Tunnel Release  . CERVICAL LAMINECTOMY    . EXTRACORPOREAL SHOCK WAVE LITHOTRIPSY Left 09/16/2020   Procedure: EXTRACORPOREAL SHOCK WAVE LITHOTRIPSY (ESWL);  Surgeon: Raynelle Bring, MD;  Location[facility];  Service: Urology;  Laterality: Left;  . KNEE ARTHROSCOPY WITH MEDIAL MENISECTOMY  07/06/2012   Procedure: KNEE ARTHROSCOPY WITH MEDIAL MENISECTOMY;  Surgeon: Magnus Sinning, MD;  Location: [facility];  Service: Orthopedics;  Laterality: Left;  LEFT KNEE ARTHROSCOPY WITH PARTIAL MEDIAL MENISECTOMY  . KNEE SURGERY    . NECK SURGERY    . ROTATOR CUFF REPAIR     bilateral   . TEE WITHOUT CARDIOVERSION N/A 09/20/2020   Procedure: TRANSESOPHAGEAL ECHOCARDIOGRAM (TEE);  Surgeon: Sanda Klein, MD;  Location: [facility];  Service: Cardiovascular;  Laterality: N/A;  . TOTAL HIP ARTHROPLASTY Left 01/19/2019   Procedure: TOTAL HIP ARTHROPLASTY ANTERIOR APPROACH;  Surgeon: Rod Can, MD;  Location: [facility];  Service: Orthopedics;  Laterality: Left;   Social History   Socioeconomic History  . Marital status: Married    Spouse name: Not on file  . Number of children: 6  . Years of education: BS  . Highest education level: Not on file  Occupational History  . Occupation: Retired Special educational needs teacher  Tobacco Use  . Smoking status: Never Smoker  . Smokeless tobacco: Never Used  Substance and Sexual Activity  . Alcohol use: Yes    Alcohol/week: 5.0 standard drinks    Types: 5 Glasses of wine per week    Comment: socially  . Drug use: No  . Sexual activity: Yes    Birth control/protection: None  Other Topics Concern  . 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 Has children.   Drinks 1  cup of coffee a day    Social Determinants of Health   Financial Resource Strain: Not on file  Food Insecurity: Not on file  Transportation Needs: Not on file  Physical Activity: Not on file  Stress: Not on file  Social Connections: Not on file   Family History  Problem Relation Age of Onset  . Diabetes Brother   . Diabetes Brother   . Hyperlipidemia Other   . Prostate cancer Other   . Heart disease Neg Hx   . Early death Neg Hx   . Alcohol abuse Neg Hx   . Arthritis Neg Hx   . Cancer Neg Hx   . Stroke Neg Hx   . Kidney disease Neg Hx   . Hypertension Neg Hx    No Known  Allergies Prior to Admission medications   Medication Sig Start Date End Date Taking? Authorizing Provider  apixaban (ELIQUIS) 5 MG TABS tablet Take 1 tablet (5 mg total) by mouth 2 (two) times daily. 09/17/20   O'NealCassie Freer, MD  Ascorbic Acid (VITAMIN C) 1000 MG tablet Take 2,000 mg by mouth daily.    [provider]  Cholecalciferol (VITAMIN D-3) 125 MCG (5000 UT) TABS Take 5,000 Units by mouth daily.    [provider]  Menaquinone-7 (VITAMIN K2 PO) Take 1 tablet by mouth daily.    [provider]  Misc Natural Products (OSTEO BI-FLEX TRIPLE STRENGTH PO) Take 1 tablet by mouth daily.    [provider]  Multiple Vitamin (MULTIVITAMIN WITH MINERALS) TABS tablet Take 1 tablet by mouth daily. One-A-Day Men's Health    [provider]  Turmeric 500 MG CAPS Take 500 mg by mouth daily.     [provider]  vitamin E 400 UNIT capsule Take 800 Units by mouth daily.    [provider]  Zinc 50 MG TABS Take 50 mg by mouth daily.    [provider]     Positive ROS: Otherwise negative  All other systems have been reviewed and were otherwise negative with the exception of those mentioned in the HPI and as above.  Physical Exam: Constitutional: Alert, well-appearing, no acute distress Ears: External ears without lesions or tenderness. Ear canals are clear bilaterally with intact, clear TMs.  Nasal: External nose without lesions. Septum is slightly deviated to the left.  After decongesting the nose both the middle meatus regions are clear with no polyps minimal edema and no evidence of infection or mucopurulent discharge..  Nasal passages are clear bilaterally on exam today. Oral: Lips and gums without lesions. Tongue and palate mucosa without lesions. Posterior oropharynx clear. Neck: No palpable adenopathy or masses Respiratory: Breathing comfortably  Skin: No facial/neck lesions or rash  noted.  Procedures  Assessment: Recent bout of Covid with secondary sinusitis.  He is doing much better presently.  Plan: I discussed with him concerning continue use of Nettie pot as needed and also recommended regular use of Flonase 2 sprays each nostril at night as this will help some with nasal congestion and is safe to use over a prolonged period of time. He will follow-up if he develops any recurrent sinus issues.   Radene Journey, MD   CC:

## 2020-10-15 ENCOUNTER — Telehealth: Payer: Self-pay | Admitting: *Deleted

## 2020-10-15 NOTE — Telephone Encounter (Signed)
Patients 14 day ZIO XT patch monitor fell off in 4 days.  [employer] authorized to send patient redo 14 day ZIO XT patch monitor. No charge on initial monitor.

## 2020-10-29 ENCOUNTER — Telehealth: Payer: Self-pay | Admitting: Cardiovascular Disease

## 2020-10-29 MED ORDER — APIXABAN 5 MG PO TABS: 5.0000 mg | ORAL_TABLET | Freq: Two times a day (BID) | ORAL | 0 refills | Status: DC

## 2020-10-29 NOTE — Telephone Encounter (Signed)
Pt c/o medication issue:  1. Name of Medication: Blood Thinner   2. How are you currently taking this medication (dosage and times per day)?   3. Are you having a reaction (difficulty breathing--STAT)? Na   4. What is your medication issue? Pt is out of town until 3/21 and he will be out of the blood thinner on Sunday.  So  He just wants to make sure he will be ok from Sunday to Tuesday with no blood thinner?  He has his physical with his pcp on 3/22  Best number [phone]

## 2020-10-29 NOTE — Telephone Encounter (Signed)
Spoke with the patient. He is currently in [place] and has run out of Eliquis. A weeks worth has been sent in for him to the [facility] in [place].

## 2020-11-04 ENCOUNTER — Telehealth: Payer: Self-pay | Admitting: Internal Medicine

## 2020-11-04 NOTE — Telephone Encounter (Signed)
Patient was supposed to come in for a CPE tomorrow but his flight home was cancelled, he is wondering if when he returns he can go get his lab work done. He has a CPE rescheduled for 04.05.22

## 2020-11-05 ENCOUNTER — Encounter: Payer: Medicare HMO | Admitting: Internal Medicine

## 2020-11-05 ENCOUNTER — Ambulatory Visit: Payer: Medicare HMO

## 2020-11-06 ENCOUNTER — Other Ambulatory Visit: Payer: Self-pay | Admitting: Internal Medicine

## 2020-11-06 ENCOUNTER — Other Ambulatory Visit (INDEPENDENT_AMBULATORY_CARE_PROVIDER_SITE_OTHER): Payer: Medicare HMO

## 2020-11-06 ENCOUNTER — Encounter: Payer: Self-pay | Admitting: Internal Medicine

## 2020-11-06 DIAGNOSIS — E785 Hyperlipidemia, unspecified: Secondary | ICD-10-CM

## 2020-11-06 DIAGNOSIS — E118 Type 2 diabetes mellitus with unspecified complications: Secondary | ICD-10-CM

## 2020-11-06 DIAGNOSIS — I10 Essential (primary) hypertension: Secondary | ICD-10-CM

## 2020-11-06 DIAGNOSIS — N400 Enlarged prostate without lower urinary tract symptoms: Secondary | ICD-10-CM | POA: Diagnosis not present

## 2020-11-06 LAB — CBC WITH DIFFERENTIAL/PLATELET
Basophils Absolute: 0 10*3/uL (ref 0.0–0.1)
Basophils Relative: 0.6 % (ref 0.0–3.0)
Eosinophils Absolute: 0.3 10*3/uL (ref 0.0–0.7)
Eosinophils Relative: 4.8 % (ref 0.0–5.0)
HCT: 39.8 % (ref 39.0–52.0)
Hemoglobin: 13.4 g/dL (ref 13.0–17.0)
Lymphocytes Relative: 44.7 % (ref 12.0–46.0)
Lymphs Abs: 3 10*3/uL (ref 0.7–4.0)
MCHC: 33.7 g/dL (ref 30.0–36.0)
MCV: 92.2 fl (ref 78.0–100.0)
Monocytes Absolute: 0.7 10*3/uL (ref 0.1–1.0)
Monocytes Relative: 10.7 % (ref 3.0–12.0)
Neutro Abs: 2.6 10*3/uL (ref 1.4–7.7)
Neutrophils Relative %: 39.2 % — ABNORMAL LOW (ref 43.0–77.0)
Platelets: 182 10*3/uL (ref 150.0–400.0)
RBC: 4.32 Mil/uL (ref 4.22–5.81)
RDW: 14.2 % (ref 11.5–15.5)
WBC: 6.7 10*3/uL (ref 4.0–10.5)

## 2020-11-06 LAB — LIPID PANEL
Cholesterol: 258 mg/dL — ABNORMAL HIGH (ref 0–200)
HDL: 44.8 mg/dL (ref >39.00)
LDL Cholesterol: 185 mg/dL — ABNORMAL HIGH (ref 0–99)
NonHDL: 213.26
Total CHOL/HDL Ratio: 6
Triglycerides: 140 mg/dL (ref 0.0–149.0)
VLDL: 28 mg/dL (ref 0.0–40.0)

## 2020-11-06 LAB — BASIC METABOLIC PANEL
BUN: 15 mg/dL (ref 6–23)
CO2: 29 mEq/L (ref 19–32)
Calcium: 9.5 mg/dL (ref 8.4–10.5)
Chloride: 104 mEq/L (ref 96–112)
Creatinine, Ser: 0.87 mg/dL (ref 0.40–1.50)
GFR: 87.18 mL/min (ref >60.00)
Glucose, Bld: 92 mg/dL (ref 70–99)
Potassium: 3.9 mEq/L (ref 3.5–5.1)
Sodium: 141 mEq/L (ref 135–145)

## 2020-11-06 LAB — PSA: PSA: 1.64 ng/mL (ref 0.10–4.00)

## 2020-11-06 LAB — HEMOGLOBIN A1C: Hgb A1c MFr Bld: 5.8 % (ref 4.6–6.5)

## 2020-11-07 ENCOUNTER — Encounter: Payer: Self-pay | Admitting: Internal Medicine

## 2020-11-11 MED ORDER — ROSUVASTATIN CALCIUM 20 MG PO TABS: 20.0000 mg | ORAL_TABLET | Freq: Every day | ORAL | 1 refills | Status: DC

## 2020-11-11 NOTE — Addendum Note (Signed)
Addended by: Janith Lima on: 11/11/2020 07:50 AM   Modules accepted: Orders

## 2020-11-19 ENCOUNTER — Ambulatory Visit (INDEPENDENT_AMBULATORY_CARE_PROVIDER_SITE_OTHER): Payer: Medicare HMO | Admitting: Internal Medicine

## 2020-11-19 ENCOUNTER — Encounter: Payer: Self-pay | Admitting: Internal Medicine

## 2020-11-19 ENCOUNTER — Ambulatory Visit (INDEPENDENT_AMBULATORY_CARE_PROVIDER_SITE_OTHER): Payer: Medicare HMO

## 2020-11-19 ENCOUNTER — Other Ambulatory Visit: Payer: Self-pay

## 2020-11-19 VITALS — BP 122/68 | HR 88 | Temp 98.4°F | Ht 72.0 in | Wt 208.0 lb

## 2020-11-19 DIAGNOSIS — E785 Hyperlipidemia, unspecified: Secondary | ICD-10-CM | POA: Diagnosis not present

## 2020-11-19 DIAGNOSIS — Z0000 Encounter for general adult medical examination without abnormal findings: Secondary | ICD-10-CM

## 2020-11-19 DIAGNOSIS — R739 Hyperglycemia, unspecified: Secondary | ICD-10-CM

## 2020-11-19 DIAGNOSIS — I10 Essential (primary) hypertension: Secondary | ICD-10-CM | POA: Diagnosis not present

## 2020-11-19 DIAGNOSIS — I4819 Other persistent atrial fibrillation: Secondary | ICD-10-CM | POA: Diagnosis not present

## 2020-11-19 MED ORDER — ROSUVASTATIN CALCIUM 20 MG PO TABS: 20.0000 mg | ORAL_TABLET | Freq: Every day | ORAL | 1 refills | Status: DC

## 2020-11-19 NOTE — Progress Notes (Signed)
Subjective:   Ronald Franklin is a 70 y.o. male who presents for Medicare Annual/Subsequent preventive examination.  Review of Systems    No ROS. Medicare Wellness Visit. Additional risk factors are reflected in social history. Cardiac Risk Factors include: advanced age (>75men, >74 women);hypertension;male gender    Objective:    Today's Vitals   11/19/20 1410  BP: 122/68  Pulse: 88  Temp: 98.4 F (36.9 C)  SpO2: 98%  Weight: 208 lb (94.3 kg)  Height: 6' (1.829 m)   Body mass index is 28.21 kg/m.  Advanced Directives 11/19/2020 09/20/2020 09/16/2020 08/31/2020 01/19/2019 01/16/2019 10/24/2016  Does Patient Have a Medical Advance Directive? Yes Yes Yes No No No Yes  Type of Advance Directive Living will;Healthcare Power of [place];Living will - - - [place];Living will  Does patient want to make changes to medical advance directive? No - Patient declined - - - - - -  Copy of [place] in Chart? No - copy requested - No - copy requested - - - No - copy requested  Would patient like information on creating a medical advance directive? - - - - No - Patient declined - -  Pre-existing out of facility DNR order (yellow form or pink MOST form) - - - - - - -    Current Medications (verified) Outpatient Encounter Medications as of 11/19/2020  Medication Sig  . apixaban (ELIQUIS) 5 MG TABS tablet Take 1 tablet (5 mg total) by mouth 2 (two) times daily. (Patient not taking: Reported on 11/19/2020)  . Ascorbic Acid (VITAMIN C) 1000 MG tablet Take 2,000 mg by mouth daily.  . Cholecalciferol (VITAMIN D-3) 125 MCG (5000 UT) TABS Take 5,000 Units by mouth daily.  . Menaquinone-7 (VITAMIN K2 PO) Take 1 tablet by mouth daily.  . Misc Natural Products (OSTEO BI-FLEX TRIPLE STRENGTH PO) Take 1 tablet by mouth daily.  . Multiple Vitamin (MULTIVITAMIN WITH MINERALS) TABS tablet Take 1 tablet by mouth daily. One-A-Day Men's Health  .  Turmeric 500 MG CAPS Take 500 mg by mouth daily.   . vitamin E 400 UNIT capsule Take 800 Units by mouth daily.  . Zinc 50 MG TABS Take 50 mg by mouth daily.  . [DISCONTINUED] rosuvastatin (CRESTOR) 20 MG tablet Take 1 tablet (20 mg total) by mouth daily. (Patient not taking: Reported on 11/19/2020)   No facility-administered encounter medications on file as of 11/19/2020.    Allergies (verified) Patient has no known allergies.   History: Past Medical History:  Diagnosis Date  . Allergic rhinitis   . Arthritis   . CTS (carpal tunnel syndrome)   . Hypertension   . OSA (obstructive sleep apnea)   . Type II diabetes mellitus with manifestations ([place]) 09/20/2020   Past Surgical History:  Procedure Laterality Date  . BACK SURGERY     C4-5-6   2 cervical    from AA  . CARDIOVERSION N/A 09/20/2020   Procedure: CARDIOVERSION;  Surgeon: Sanda Klein, MD;  Location: [facility];  Service: Cardiovascular;  Laterality: N/A;  . CARPAL TUNNEL RELEASE  09/16/2011   Procedure: CARPAL TUNNEL RELEASE;  Surgeon: Floyce Stakes, MD;  Location: [facility];  Service: Neurosurgery;  Laterality: Left;  Left Carpal Tunnel Release  . CERVICAL LAMINECTOMY    . EXTRACORPOREAL SHOCK WAVE LITHOTRIPSY Left 09/16/2020   Procedure: EXTRACORPOREAL SHOCK WAVE LITHOTRIPSY (ESWL);  Surgeon: Raynelle Bring, MD;  Location[facility];  Service: Urology;  Laterality: Left;  . KNEE ARTHROSCOPY WITH MEDIAL MENISECTOMY  07/06/2012   Procedure: KNEE ARTHROSCOPY WITH MEDIAL MENISECTOMY;  Surgeon: Magnus Sinning, MD;  Location: [facility];  Service: Orthopedics;  Laterality: Left;  LEFT KNEE ARTHROSCOPY WITH PARTIAL MEDIAL MENISECTOMY  . KNEE SURGERY    . NECK SURGERY    . ROTATOR CUFF REPAIR     bilateral   . TEE WITHOUT CARDIOVERSION N/A 09/20/2020   Procedure: TRANSESOPHAGEAL ECHOCARDIOGRAM (TEE);  Surgeon: Sanda Klein, MD;  Location: [facility];  Service: Cardiovascular;  Laterality: N/A;  . TOTAL HIP  ARTHROPLASTY Left 01/19/2019   Procedure: TOTAL HIP ARTHROPLASTY ANTERIOR APPROACH;  Surgeon: Rod Can, MD;  Location: [facility];  Service: Orthopedics;  Laterality: Left;   Family History  Problem Relation Age of Onset  . Diabetes Brother   . Diabetes Brother   . Hyperlipidemia Other   . Prostate cancer Other   . Heart disease Neg Hx   . Early death Neg Hx   . Alcohol abuse Neg Hx   . Arthritis Neg Hx   . Cancer Neg Hx   . Stroke Neg Hx   . Kidney disease Neg Hx   . Hypertension Neg Hx    Social History   Socioeconomic History  . Marital status: Married    Spouse name: Not on file  . Number of children: 6  . Years of education: BS  . Highest education level: Not on file  Occupational History  . Occupation: Retired Special educational needs teacher  Tobacco Use  . Smoking status: Never Smoker  . Smokeless tobacco: Never Used  Substance and Sexual Activity  . Alcohol use: Yes    Alcohol/week: 5.0 standard drinks    Types: 5 Glasses of wine per week    Comment: socially  . Drug use: No  . Sexual activity: Yes    Birth control/protection: None  Other Topics Concern  . 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 Has children.   Drinks 1 cup of coffee a day    Social Determinants of Health   Financial Resource Strain: Low Risk   . Difficulty of Paying Living Expenses: Not hard at all  Food Insecurity: No Food Insecurity  . Worried About Charity fundraiser in the Last Year: Never true  . Ran Out of Food in the Last Year: Never true  Transportation Needs: No Transportation Needs  . Lack of Transportation (Medical): No  . Lack of Transportation (Non-Medical): No  Physical Activity: Sufficiently Active  . Days of Exercise per Week: 5 days  . Minutes of Exercise per Session: 30 min  Stress: No Stress Concern Present  . Feeling of Stress : Not at all  Social Connections:  Socially Integrated  . Frequency of Communication with Friends and Family: More than three times a week  . Frequency of Social Gatherings with Friends and Family: Not on file  . Attends Religious Services: More than 4 times per year  . Active Member of Clubs or Organizations: Yes  . Attends Archivist Meetings: More than 4 times per year  . Marital Status: Married    Tobacco Counseling Counseling given: Not Answered   Clinical Intake:  Pre-visit preparation completed: Yes  Pain : No/denies pain     BMI - recorded: 28.21 Nutritional Status: BMI 25 -29 Overweight Nutritional Risks: None Diabetes: No  How often do you need to have someone help you when you read instructions, pamphlets, or other written materials from your doctor or pharmacy?: 1 - Never What is the last grade level you completed in school?: Bachelor's Degree; retired from [employer]  Diabetic? no  Interpreter Needed?: No  Information entered by :: Lisette Abu, LPN   Activities of Daily Living In your present state of health, do you have any difficulty performing the following activities: 11/19/2020 09/18/2020  Hearing? Y N  Comment wears hearing aids -  Vision? N N  Difficulty concentrating or making decisions? N N  Walking or climbing stairs? N N  Dressing or bathing? N N  Doing errands, shopping? N N  Preparing Food and eating ? N -  Using the Toilet? N -  In the past six months, have you accidently leaked urine? N -  Do you have problems with loss of bowel control? N -  Managing your Medications? N -  Managing your Finances? N -  Housekeeping or managing your Housekeeping? N -  Some recent data might be hidden    Patient Care Team: Janith Lima, MD as PCP - General (Internal Medicine)  Indicate any recent Medical Services you may have received from other than [facility] providers in the past year (date may be approximate).     Assessment:   This is a routine wellness examination  for Kacy.  Hearing/Vision screen No exam data present  Dietary issues and exercise activities discussed: Current Exercise Habits: Home exercise routine, Type of exercise: walking, Time (Minutes): 30, Frequency (Times/Week): 5, Weekly Exercise (Minutes/Week): 150, Intensity: Moderate, Exercise limited by: cardiac condition(s);orthopedic condition(s)  Goals   None    Depression Screen PHQ 2/9 Scores 11/19/2020 10/30/2019 10/29/2018 10/27/2018 10/27/2017 10/25/2017 10/24/2016  PHQ - 2 Score 0 0 0 0 0 0 0    Fall Risk Fall Risk  11/19/2020 10/30/2019 10/29/2018 10/27/2018 10/27/2017  Falls in the past year? 0 0 0 0 No  Number falls in past yr: 0 0 - 0 -  Injury with Fall? 0 0 - 0 -  Follow up - Falls evaluation completed - Falls evaluation completed -    FALL RISK PREVENTION PERTAINING TO THE HOME:  Any stairs in or around the home? Yes  If so, are there any without handrails? No  Home free of loose throw rugs in walkways, pet beds, electrical cords, etc? Yes  Adequate lighting in your home to reduce risk of falls? Yes   ASSISTIVE DEVICES UTILIZED TO PREVENT FALLS:  Life alert? No  Use of a cane, walker or w/c? No  Grab bars in the bathroom? No  Shower chair or bench in shower? No  Elevated toilet seat or a handicapped toilet? Yes   TIMED UP AND GO:  Was the test performed? No .  Length of time to ambulate 10 feet: 0 sec.   Gait steady and fast without use of assistive device  Cognitive Function: Normal cognitive status assessed by direct observation by this Nurse Health Advisor. No abnormalities found.          Immunizations Immunization History  Administered Date(s) Administered  . Influenza Split 05/28/2011  . Pneumococcal Conjugate-13 10/07/2015  . Pneumococcal Polysaccharide-23 10/22/2016  . Td 08/17/2005  . Tdap 10/07/2015  . Zoster Recombinat (Shingrix) 08/18/2019    TDAP status: Up to date  Flu Vaccine status: Declined, Education has been provided regarding  the importance of this vaccine but patient still declined. Advised may  receive this vaccine at local pharmacy or Health Dept. Aware to provide a copy of the vaccination record if obtained from local pharmacy or Health Dept. Verbalized acceptance and understanding.  Pneumococcal vaccine status: Up to date  Covid-19 vaccine status: Declined, Education has been provided regarding the importance of this vaccine but patient still declined. Advised may receive this vaccine at local pharmacy or Health Dept.or vaccine clinic. Aware to provide a copy of the vaccination record if obtained from local pharmacy or Health Dept. Verbalized acceptance and understanding.  Qualifies for Shingles Vaccine? Yes   Zostavax completed Yes   Shingrix Completed?: No.    Education has been provided regarding the importance of this vaccine. Patient has been advised to call insurance company to determine out of pocket expense if they have not yet received this vaccine. Advised may also receive vaccine at local pharmacy or Health Dept. Verbalized acceptance and understanding.  Screening Tests Health Maintenance  Topic Date Due  . COVID-19 Vaccine (1) Never done  . FOOT EXAM  Never done  . OPHTHALMOLOGY EXAM  Never done  . URINE MICROALBUMIN  Never done  . INFLUENZA VACCINE  03/17/2021  . HEMOGLOBIN A1C  05/09/2021  . COLONOSCOPY (Pts 45-41yrs Insurance coverage will need to be confirmed)  07/06/2023  . TETANUS/TDAP  10/06/2025  . Hepatitis C Screening  Completed  . PNA vac Low Risk Adult  Completed  . HPV VACCINES  Aged Out    Health Maintenance  Health Maintenance Due  Topic Date Due  . COVID-19 Vaccine (1) Never done  . FOOT EXAM  Never done  . OPHTHALMOLOGY EXAM  Never done  . URINE MICROALBUMIN  Never done    Colorectal cancer screening: Type of screening: Colonoscopy. Completed 07/05/2013. Repeat every 10 years  Lung Cancer Screening: (Low Dose CT Chest recommended if Age 74-80 years, 30 pack-year  currently smoking OR have quit w/in 15years.) does not qualify.   Lung Cancer Screening Referral: no  Additional Screening:  Hepatitis C Screening: does qualify; Completed yes  Vision Screening: Recommended annual ophthalmology exams for early detection of glaucoma and other disorders of the eye. Is the patient up to date with their annual eye exam?  Yes  Who is the provider or what is the name of the office in which the patient attends annual eye exams? Staci A. Palmer O.D. If pt is not established with a provider, would they like to be referred to a provider to establish care? No .   Dental Screening: Recommended annual dental exams for proper oral hygiene  Community Resource Referral / Chronic Care Management: CRR required this visit?  No   CCM required this visit?  No      Plan:     I have personally reviewed and noted the following in the patient's chart:   . Medical and social history . Use of alcohol, tobacco or illicit drugs  . Current medications and supplements . Functional ability and status . Nutritional status . Physical activity . Advanced directives . List of other physicians . Hospitalizations, surgeries, and ER visits in previous 12 months . Vitals . Screenings to include cognitive, depression, and falls . Referrals and appointments  In addition, I have reviewed and discussed with patient certain preventive protocols, quality metrics, and best practice recommendations. A written personalized care plan for preventive services as well as general preventive health recommendations were provided to patient.     Sheral Flow, LPN   10/22/9022   Nurse Notes:  Medications reviewed with patient; no opioid use noted.

## 2020-11-19 NOTE — Patient Instructions (Signed)
[name] , Thank you for taking time to come for your Medicare Wellness Visit. I appreciate your ongoing commitment to your health goals. Please review the following plan we discussed and let me know if I can assist you in the future.   Screening recommendations/referrals: Colonoscopy: 07/05/2013; due every 10 years Recommended yearly ophthalmology/optometry visit for glaucoma screening and checkup Recommended yearly dental visit for hygiene and checkup  Vaccinations: Influenza vaccine: declined Pneumococcal vaccine: 10/07/2015, 10/22/2016 Tdap vaccine: 10/07/2015; due every 10 years Shingles vaccine:  08/18/2019; need second dose Covid-19: declined  Advanced directives: Please bring a copy of your health care power of attorney and living will to the office at your convenience.  Conditions/risks identified: Yes; Reviewed health maintenance screenings with patient today and relevant education, vaccines, and/or referrals were provided. Please continue to do your personal lifestyle choices by: daily care of teeth and gums, regular physical activity (goal should be 5 days a week for 30 minutes), eat a healthy diet, avoid tobacco and drug use, limiting any alcohol intake, taking a low-dose aspirin (if not allergic or have been advised by your provider otherwise) and taking vitamins and minerals as recommended by your provider. Continue doing brain stimulating activities (puzzles, reading, adult coloring books, staying active) to keep memory sharp. Continue to eat heart healthy diet (full of fruits, vegetables, whole grains, lean protein, water--limit salt, fat, and sugar intake) and increase physical activity as tolerated.  Next appointment: Please schedule your next Medicare Wellness Visit with your Nurse Health Advisor in 1 year by calling [phone].  Preventive Care 71 Years and Older, Male Preventive care refers to lifestyle choices and visits with your health care provider that can promote health  and wellness. What does preventive care include?  A yearly physical exam. This is also called an annual well check.  Dental exams once or twice a year.  Routine eye exams. Ask your health care provider how often you should have your eyes checked.  Personal lifestyle choices, including:  Daily care of your teeth and gums.  Regular physical activity.  Eating a healthy diet.  Avoiding tobacco and drug use.  Limiting alcohol use.  Practicing safe sex.  Taking low doses of aspirin every day.  Taking vitamin and mineral supplements as recommended by your health care provider. What happens during an annual well check? The services and screenings done by your health care provider during your annual well check will depend on your age, overall health, lifestyle risk factors, and family history of disease. Counseling  Your health care provider may ask you questions about your:  Alcohol use.  Tobacco use.  Drug use.  Emotional well-being.  Home and relationship well-being.  Sexual activity.  Eating habits.  History of falls.  Memory and ability to understand (cognition).  Work and work Statistician. Screening  You may have the following tests or measurements:  Height, weight, and BMI.  Blood pressure.  Lipid and cholesterol levels. These may be checked every 5 years, or more frequently if you are over 71 years old.  Skin check.  Lung cancer screening. You may have this screening every year starting at age 5 if you have a 30-pack-year history of smoking and currently smoke or have quit within the past 15 years.  Fecal occult blood test (FOBT) of the stool. You may have this test every year starting at age 71.  Flexible sigmoidoscopy or colonoscopy. You may have a sigmoidoscopy every 5 years or a colonoscopy every 10 years starting at  age 71.  Prostate cancer screening. Recommendations will vary depending on your family history and other risks.  Hepatitis C  blood test.  Hepatitis B blood test.  Sexually transmitted disease (STD) testing.  Diabetes screening. This is done by checking your blood sugar (glucose) after you have not eaten for a while (fasting). You may have this done every 1-3 years.  Abdominal aortic aneurysm (AAA) screening. You may need this if you are a current or former smoker.  Osteoporosis. You may be screened starting at age 48 if you are at high risk. Talk with your health care provider about your test results, treatment options, and if necessary, the need for more tests. Vaccines  Your health care provider may recommend certain vaccines, such as:  Influenza vaccine. This is recommended every year.  Tetanus, diphtheria, and acellular pertussis (Tdap, Td) vaccine. You may need a Td booster every 10 years.  Zoster vaccine. You may need this after age 71.  Pneumococcal 13-valent conjugate (PCV13) vaccine. One dose is recommended after age 71.  Pneumococcal polysaccharide (PPSV23) vaccine. One dose is recommended after age 71. Talk to your health care provider about which screenings and vaccines you need and how often you need them. This information is not intended to replace advice given to you by your health care provider. Make sure you discuss any questions you have with your health care provider. Document Released: 08/30/2015 Document Revised: 04/22/2016 Document Reviewed: 06/04/2015 Elsevier Interactive Patient Education  2017 [place] Prevention in the Home Falls can cause injuries. They can happen to people of all ages. There are many things you can do to make your home safe and to help prevent falls. What can I do on the outside of my home?  Regularly fix the edges of walkways and driveways and fix any cracks.  Remove anything that might make you trip as you walk through a door, such as a raised step or threshold.  Trim any bushes or trees on the path to your home.  Use bright outdoor  lighting.  Clear any walking paths of anything that might make someone trip, such as rocks or tools.  Regularly check to see if handrails are loose or broken. Make sure that both sides of any steps have handrails.  Any raised decks and porches should have guardrails on the edges.  Have any leaves, snow, or ice cleared regularly.  Use sand or salt on walking paths during winter.  Clean up any spills in your garage right away. This includes oil or grease spills. What can I do in the bathroom?  Use night lights.  Install grab bars by the toilet and in the tub and shower. Do not use towel bars as grab bars.  Use non-skid mats or decals in the tub or shower.  If you need to sit down in the shower, use a plastic, non-slip stool.  Keep the floor dry. Clean up any water that spills on the floor as soon as it happens.  Remove soap buildup in the tub or shower regularly.  Attach bath mats securely with double-sided non-slip rug tape.  Do not have throw rugs and other things on the floor that can make you trip. What can I do in the bedroom?  Use night lights.  Make sure that you have a light by your bed that is easy to reach.  Do not use any sheets or blankets that are too big for your bed. They should not hang down onto the  floor.  Have a firm chair that has side arms. You can use this for support while you get dressed.  Do not have throw rugs and other things on the floor that can make you trip. What can I do in the kitchen?  Clean up any spills right away.  Avoid walking on wet floors.  Keep items that you use a lot in easy-to-reach places.  If you need to reach something above you, use a strong step stool that has a grab bar.  Keep electrical cords out of the way.  Do not use floor polish or wax that makes floors slippery. If you must use wax, use non-skid floor wax.  Do not have throw rugs and other things on the floor that can make you trip. What can I do with my  stairs?  Do not leave any items on the stairs.  Make sure that there are handrails on both sides of the stairs and use them. Fix handrails that are broken or loose. Make sure that handrails are as long as the stairways.  Check any carpeting to make sure that it is firmly attached to the stairs. Fix any carpet that is loose or worn.  Avoid having throw rugs at the top or bottom of the stairs. If you do have throw rugs, attach them to the floor with carpet tape.  Make sure that you have a light switch at the top of the stairs and the bottom of the stairs. If you do not have them, ask someone to add them for you. What else can I do to help prevent falls?  Wear shoes that:  Do not have high heels.  Have rubber bottoms.  Are comfortable and fit you well.  Are closed at the toe. Do not wear sandals.  If you use a stepladder:  Make sure that it is fully opened. Do not climb a closed stepladder.  Make sure that both sides of the stepladder are locked into place.  Ask someone to hold it for you, if possible.  Clearly mark and make sure that you can see:  Any grab bars or handrails.  First and last steps.  Where the edge of each step is.  Use tools that help you move around (mobility aids) if they are needed. These include:  Canes.  Walkers.  Scooters.  Crutches.  Turn on the lights when you go into a dark area. Replace any light bulbs as soon as they burn out.  Set up your furniture so you have a clear path. Avoid moving your furniture around.  If any of your floors are uneven, fix them.  If there are any pets around you, be aware of where they are.  Review your medicines with your doctor. Some medicines can make you feel dizzy. This can increase your chance of falling. Ask your doctor what other things that you can do to help prevent falls. This information is not intended to replace advice given to you by your health care provider. Make sure you discuss any  questions you have with your health care provider. Document Released: 05/30/2009 Document Revised: 01/09/2016 Document Reviewed: 09/07/2014 Elsevier Interactive Patient Education  2017 [employer].

## 2020-11-19 NOTE — Progress Notes (Signed)
Subjective:  Patient ID: [name], male    DOB: [birth year]  Age: 70 y.o. MRN: [id number]  CC: Annual Exam (physical)  This visit occurred during the SARS-CoV-2 public health emergency.  Safety protocols were in place, including screening questions prior to the visit, additional usage of staff PPE, and extensive cleaning of exam room while observing appropriate contact time as indicated for disinfecting solutions.    HPI Antwon Rochin Arenson presents for a CPX and f/up -   He is active and denies CP, DOE, palpitations, edema, or fatigue.  Outpatient Medications Prior to Visit  Medication Sig Dispense Refill  . Ascorbic Acid (VITAMIN C) 1000 MG tablet Take 2,000 mg by mouth daily.    . Cholecalciferol (VITAMIN D-3) 125 MCG (5000 UT) TABS Take 5,000 Units by mouth daily.    . Menaquinone-7 (VITAMIN K2 PO) Take 1 tablet by mouth daily.    . Misc Natural Products (OSTEO BI-FLEX TRIPLE STRENGTH PO) Take 1 tablet by mouth daily.    . Multiple Vitamin (MULTIVITAMIN WITH MINERALS) TABS tablet Take 1 tablet by mouth daily. One-A-Day Men's Health    . Turmeric 500 MG CAPS Take 500 mg by mouth daily.     . vitamin E 400 UNIT capsule Take 800 Units by mouth daily.    . Zinc 50 MG TABS Take 50 mg by mouth daily.    Marland Kitchen apixaban (ELIQUIS) 5 MG TABS tablet Take 1 tablet (5 mg total) by mouth 2 (two) times daily. (Patient not taking: Reported on 11/19/2020) 14 tablet 0  . rosuvastatin (CRESTOR) 20 MG tablet Take 1 tablet (20 mg total) by mouth daily. (Patient not taking: Reported on 11/19/2020) 90 tablet 1   No facility-administered medications prior to visit.    ROS Review of Systems  Constitutional: Negative.  Negative for appetite change, diaphoresis and fatigue.  HENT: Negative.   Eyes: Negative for visual disturbance.  Respiratory: Positive for apnea. Negative for cough, shortness of breath and wheezing.   Cardiovascular: Negative for chest pain and leg swelling.  Gastrointestinal: Negative for  abdominal pain, constipation, diarrhea, nausea and vomiting.  Endocrine: Negative.   Genitourinary: Negative.  Negative for difficulty urinating.  Musculoskeletal: Positive for arthralgias. Negative for myalgias.  Skin: Negative.   Neurological: Negative.  Negative for dizziness and weakness.  Hematological: Negative for adenopathy. Does not bruise/bleed easily.  Psychiatric/Behavioral: Negative.     Objective:  BP 122/68 (BP Location: Right Arm, Patient Position: Sitting, Cuff Size: Large)   Pulse 88   Temp 98.4 F (36.9 C) (Oral)   Ht 6' (1.829 m)   Wt 208 lb (94.3 kg)   SpO2 98%   BMI 28.21 kg/m   BP Readings from Last 3 Encounters:  11/19/20 122/68  11/19/20 122/68  10/08/20 132/72    Wt Readings from Last 3 Encounters:  11/19/20 208 lb (94.3 kg)  11/19/20 208 lb (94.3 kg)  10/08/20 202 lb 6.4 oz (91.8 kg)    Physical Exam Vitals reviewed.  Constitutional:      Appearance: Normal appearance.  HENT:     Nose: Nose normal.     Mouth/Throat:     Mouth: Mucous membranes are moist.  Eyes:     General: No scleral icterus.    Conjunctiva/sclera: Conjunctivae normal.  Cardiovascular:     Rate and Rhythm: Normal rate and regular rhythm.     Heart sounds: No murmur heard.   Pulmonary:     Effort: Pulmonary effort is normal.  Breath sounds: No stridor. No wheezing, rhonchi or rales.  Abdominal:     General: Abdomen is protuberant. Bowel sounds are normal.     Palpations: There is no hepatomegaly, splenomegaly or mass.     Tenderness: There is no abdominal tenderness.  Musculoskeletal:        General: Normal range of motion.     Cervical back: Neck supple.     Right lower leg: No edema.     Left lower leg: No edema.  Skin:    General: Skin is warm and dry.     Findings: No rash.  Neurological:     General: No focal deficit present.     Mental Status: He is alert.  Psychiatric:        Mood and Affect: Mood normal.        Behavior: Behavior normal.      Lab Results  Component Value Date   WBC 6.7 11/06/2020   HGB 13.4 11/06/2020   HCT 39.8 11/06/2020   PLT 182.0 11/06/2020   GLUCOSE 92 11/06/2020   CHOL 258 (H) 11/06/2020   TRIG 140.0 11/06/2020   HDL 44.80 11/06/2020   LDLDIRECT 206.6 08/04/2013   LDLCALC 185 (H) 11/06/2020   ALT 57 (H) 09/18/2020   AST 27 09/18/2020   NA 141 11/06/2020   K 3.9 11/06/2020   CL 104 11/06/2020   CREATININE 0.87 11/06/2020   BUN 15 11/06/2020   CO2 29 11/06/2020   TSH 0.680 09/17/2020   PSA 1.64 11/06/2020   INR 1.0 07/02/2008   HGBA1C 5.8 11/06/2020    ECHO TEE  Result Date: 09/20/2020    TRANSESOPHOGEAL ECHO REPORT   Patient Name:   ALEC JAROS Date of Exam: 09/20/2020 Medical Rec #:  [id number]       Height:       72.0 in Accession #:    [id number]      Weight:       205.0 lb Date of Birth:  [birth year]       BSA:          2.153 m Patient Age:    22 years        BP:           124/75 mmHg Patient Gender: M               HR:           119 bpm. Exam Location:  Outpatient Procedure: Transesophageal Echo, Color Doppler and Cardiac Doppler Indications:     atrial fibrillation  History:         Patient has no prior history of Echocardiogram examinations.                  Arrythmias:Atrial Fibrillation; Risk Factors:Sleep Apnea and                  Dyslipidemia.  Sonographer:     Johny Chess Referring Phys:  [id number] Davy Diagnosing Phys: Sanda Klein MD PROCEDURE: After discussion of the risks and benefits of a TEE, an informed consent was obtained from the patient. The transesophogeal probe was passed without difficulty through the esophogus of the patient. Sedation performed by different physician. The patient was monitored while under deep sedation. Anesthestetic sedation was provided intravenously by Anesthesiology: 192mg  of Propofol, 20mg  of Lidocaine. The patient's vital signs; including heart rate, blood pressure, and oxygen saturation; remained stable throughout the  procedure. The patient developed no complications during the  procedure. A direct current cardioversion was performed. IMPRESSIONS  1. Left ventricular ejection fraction, by estimation, is 55 to 60%. The left ventricle has normal function. The left ventricle has no regional wall motion abnormalities. Left ventricular diastolic function could not be evaluated.  2. Right ventricular systolic function is normal. The right ventricular size is mildly enlarged. There is mildly elevated pulmonary artery systolic pressure.  3. No left atrial/left atrial appendage thrombus was detected. The LAA emptying velocity was 60 cm/s.  4. The mitral valve is normal in structure. Trivial mitral valve regurgitation. No evidence of mitral stenosis.  5. Tricuspid valve regurgitation is mild to moderate.  6. The aortic valve is normal in structure. Aortic valve regurgitation is not visualized. No aortic stenosis is present.  7. The inferior vena cava is normal in size with greater than 50% respiratory variability, suggesting right atrial pressure of 3 mmHg. Conclusion(s)/Recommendation(s): Normal biventricular function without evidence of hemodynamically significant valvular heart disease. FINDINGS  Left Ventricle: Left ventricular ejection fraction, by estimation, is 55 to 60%. The left ventricle has normal function. The left ventricle has no regional wall motion abnormalities. The left ventricular internal cavity size was normal in size. There is  no left ventricular hypertrophy. Left ventricular diastolic function could not be evaluated due to atrial fibrillation. Left ventricular diastolic function could not be evaluated. Right Ventricle: The right ventricular size is mildly enlarged. No increase in right ventricular wall thickness. Right ventricular systolic function is normal. There is mildly elevated pulmonary artery systolic pressure. The tricuspid regurgitant velocity is 2.85 m/s, and with an assumed right atrial pressure of 3  mmHg, the estimated right ventricular systolic pressure is 09.9 mmHg. Left Atrium: Left atrial size was normal in size. Spontaneous echo contrast was present in the left atrium. No left atrial/left atrial appendage thrombus was detected. The LAA emptying velocity was 60 cm/s. Right Atrium: Right atrial size was normal in size. Pericardium: There is no evidence of pericardial effusion. Mitral Valve: The mitral valve is normal in structure. Trivial mitral valve regurgitation. No evidence of mitral valve stenosis. Tricuspid Valve: The tricuspid valve is normal in structure. Tricuspid valve regurgitation is mild to moderate. No evidence of tricuspid stenosis. Aortic Valve: The aortic valve is normal in structure. Aortic valve regurgitation is not visualized. No aortic stenosis is present. Pulmonic Valve: The pulmonic valve was normal in structure. Pulmonic valve regurgitation is mild. No evidence of pulmonic stenosis. Aorta: The aortic root is normal in size and structure. Venous: The inferior vena cava is normal in size with greater than 50% respiratory variability, suggesting right atrial pressure of 3 mmHg. IAS/Shunts: No atrial level shunt detected by color flow Doppler.  TRICUSPID VALVE TR Peak grad:   32.6 mmHg TR Vmax:        285.38 cm/s Dani Gobble Croitoru MD Electronically signed by Sanda Klein MD Signature Date/Time: 09/20/2020/1:58:58 PM    Final     Assessment & Plan:   Sheamus was seen today for annual exam.  Diagnoses and all orders for this visit:  Essential hypertension- His BP is well controlled.  Hyperlipidemia with target LDL less than 160- I have asked him to stake a statin for CV risk reduction. -     rosuvastatin (CRESTOR) 20 MG tablet; Take 1 tablet (20 mg total) by mouth daily.  Persistent atrial fibrillation ([place])- He has good rate/rhythm control. Will cont the DOAC  Routine general medical examination at a health care facility- Exam completed, labs reviewed, cancer screenings are  UTD, vaccines are UTD, pt ed material was given.  Hyperglycemia- His A1C is 5.8%. medical therapy is not indicated.   I am having Sriyan K. Mans "Yvone Neu" maintain his vitamin C, Turmeric, Misc Natural Products (OSTEO BI-FLEX TRIPLE STRENGTH PO), Zinc, Vitamin D-3, vitamin E, multivitamin with minerals, Menaquinone-7 (VITAMIN K2 PO), apixaban, and rosuvastatin.  Meds ordered this encounter  Medications  . rosuvastatin (CRESTOR) 20 MG tablet    Sig: Take 1 tablet (20 mg total) by mouth daily.    Dispense:  90 tablet    Refill:  1     Follow-up: Return in about 6 months (around 05/21/2021).  Scarlette Calico, MD

## 2020-11-19 NOTE — Patient Instructions (Signed)

## 2020-12-21 ENCOUNTER — Encounter: Payer: Self-pay | Admitting: Internal Medicine

## 2020-12-23 ENCOUNTER — Other Ambulatory Visit: Payer: Self-pay

## 2020-12-23 ENCOUNTER — Encounter: Payer: Self-pay | Admitting: Internal Medicine

## 2020-12-23 ENCOUNTER — Ambulatory Visit (INDEPENDENT_AMBULATORY_CARE_PROVIDER_SITE_OTHER): Payer: Medicare HMO | Admitting: Internal Medicine

## 2020-12-23 DIAGNOSIS — L0291 Cutaneous abscess, unspecified: Secondary | ICD-10-CM | POA: Insufficient documentation

## 2020-12-23 DIAGNOSIS — W57XXXA Bitten or stung by nonvenomous insect and other nonvenomous arthropods, initial encounter: Secondary | ICD-10-CM | POA: Insufficient documentation

## 2020-12-23 DIAGNOSIS — S40861A Insect bite (nonvenomous) of right upper arm, initial encounter: Secondary | ICD-10-CM

## 2020-12-23 MED ORDER — MUPIROCIN 2 % EX OINT: TOPICAL_OINTMENT | CUTANEOUS | 0 refills | Status: DC

## 2020-12-23 MED ORDER — DOXYCYCLINE HYCLATE 100 MG PO TABS: 100.0000 mg | ORAL_TABLET | Freq: Two times a day (BID) | ORAL | 0 refills | Status: DC

## 2020-12-23 NOTE — Progress Notes (Signed)
Subjective:  Patient ID: [name], male    DOB: [birth year]  Age: 70 y.o. MRN: [id number]  CC: Tick Removal (Pt states he found a tick on him 2 weeks ago. Was able to remove tick, but now the area is swollen and red)   HPI [name] presents for a tick bite a couple weeks ago that has gotten red, swollen and painful in the last day or two  Outpatient Medications Prior to Visit  Medication Sig Dispense Refill  . Ascorbic Acid (VITAMIN C) 1000 MG tablet Take 2,000 mg by mouth daily.    . Cholecalciferol (VITAMIN D-3) 125 MCG (5000 UT) TABS Take 5,000 Units by mouth daily.    . Menaquinone-7 (VITAMIN K2 PO) Take 1 tablet by mouth daily.    . Misc Natural Products (OSTEO BI-FLEX TRIPLE STRENGTH PO) Take 1 tablet by mouth daily.    . Multiple Vitamin (MULTIVITAMIN WITH MINERALS) TABS tablet Take 1 tablet by mouth daily. One-A-Day Men's Health    . Turmeric 500 MG CAPS Take 500 mg by mouth daily.     . vitamin E 400 UNIT capsule Take 800 Units by mouth daily.    . Zinc 50 MG TABS Take 50 mg by mouth daily.    Marland Kitchen apixaban (ELIQUIS) 5 MG TABS tablet Take 1 tablet (5 mg total) by mouth 2 (two) times daily. (Patient not taking: Reported on 11/19/2020) 14 tablet 0  . rosuvastatin (CRESTOR) 20 MG tablet Take 1 tablet (20 mg total) by mouth daily. 90 tablet 1   No facility-administered medications prior to visit.    ROS: Review of Systems  Constitutional: Positive for chills and fever. Negative for fatigue and unexpected weight change.  Skin: Positive for color change.  Neurological: Negative for light-headedness.    Objective:  BP 134/78 (BP Location: Left Arm)   Pulse 65   Temp 99.2 F (37.3 C) (Oral)   SpO2 97%   BP Readings from Last 3 Encounters:  12/23/20 134/78  11/19/20 122/68  11/19/20 122/68    Wt Readings from Last 3 Encounters:  11/19/20 208 lb (94.3 kg)  11/19/20 208 lb (94.3 kg)  10/08/20 202 lb 6.4 oz (91.8 kg)    Physical Exam Constitutional:       General: He is not in acute distress.    Appearance: Normal appearance. He is not ill-appearing, toxic-appearing or diaphoretic.  Skin:    Findings: Erythema and lesion present.  Neurological:     Mental Status: He is oriented to person, place, and time.     Coordination: Coordination normal.    There is a 2 x 1.5 cm right upper posterior arm abscess present  Procedure note:  Incision and Drainage of an Abscess   Indication : a localized collection of pus that is tender and not spontaneously resolving.    Risks including unsuccessful procedure , possible need for a repeat procedure due to pus accumulation, scar formation, and others as well as benefits were explained to the patient in detail. A consent was obtained verbally.   The patient was placed in a decubitus position. The area of an abscess  was prepped with povidone-iodine and draped in a sterile fashion.  1 cm incision with a straight blade was made. About 1 cc of purulent material was expressed.The cavity was irrigated.  1.5 inch of packing material was inserted with instructions to remove it in 48 hours.  The wound was dressed with Bactroban antibiotic ointment and a large bandaid.  Tolerated well. Complications: None.   Wound instructions provided.       .    Lab Results  Component Value Date   WBC 6.7 11/06/2020   HGB 13.4 11/06/2020   HCT 39.8 11/06/2020   PLT 182.0 11/06/2020   GLUCOSE 92 11/06/2020   CHOL 258 (H) 11/06/2020   TRIG 140.0 11/06/2020   HDL 44.80 11/06/2020   LDLDIRECT 206.6 08/04/2013   LDLCALC 185 (H) 11/06/2020   ALT 57 (H) 09/18/2020   AST 27 09/18/2020   NA 141 11/06/2020   K 3.9 11/06/2020   CL 104 11/06/2020   CREATININE 0.87 11/06/2020   BUN 15 11/06/2020   CO2 29 11/06/2020   TSH 0.680 09/17/2020   PSA 1.64 11/06/2020   INR 1.0 07/02/2008   HGBA1C 5.8 11/06/2020    ECHO TEE  Result Date: 09/20/2020    TRANSESOPHOGEAL ECHO REPORT   Patient Name:   VINCEN BEJAR Date of  Exam: 09/20/2020 Medical Rec #:  [id number]       Height:       72.0 in Accession #:    [id number]      Weight:       205.0 lb Date of Birth:  1951-07-17       BSA:          2.153 m Patient Age:    18 years        BP:           124/75 mmHg Patient Gender: M               HR:           119 bpm. Exam Location:  Outpatient Procedure: Transesophageal Echo, Color Doppler and Cardiac Doppler Indications:     atrial fibrillation  History:         Patient has no prior history of Echocardiogram examinations.                  Arrythmias:Atrial Fibrillation; Risk Factors:Sleep Apnea and                  Dyslipidemia.  Sonographer:     Johny Chess Referring Phys:  [id number] [place] Diagnosing Phys: Sanda Klein MD PROCEDURE: After discussion of the risks and benefits of a TEE, an informed consent was obtained from the patient. The transesophogeal probe was passed without difficulty through the esophogus of the patient. Sedation performed by different physician. The patient was monitored while under deep sedation. Anesthestetic sedation was provided intravenously by Anesthesiology: 192mg  of Propofol, 20mg  of Lidocaine. The patient's vital signs; including heart rate, blood pressure, and oxygen saturation; remained stable throughout the procedure. The patient developed no complications during the procedure. A direct current cardioversion was performed. IMPRESSIONS  1. Left ventricular ejection fraction, by estimation, is 55 to 60%. The left ventricle has normal function. The left ventricle has no regional wall motion abnormalities. Left ventricular diastolic function could not be evaluated.  2. Right ventricular systolic function is normal. The right ventricular size is mildly enlarged. There is mildly elevated pulmonary artery systolic pressure.  3. No left atrial/left atrial appendage thrombus was detected. The LAA emptying velocity was 60 cm/s.  4. The mitral valve is normal in structure. Trivial mitral valve  regurgitation. No evidence of mitral stenosis.  5. Tricuspid valve regurgitation is mild to moderate.  6. The aortic valve is normal in structure. Aortic valve regurgitation is not visualized. No aortic stenosis is present.  7.  The inferior vena cava is normal in size with greater than 50% respiratory variability, suggesting right atrial pressure of 3 mmHg. Conclusion(s)/Recommendation(s): Normal biventricular function without evidence of hemodynamically significant valvular heart disease. FINDINGS  Left Ventricle: Left ventricular ejection fraction, by estimation, is 55 to 60%. The left ventricle has normal function. The left ventricle has no regional wall motion abnormalities. The left ventricular internal cavity size was normal in size. There is  no left ventricular hypertrophy. Left ventricular diastolic function could not be evaluated due to atrial fibrillation. Left ventricular diastolic function could not be evaluated. Right Ventricle: The right ventricular size is mildly enlarged. No increase in right ventricular wall thickness. Right ventricular systolic function is normal. There is mildly elevated pulmonary artery systolic pressure. The tricuspid regurgitant velocity is 2.85 m/s, and with an assumed right atrial pressure of 3 mmHg, the estimated right ventricular systolic pressure is 03.7 mmHg. Left Atrium: Left atrial size was normal in size. Spontaneous echo contrast was present in the left atrium. No left atrial/left atrial appendage thrombus was detected. The LAA emptying velocity was 60 cm/s. Right Atrium: Right atrial size was normal in size. Pericardium: There is no evidence of pericardial effusion. Mitral Valve: The mitral valve is normal in structure. Trivial mitral valve regurgitation. No evidence of mitral valve stenosis. Tricuspid Valve: The tricuspid valve is normal in structure. Tricuspid valve regurgitation is mild to moderate. No evidence of tricuspid stenosis. Aortic Valve: The aortic  valve is normal in structure. Aortic valve regurgitation is not visualized. No aortic stenosis is present. Pulmonic Valve: The pulmonic valve was normal in structure. Pulmonic valve regurgitation is mild. No evidence of pulmonic stenosis. Aorta: The aortic root is normal in size and structure. Venous: The inferior vena cava is normal in size with greater than 50% respiratory variability, suggesting right atrial pressure of 3 mmHg. IAS/Shunts: No atrial level shunt detected by color flow Doppler.  TRICUSPID VALVE TR Peak grad:   32.6 mmHg TR Vmax:        285.38 cm/s Sanda Klein MD Electronically signed by Sanda Klein MD Signature Date/Time: 09/20/2020/1:58:58 PM    Final     Assessment & Plan:   There are no diagnoses linked to this encounter.   No orders of the defined types were placed in this encounter.    Follow-up: No follow-ups on file.  Walker Kehr, MD

## 2020-12-23 NOTE — Patient Instructions (Signed)
Remove packing in 48 hrs

## 2020-12-23 NOTE — Assessment & Plan Note (Addendum)
R upper arm In the view of the tick bite complication I prescribed Doxy 100 mg twice daily for 2 weeks Bactroban ointment for dressing change

## 2020-12-23 NOTE — Assessment & Plan Note (Addendum)
In the view of the tick bite complication I prescribed Doxy 100 mg twice daily for 2 weeks Bactroban ointment for dressing change

## 2021-02-19 ENCOUNTER — Other Ambulatory Visit: Payer: Self-pay | Admitting: Internal Medicine

## 2021-02-19 DIAGNOSIS — I728 Aneurysm of other specified arteries: Secondary | ICD-10-CM

## 2021-03-12 ENCOUNTER — Other Ambulatory Visit: Payer: Self-pay

## 2021-03-12 ENCOUNTER — Ambulatory Visit
Admission: RE | Admit: 2021-03-12 | Discharge: 2021-03-12 | Disposition: A | Discharge status: Home / Self Care | Payer: Medicare HMO | Source: Ambulatory Visit | Attending: Internal Medicine | Admitting: Internal Medicine

## 2021-03-12 DIAGNOSIS — I728 Aneurysm of other specified arteries: Secondary | ICD-10-CM

## 2021-03-12 MED ORDER — IOPAMIDOL (ISOVUE-370) INJECTION 76%
75.0000 mL | Freq: Once | INTRAVENOUS | Status: AC | PRN
  Administered 2021-03-12: 75 mL via INTRAVENOUS

## 2021-12-01 ENCOUNTER — Ambulatory Visit (INDEPENDENT_AMBULATORY_CARE_PROVIDER_SITE_OTHER): Payer: Medicare HMO

## 2021-12-01 DIAGNOSIS — Z0000 Encounter for general adult medical examination without abnormal findings: Secondary | ICD-10-CM | POA: Diagnosis not present

## 2021-12-01 NOTE — Progress Notes (Signed)
? ?Subjective:  ? Ronald Franklin is a 71 y.o. male who presents for an Initial Medicare Annual Wellness Visit. ? ? ?I connected with Evern Bio today by telephone and verified that I am speaking with the correct person using two identifiers. ?Location patient: home ?Location provider: work ?Persons participating in the virtual visit: patient, provider. ?  ?I discussed the limitations, risks, security and privacy concerns of performing an evaluation and management service by telephone and the availability of in person appointments. I also discussed with the patient that there may be a patient responsible charge related to this service. The patient expressed understanding and verbally consented to this telephonic visit.  ?  ?Interactive audio and video telecommunications were attempted between this provider and patient, however failed, due to patient having technical difficulties OR patient did not have access to video capability.  We continued and completed visit with audio only. ? ?  ?Review of Systems    ? ?Cardiac Risk Factors include: advanced age (>67mn, >>74women);male gender ? ?   ?Objective:  ?  ?Today's Vitals  ? ?There is no height or weight on file to calculate BMI. ? ? ?  12/01/2021  ?  1:19 PM 11/19/2020  ?  3:59 PM 09/20/2020  ?  9:28 AM 09/16/2020  ?  8:41 AM 08/31/2020  ? 11:58 AM 01/19/2019  ? 10:31 AM 01/16/2019  ?  4:33 PM  ?Advanced Directives  ?Does Patient Have a Medical Advance Directive? Yes Yes Yes Yes No No No  ?Type of Advance Directive Healthcare Power of Attorney Living will;Healthcare Power of [place]     ?Does patient want to make changes to medical advance directive?  No - Patient declined       ?Copy of [place]? No - copy requested No - copy requested  No - copy requested     ?Would patient like information on creating a medical advance directive?      No - Patient declined   ? ? ?Current Medications  (verified) ?Outpatient Encounter Medications as of 12/01/2021  ?Medication Sig  ? Ascorbic Acid (VITAMIN C) 1000 MG tablet Take 2,000 mg by mouth daily.  ? Cholecalciferol (VITAMIN D-3) 125 MCG (5000 UT) TABS Take 5,000 Units by mouth daily.  ? Menaquinone-7 (VITAMIN K2 PO) Take 1 tablet by mouth daily.  ? Multiple Vitamin (MULTIVITAMIN WITH MINERALS) TABS tablet Take 1 tablet by mouth daily. One-A-Day Men's Health  ? Turmeric 500 MG CAPS Take 500 mg by mouth daily.   ? vitamin E 400 UNIT capsule Take 800 Units by mouth daily.  ? Zinc 50 MG TABS Take 50 mg by mouth daily.  ? doxycycline (VIBRA-TABS) 100 MG tablet Take 1 tablet (100 mg total) by mouth 2 (two) times daily. (Patient not taking: Reported on 12/01/2021)  ? Misc Natural Products (OSTEO BI-FLEX TRIPLE STRENGTH PO) Take 1 tablet by mouth daily. (Patient not taking: Reported on 12/01/2021)  ? mupirocin ointment (BACTROBAN) 2 % On leg wound w/dressing change qd or bid (Patient not taking: Reported on 12/01/2021)  ? ?No facility-administered encounter medications on file as of 12/01/2021.  ? ? ?Allergies (verified) ?Patient has no known allergies.  ? ?History: ?Past Medical History:  ?Diagnosis Date  ? Allergic rhinitis   ? Arthritis   ? CTS (carpal tunnel syndrome)   ? Hypertension   ? OSA (obstructive sleep apnea)   ? Type II diabetes mellitus with manifestations ([facility] 09/20/2020  ? ?  Past Surgical History:  ?Procedure Laterality Date  ? BACK SURGERY    ? C4-5-6   2 cervical    from AA  ? CARDIOVERSION N/A 09/20/2020  ? Procedure: CARDIOVERSION;  Surgeon: Sanda Klein, MD;  Location: [facility];  Service: Cardiovascular;  Laterality: N/A;  ? CARPAL TUNNEL RELEASE  09/16/2011  ? Procedure: CARPAL TUNNEL RELEASE;  Surgeon: Floyce Stakes, MD;  Location: [facility];  Service: Neurosurgery;  Laterality: Left;  Left Carpal Tunnel Release  ? CERVICAL LAMINECTOMY    ? EXTRACORPOREAL SHOCK WAVE LITHOTRIPSY Left 09/16/2020  ? Procedure: EXTRACORPOREAL SHOCK WAVE  LITHOTRIPSY (ESWL);  Surgeon: Raynelle Bring, MD;  Location: [facility];  Service: Urology;  Laterality: Left;  ? KNEE ARTHROSCOPY WITH MEDIAL MENISECTOMY  07/06/2012  ? Procedure: KNEE ARTHROSCOPY WITH MEDIAL MENISECTOMY;  Surgeon: Magnus Sinning, MD;  Location: [facility];  Service: Orthopedics;  Laterality: Left;  LEFT KNEE ARTHROSCOPY WITH PARTIAL MEDIAL MENISECTOMY  ? KNEE SURGERY    ? NECK SURGERY    ? ROTATOR CUFF REPAIR    ? bilateral   ? TEE WITHOUT CARDIOVERSION N/A 09/20/2020  ? Procedure: TRANSESOPHAGEAL ECHOCARDIOGRAM (TEE);  Surgeon: Sanda Klein, MD;  Location: [place];  Service: Cardiovascular;  Laterality: N/A;  ? TOTAL HIP ARTHROPLASTY Left 01/19/2019  ? Procedure: TOTAL HIP ARTHROPLASTY ANTERIOR APPROACH;  Surgeon: Rod Can, MD;  Location: [facility];  Service: Orthopedics;  Laterality: Left;  ? ?Family History  ?Problem Relation Age of Onset  ? Diabetes Brother   ? Diabetes Brother   ? Hyperlipidemia Other   ? Prostate cancer Other   ? Heart disease Neg Hx   ? Early death Neg Hx   ? Alcohol abuse Neg Hx   ? Arthritis Neg Hx   ? Cancer Neg Hx   ? Stroke Neg Hx   ? Kidney disease Neg Hx   ? Hypertension Neg Hx   ? ?Social History  ? ?Socioeconomic History  ? Marital status: Married  ?  Spouse name: Not on file  ? Number of children: 6  ? Years of education: BS  ? Highest education level: Not on file  ?Occupational History  ? Occupation: Retired [employer]  ?Tobacco Use  ? Smoking status: Never  ? Smokeless tobacco: Never  ?Substance and Sexual Activity  ? Alcohol use: Yes  ?  Alcohol/week: 5.0 standard drinks  ?  Types: 5 Glasses of wine per week  ?  Comment: socially  ? Drug use: No  ? Sexual activity: Yes  ?  Birth control/protection: None  ?Other Topics Concern  ? Not on file  ?Social History Narrative  ? 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 Married. Has children.  ? Drinks 1 cup of coffee a day    ? ?Social Determinants of Health  ? ?Financial Resource Strain: Low Risk   ? Difficulty of Paying Living Expenses: Not hard at all  ?Food Insecurity: No Food Insecurity  ? Worried About Charity fundraiser in the Last Year: Never true  ? Ran Out of Food in the Last Year: Never true  ?Transportation Needs: No Transportation Needs  ? Lack of Transportation (Medical): No  ? Lack of Transportation (Non-Medical): No  ?Physical Activity: Insufficiently Active  ? Days of Exercise per Week: 2 days  ? Minutes of Exercise per Session: 40 min  ?Stress: No Stress Concern Present  ? Feeling of Stress :  Not at all  ?Social Connections: Moderately Isolated  ? Frequency of Communication with Friends and Family: Twice a week  ? Frequency of Social Gatherings with Friends and Family: Twice a week  ? Attends Religious Services: Never  ? Active Member of Clubs or Organizations: No  ? Attends Archivist Meetings: Never  ? Marital Status: Married  ? ? ?Tobacco Counseling ?Counseling given: Not Answered ? ? ?Clinical Intake: ? ?Pre-visit preparation completed: Yes ? ?Pain : No/denies pain ? ?  ? ?Nutritional Risks: None ?Diabetes: No ? ?How often do you need to have someone help you when you read instructions, pamphlets, or other written materials from your doctor or pharmacy?: 1 - Never ?What is the last grade level you completed in school?: College ? ?Diabetic?noi  ? ?Interpreter Needed?: No ? ?Information entered by :: [email or web address] ? ? ?Activities of Daily Living ? ?  12/01/2021  ?  1:22 PM  ?In your present state of health, do you have any difficulty performing the following activities:  ?Hearing? 0  ?Vision? 0  ?Difficulty concentrating or making decisions? 0  ?Walking or climbing stairs? 0  ?Dressing or bathing? 0  ?Doing errands, shopping? 0  ?Preparing Food and eating ? N  ?Using the Toilet? N  ?In the past six months, have you accidently leaked urine? N  ?Do you have problems with loss of bowel control? N  ?Managing your  Medications? N  ?Managing your Finances? N  ?Housekeeping or managing your Housekeeping? N  ? ? ?Patient Care Team: ?Janith Lima, MD as PCP - General (Internal Medicine) ?Christain Sacramento, OD as Referring Physician (Optometry) ? ?I

## 2021-12-01 NOTE — Patient Instructions (Signed)
[name] , ?Thank you for taking time to come for your Medicare Wellness Visit. I appreciate your ongoing commitment to your health goals. Please review the following plan we discussed and let me know if I can assist you in the future.  ? ?Screening recommendations/referrals: ?Colonoscopy: 07/05/2013 ?Recommended yearly ophthalmology/optometry visit for glaucoma screening and checkup ?Recommended yearly dental visit for hygiene and checkup ? ?Vaccinations: ?Influenza vaccine: declined  ?Pneumococcal vaccine: completed  ?Tdap vaccine: 10/07/2015 ?Shingles vaccine: completed    ? ?Advanced directives: yes  ? ?Conditions/risks identified: none  ? ?Next appointment: none  ? ?Preventive Care 36 Years and Older, Male ?Preventive care refers to lifestyle choices and visits with your health care provider that can promote health and wellness. ?What does preventive care include? ?A yearly physical exam. This is also called an annual well check. ?Dental exams once or twice a year. ?Routine eye exams. Ask your health care provider how often you should have your eyes checked. ?Personal lifestyle choices, including: ?Daily care of your teeth and gums. ?Regular physical activity. ?Eating a healthy diet. ?Avoiding tobacco and drug use. ?Limiting alcohol use. ?Practicing safe sex. ?Taking low doses of aspirin every day. ?Taking vitamin and mineral supplements as recommended by your health care provider. ?What happens during an annual well check? ?The services and screenings done by your health care provider during your annual well check will depend on your age, overall health, lifestyle risk factors, and family history of disease. ?Counseling  ?Your health care provider may ask you questions about your: ?Alcohol use. ?Tobacco use. ?Drug use. ?Emotional well-being. ?Home and relationship well-being. ?Sexual activity. ?Eating habits. ?History of falls. ?Memory and ability to understand (cognition). ?Work and work  Statistician. ?Screening  ?You may have the following tests or measurements: ?Height, weight, and BMI. ?Blood pressure. ?Lipid and cholesterol levels. These may be checked every 5 years, or more frequently if you are over 75 years old. ?Skin check. ?Lung cancer screening. You may have this screening every year starting at age 17 if you have a 30-pack-year history of smoking and currently smoke or have quit within the past 15 years. ?Fecal occult blood test (FOBT) of the stool. You may have this test every year starting at age 41. ?Flexible sigmoidoscopy or colonoscopy. You may have a sigmoidoscopy every 5 years or a colonoscopy every 10 years starting at age 45. ?Prostate cancer screening. Recommendations will vary depending on your family history and other risks. ?Hepatitis C blood test. ?Hepatitis B blood test. ?Sexually transmitted disease (STD) testing. ?Diabetes screening. This is done by checking your blood sugar (glucose) after you have not eaten for a while (fasting). You may have this done every 1-3 years. ?Abdominal aortic aneurysm (AAA) screening. You may need this if you are a current or former smoker. ?Osteoporosis. You may be screened starting at age 4 if you are at high risk. ?Talk with your health care provider about your test results, treatment options, and if necessary, the need for more tests. ?Vaccines  ?Your health care provider may recommend certain vaccines, such as: ?Influenza vaccine. This is recommended every year. ?Tetanus, diphtheria, and acellular pertussis (Tdap, Td) vaccine. You may need a Td booster every 10 years. ?Zoster vaccine. You may need this after age 27. ?Pneumococcal 13-valent conjugate (PCV13) vaccine. One dose is recommended after age 61. ?Pneumococcal polysaccharide (PPSV23) vaccine. One dose is recommended after age 74. ?Talk to your health care provider about which screenings and vaccines you need and how often you need  them. ?This information is not intended to replace  advice given to you by your health care provider. Make sure you discuss any questions you have with your health care provider. ?Document Released: 08/30/2015 Document Revised: 04/22/2016 Document Reviewed: 06/04/2015 ?Elsevier Interactive Patient Education ? 2017 [employer]. ? ?Fall Prevention in the Home ?Falls can cause injuries. They can happen to people of all ages. There are many things you can do to make your home safe and to help prevent falls. ?What can I do on the outside of my home? ?Regularly fix the edges of walkways and driveways and fix any cracks. ?Remove anything that might make you trip as you walk through a door, such as a raised step or threshold. ?Trim any bushes or trees on the path to your home. ?Use bright outdoor lighting. ?Clear any walking paths of anything that might make someone trip, such as rocks or tools. ?Regularly check to see if handrails are loose or broken. Make sure that both sides of any steps have handrails. ?Any raised decks and porches should have guardrails on the edges. ?Have any leaves, snow, or ice cleared regularly. ?Use sand or salt on walking paths during winter. ?Clean up any spills in your garage right away. This includes oil or grease spills. ?What can I do in the bathroom? ?Use night lights. ?Install grab bars by the toilet and in the tub and shower. Do not use towel bars as grab bars. ?Use non-skid mats or decals in the tub or shower. ?If you need to sit down in the shower, use a plastic, non-slip stool. ?Keep the floor dry. Clean up any water that spills on the floor as soon as it happens. ?Remove soap buildup in the tub or shower regularly. ?Attach bath mats securely with double-sided non-slip rug tape. ?Do not have throw rugs and other things on the floor that can make you trip. ?What can I do in the bedroom? ?Use night lights. ?Make sure that you have a light by your bed that is easy to reach. ?Do not use any sheets or blankets that are too big for your bed.  They should not hang down onto the floor. ?Have a firm chair that has side arms. You can use this for support while you get dressed. ?Do not have throw rugs and other things on the floor that can make you trip. ?What can I do in the kitchen? ?Clean up any spills right away. ?Avoid walking on wet floors. ?Keep items that you use a lot in easy-to-reach places. ?If you need to reach something above you, use a strong step stool that has a grab bar. ?Keep electrical cords out of the way. ?Do not use floor polish or wax that makes floors slippery. If you must use wax, use non-skid floor wax. ?Do not have throw rugs and other things on the floor that can make you trip. ?What can I do with my stairs? ?Do not leave any items on the stairs. ?Make sure that there are handrails on both sides of the stairs and use them. Fix handrails that are broken or loose. Make sure that handrails are as long as the stairways. ?Check any carpeting to make sure that it is firmly attached to the stairs. Fix any carpet that is loose or worn. ?Avoid having throw rugs at the top or bottom of the stairs. If you do have throw rugs, attach them to the floor with carpet tape. ?Make sure that you have a light switch at  the top of the stairs and the bottom of the stairs. If you do not have them, ask someone to add them for you. ?What else can I do to help prevent falls? ?Wear shoes that: ?Do not have high heels. ?Have rubber bottoms. ?Are comfortable and fit you well. ?Are closed at the toe. Do not wear sandals. ?If you use a stepladder: ?Make sure that it is fully opened. Do not climb a closed stepladder. ?Make sure that both sides of the stepladder are locked into place. ?Ask someone to hold it for you, if possible. ?Clearly mark and make sure that you can see: ?Any grab bars or handrails. ?First and last steps. ?Where the edge of each step is. ?Use tools that help you move around (mobility aids) if they are needed. These  include: ?Canes. ?Walkers. ?Scooters. ?Crutches. ?Turn on the lights when you go into a dark area. Replace any light bulbs as soon as they burn out. ?Set up your furniture so you have a clear path. Avoid moving your furniture around. ?If any of you

## 2022-02-16 ENCOUNTER — Other Ambulatory Visit: Payer: Self-pay | Admitting: Internal Medicine

## 2022-02-16 DIAGNOSIS — I728 Aneurysm of other specified arteries: Secondary | ICD-10-CM

## 2022-03-19 IMAGING — CT CT ABD-PELV W/ CM
2 of 5 series · 16 of 46 positions shown, 18 images · IV contrast (OMNIPAQUE 300)
Comparison: None.

CLINICAL DATA: Nausea. Left abdominal pain. History of hernia
repair.

EXAM:
CT ABDOMEN AND PELVIS WITH CONTRAST
TECHNIQUE: Multidetector CT imaging of the abdomen and pelvis was performed
using the standard protocol following bolus administration of
intravenous contrast.
CONTRAST:  100mL OMNIPAQUE IOHEXOL 300 MG/ML  SOLN

[Series 3: axial st · axial · 0.84mm/px · z∈[+810,+1200]mm · 13 of 92 slices shown, 15 images]
[im 7/92  soft-tissue]
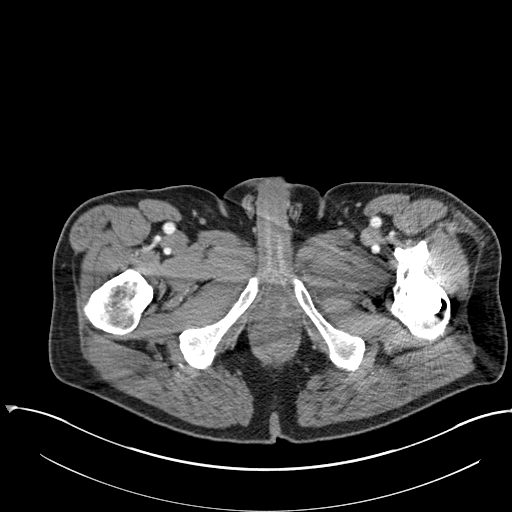
[im 7/92  bone]
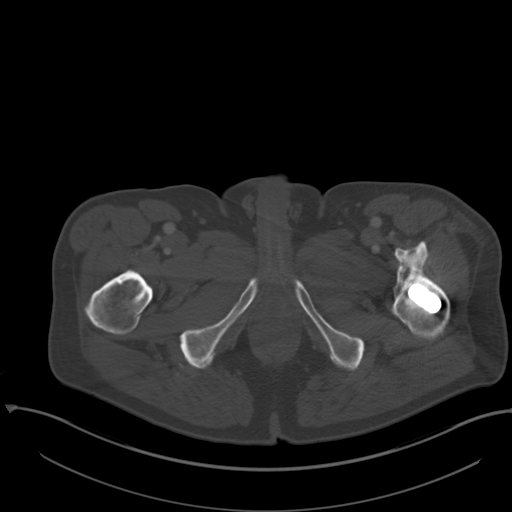
[im 14/92  soft-tissue]
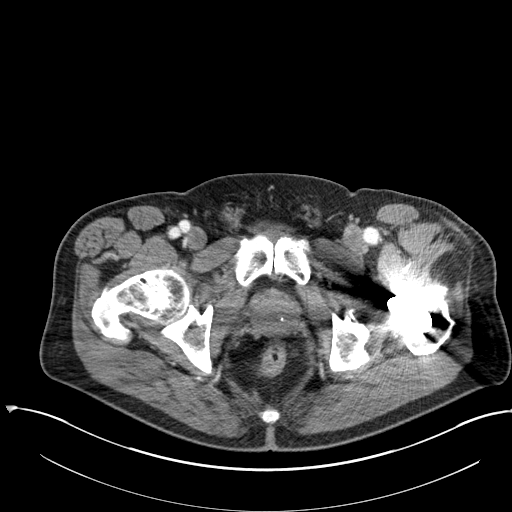
[im 20/92  soft-tissue]
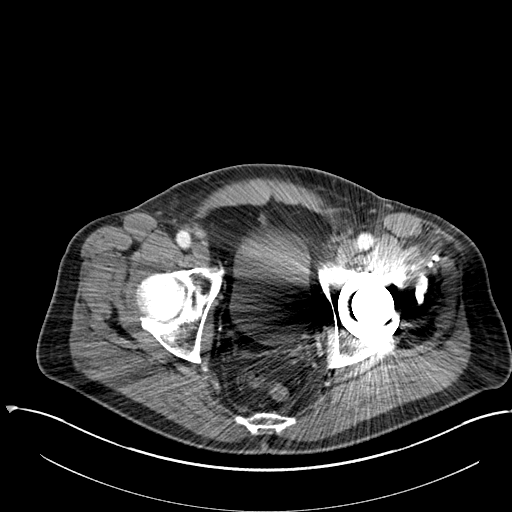
[im 27/92  soft-tissue]
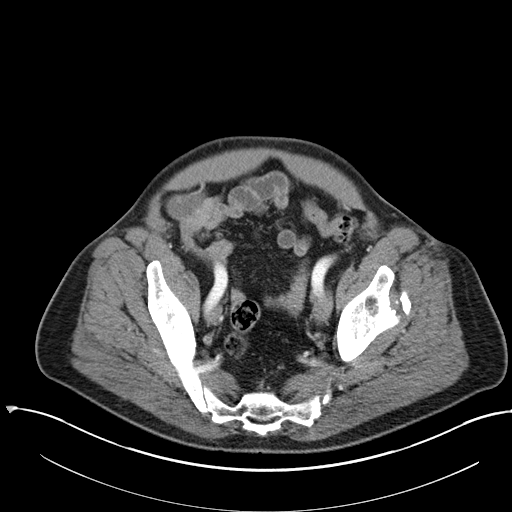
[im 33/92  soft-tissue]
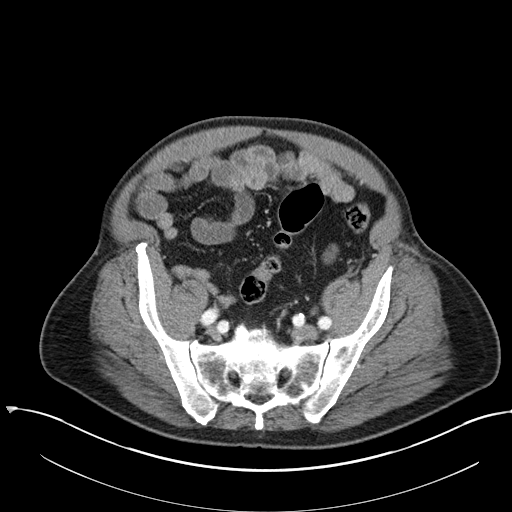
[im 40/92  soft-tissue]
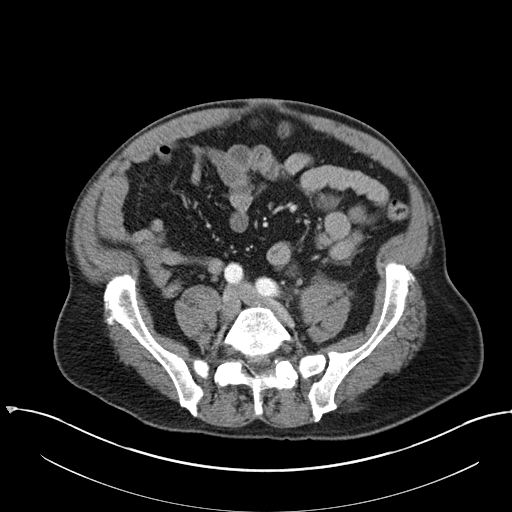
[im 46/92  soft-tissue]
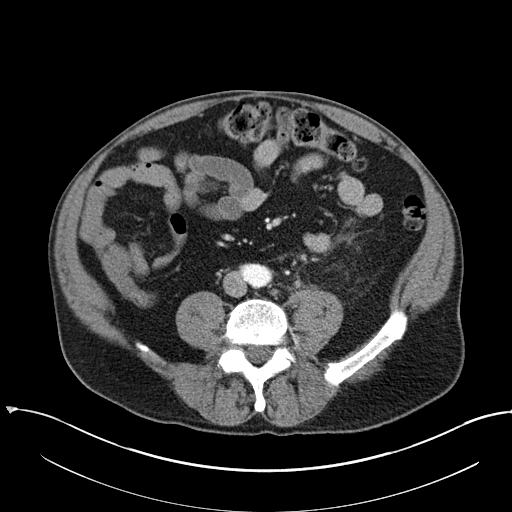
[im 53/92  soft-tissue]
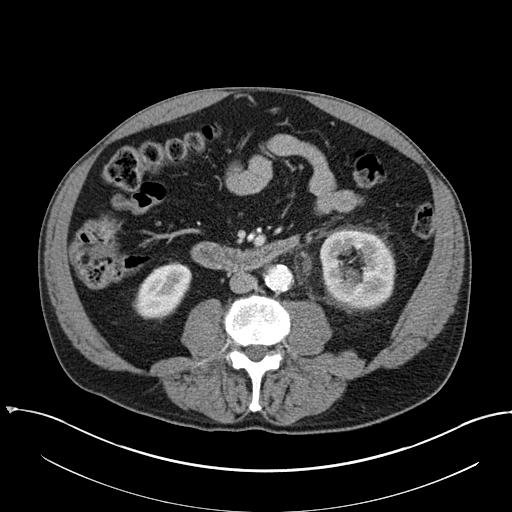
[im 59/92  soft-tissue]
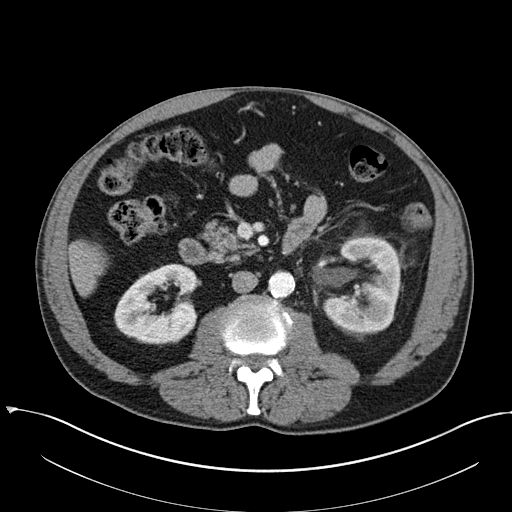
[im 59/92  bone]
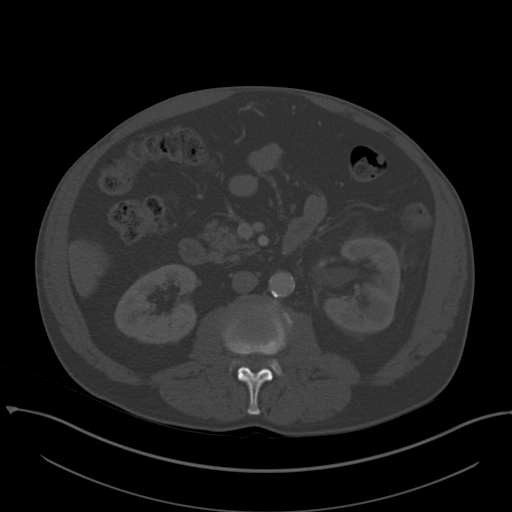
[im 66/92  soft-tissue]
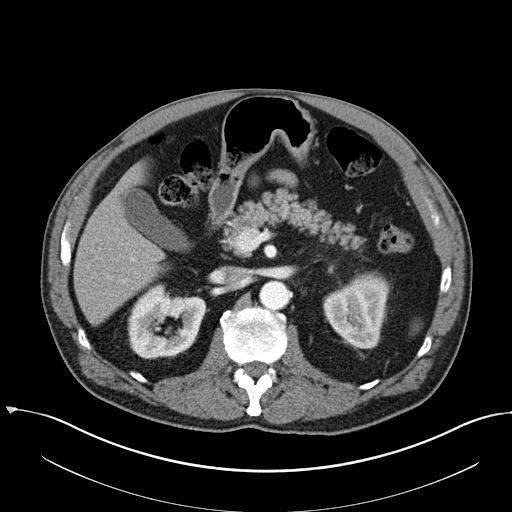
[im 72/92  soft-tissue]
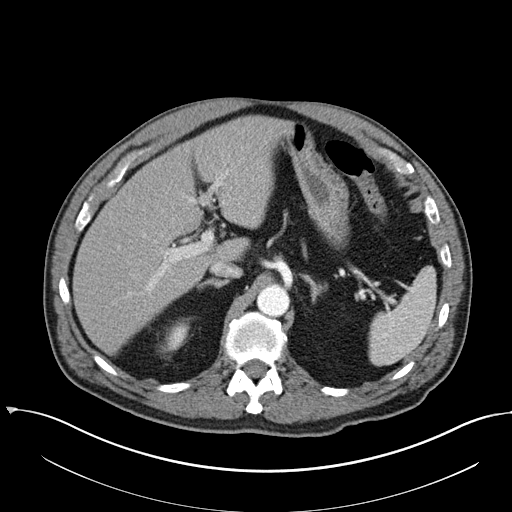
[im 79/92  soft-tissue]
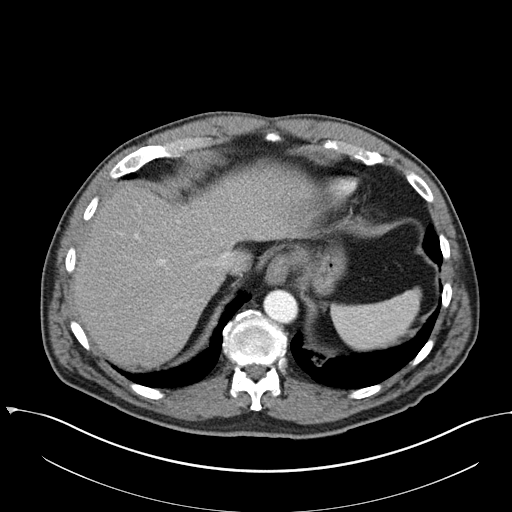
[im 85/92  soft-tissue]
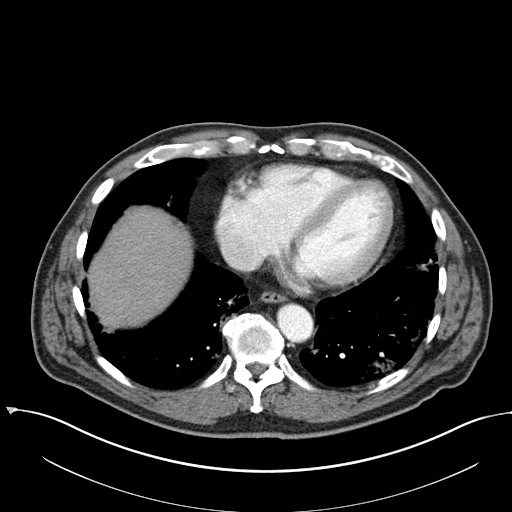

[Series 5: coronal st · coronal · 0.87mm/px · 3 of 159 slices shown]
[im 53/159  soft-tissue]
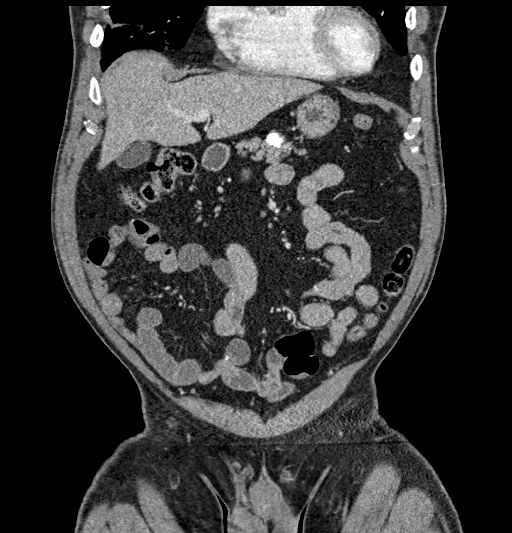
[im 71/159  soft-tissue]
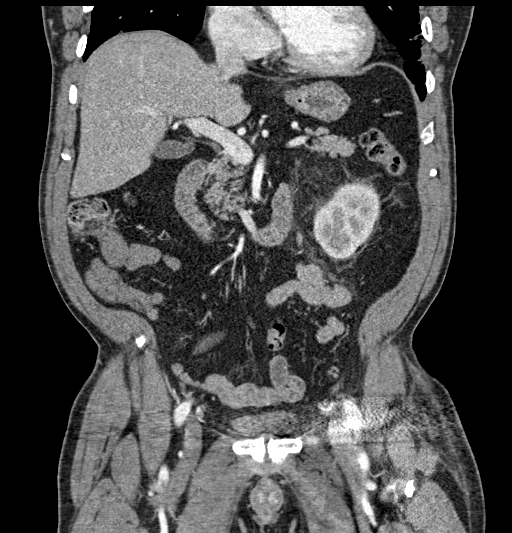
[im 88/159  soft-tissue]
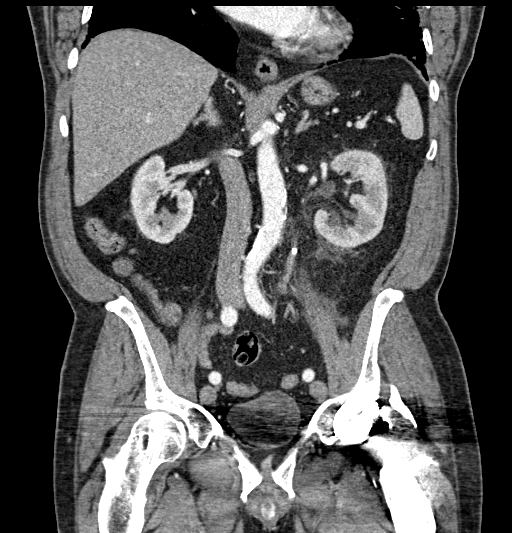

[16 of 46 positions shown; findings below may reference images not displayed]

FINDINGS: Lower chest: Prominent patchy peripheral consolidation and
ground-glass opacity throughout both lung bases.

Hepatobiliary: Normal liver size. No liver mass. Normal gallbladder
with no radiopaque cholelithiasis. No biliary ductal dilatation.

Pancreas: Normal, with no mass or duct dilation.

Spleen: Normal size. No mass.

Adrenals/Urinary Tract: Normal adrenals. Obstructing 4 mm proximal
left lumbar ureteral stone with mild left hydroureteronephrosis.
Asymmetric left perinephric fat stranding and delayed left contrast
nephrogram. Nonobstructing 2 mm interpolar right renal stone. No
right hydronephrosis. Nonobstructing 2 mm upper left renal stone.
Subcentimeter hypodense renal cortical lesion in the medial lower
left kidney, too small to characterize, requiring no follow-up.
Otherwise no renal masses. Nondistended bladder and pelvic ureters
obscured by streak artifact from left hip hardware with no gross
bladder abnormality.

Stomach/Bowel: Normal non-distended stomach. Normal caliber small
bowel with no small bowel wall thickening. Normal appendix. Normal
large bowel with no diverticulosis, large bowel wall thickening or
pericolonic fat stranding.

Vascular/Lymphatic: Atherosclerotic nonaneurysmal abdominal aorta.
Patent portal, splenic, hepatic and renal veins. Peripherally
coarsely calcified 2.8 x 1.9 cm proximal splenic artery aneurysm
(series 3/image 24). No pathologically enlarged lymph nodes in the
abdomen or pelvis.

Reproductive: Top-normal size prostate with nonspecific internal
prostatic calcifications.

Other: No pneumoperitoneum, ascites or focal fluid collection.

Musculoskeletal: No aggressive appearing focal osseous lesions. Left
total hip arthroplasty. Moderate thoracolumbar spondylosis.
IMPRESSION: 1. Obstructing 4 mm proximal left lumbar ureteral stone with mild
left hydroureteronephrosis.
2. Additional bilateral nonobstructing tiny renal stones.
3. Prominent patchy peripheral consolidation and ground-glass
opacity throughout both lung bases, compatible with multilobar
pneumonia, suspicious for [id number] pneumonia.
4. Peripherally coarsely calcified 2.8 x 1.9 cm proximal splenic
artery aneurysm. Consider interventional radiology consultation.
Follow-up CT angiogram of the abdomen with IV contrast recommended
initially in 6-12 months.
5. Aortic Atherosclerosis ([id number]).

## 2022-03-27 ENCOUNTER — Ambulatory Visit
Admission: RE | Admit: 2022-03-27 | Discharge: 2022-03-27 | Disposition: A | Discharge status: Home / Self Care | Payer: Medicare HMO | Source: Ambulatory Visit | Attending: Internal Medicine | Admitting: Internal Medicine

## 2022-03-27 DIAGNOSIS — I728 Aneurysm of other specified arteries: Secondary | ICD-10-CM

## 2022-03-27 MED ORDER — IOPAMIDOL (ISOVUE-370) INJECTION 76%
75.0000 mL | Freq: Once | INTRAVENOUS | Status: AC | PRN
Start: 2022-03-27 — End: 2022-03-27
  Administered 2022-03-27: 75 mL via INTRAVENOUS

## 2022-04-02 ENCOUNTER — Other Ambulatory Visit: Payer: Self-pay | Admitting: Orthopedic Surgery

## 2022-04-02 DIAGNOSIS — M13822 Other specified arthritis, left elbow: Secondary | ICD-10-CM

## 2022-04-27 ENCOUNTER — Ambulatory Visit
Admission: RE | Admit: 2022-04-27 | Discharge: 2022-04-27 | Disposition: A | Discharge status: Home / Self Care | Payer: Medicare HMO | Source: Ambulatory Visit | Attending: Orthopedic Surgery | Admitting: Orthopedic Surgery

## 2022-04-27 DIAGNOSIS — M13822 Other specified arthritis, left elbow: Secondary | ICD-10-CM

## 2022-08-26 ENCOUNTER — Telehealth: Payer: Self-pay | Admitting: Internal Medicine

## 2022-08-26 NOTE — Telephone Encounter (Signed)
Patient called and said that he used to be a patient of Dr [name] and would like to re-establish with him. Is this okay?

## 2022-08-27 NOTE — Telephone Encounter (Signed)
Scheduled

## 2022-12-02 ENCOUNTER — Encounter (INDEPENDENT_AMBULATORY_CARE_PROVIDER_SITE_OTHER): Payer: Self-pay

## 2022-12-07 ENCOUNTER — Encounter: Payer: Self-pay | Admitting: Internal Medicine

## 2022-12-07 ENCOUNTER — Ambulatory Visit (INDEPENDENT_AMBULATORY_CARE_PROVIDER_SITE_OTHER): Payer: Medicare HMO | Admitting: Internal Medicine

## 2022-12-07 VITALS — BP 142/82 | HR 58 | Temp 97.9°F | Ht 72.0 in | Wt 209.0 lb

## 2022-12-07 DIAGNOSIS — E785 Hyperlipidemia, unspecified: Secondary | ICD-10-CM

## 2022-12-07 DIAGNOSIS — I10 Essential (primary) hypertension: Secondary | ICD-10-CM

## 2022-12-07 DIAGNOSIS — R001 Bradycardia, unspecified: Secondary | ICD-10-CM

## 2022-12-07 DIAGNOSIS — N400 Enlarged prostate without lower urinary tract symptoms: Secondary | ICD-10-CM | POA: Diagnosis not present

## 2022-12-07 DIAGNOSIS — Z0001 Encounter for general adult medical examination with abnormal findings: Secondary | ICD-10-CM | POA: Diagnosis not present

## 2022-12-07 DIAGNOSIS — R9431 Abnormal electrocardiogram [ECG] [EKG]: Secondary | ICD-10-CM

## 2022-12-07 LAB — CBC WITH DIFFERENTIAL/PLATELET
Basophils Absolute: 0 10*3/uL (ref 0.0–0.1)
Basophils Relative: 0.5 % (ref 0.0–3.0)
Eosinophils Absolute: 0.2 10*3/uL (ref 0.0–0.7)
Eosinophils Relative: 2.4 % (ref 0.0–5.0)
HCT: 43.7 % (ref 39.0–52.0)
Hemoglobin: 14.9 g/dL (ref 13.0–17.0)
Lymphocytes Relative: 45.2 % (ref 12.0–46.0)
Lymphs Abs: 4 10*3/uL (ref 0.7–4.0)
MCHC: 34 g/dL (ref 30.0–36.0)
MCV: 92.5 fl (ref 78.0–100.0)
Monocytes Absolute: 0.5 10*3/uL (ref 0.1–1.0)
Monocytes Relative: 6 % (ref 3.0–12.0)
Neutro Abs: 4.1 10*3/uL (ref 1.4–7.7)
Neutrophils Relative %: 45.9 % (ref 43.0–77.0)
Platelets: 193 10*3/uL (ref 150.0–400.0)
RBC: 4.72 Mil/uL (ref 4.22–5.81)
RDW: 13.3 % (ref 11.5–15.5)
WBC: 8.8 10*3/uL (ref 4.0–10.5)

## 2022-12-07 LAB — HEPATIC FUNCTION PANEL
ALT: 27 U/L (ref 0–53)
AST: 21 U/L (ref 0–37)
Albumin: 4.7 g/dL (ref 3.5–5.2)
Alkaline Phosphatase: 62 U/L (ref 39–117)
Bilirubin, Direct: 0.1 mg/dL (ref 0.0–0.3)
Total Bilirubin: 0.4 mg/dL (ref 0.2–1.2)
Total Protein: 7.2 g/dL (ref 6.0–8.3)

## 2022-12-07 LAB — BASIC METABOLIC PANEL
BUN: 21 mg/dL (ref 6–23)
CO2: 27 mEq/L (ref 19–32)
Calcium: 9.6 mg/dL (ref 8.4–10.5)
Chloride: 100 mEq/L (ref 96–112)
Creatinine, Ser: 0.94 mg/dL (ref 0.40–1.50)
GFR: 80.72 mL/min (ref >60.00)
Glucose, Bld: 90 mg/dL (ref 70–99)
Potassium: 3.9 mEq/L (ref 3.5–5.1)
Sodium: 136 mEq/L (ref 135–145)

## 2022-12-07 LAB — LIPID PANEL
Cholesterol: 266 mg/dL — ABNORMAL HIGH (ref 0–200)
HDL: 54.6 mg/dL (ref >39.00)
LDL Cholesterol: 197 mg/dL — ABNORMAL HIGH (ref 0–99)
NonHDL: 211.03
Total CHOL/HDL Ratio: 5
Triglycerides: 71 mg/dL (ref 0.0–149.0)
VLDL: 14.2 mg/dL (ref 0.0–40.0)

## 2022-12-07 LAB — TSH: TSH: 2.59 u[IU]/mL (ref 0.35–5.50)

## 2022-12-07 LAB — PSA: PSA: 1.91 ng/mL (ref 0.10–4.00)

## 2022-12-07 NOTE — Patient Instructions (Signed)
Health Maintenance, Male Adopting a healthy lifestyle and getting preventive care are important in promoting health and wellness. Ask your health care provider about: The right schedule for you to have regular tests and exams. Things you can do on your own to prevent diseases and keep yourself healthy. What should I know about diet, weight, and exercise? Eat a healthy diet  Eat a diet that includes plenty of vegetables, fruits, low-fat dairy products, and lean protein. Do not eat a lot of foods that are high in solid fats, added sugars, or sodium. Maintain a healthy weight Body mass index (BMI) is a measurement that can be used to identify possible weight problems. It estimates body fat based on height and weight. Your health care provider can help determine your BMI and help you achieve or maintain a healthy weight. Get regular exercise Get regular exercise. This is one of the most important things you can do for your health. Most adults should: Exercise for at least 150 minutes each week. The exercise should increase your heart rate and make you sweat (moderate-intensity exercise). Do strengthening exercises at least twice a week. This is in addition to the moderate-intensity exercise. Spend less time sitting. Even light physical activity can be beneficial. Watch cholesterol and blood lipids Have your blood tested for lipids and cholesterol at 73 years of age, then have this test every 5 years. You may need to have your cholesterol levels checked more often if: Your lipid or cholesterol levels are high. You are older than 73 years of age. You are at high risk for heart disease. What should I know about cancer screening? Many types of cancers can be detected early and may often be prevented. Depending on your health history and family history, you may need to have cancer screening at various ages. This may include screening for: Colorectal cancer. Prostate cancer. Skin cancer. Lung  cancer. What should I know about heart disease, diabetes, and high blood pressure? Blood pressure and heart disease High blood pressure causes heart disease and increases the risk of stroke. This is more likely to develop in people who have high blood pressure readings or are overweight. Talk with your health care provider about your target blood pressure readings. Have your blood pressure checked: Every 3-5 years if you are 18-39 years of age. Every year if you are 40 years old or older. If you are between the ages of 65 and 75 and are a current or former smoker, ask your health care provider if you should have a one-time screening for abdominal aortic aneurysm (AAA). Diabetes Have regular diabetes screenings. This checks your fasting blood sugar level. Have the screening done: Once every three years after age 45 if you are at a normal weight and have a low risk for diabetes. More often and at a younger age if you are overweight or have a high risk for diabetes. What should I know about preventing infection? Hepatitis B If you have a higher risk for hepatitis B, you should be screened for this virus. Talk with your health care provider to find out if you are at risk for hepatitis B infection. Hepatitis C Blood testing is recommended for: Everyone born from 1945 through 1965. Anyone with known risk factors for hepatitis C. Sexually transmitted infections (STIs) You should be screened each year for STIs, including gonorrhea and chlamydia, if: You are sexually active and are younger than 73 years of age. You are older than 73 years of age and your   health care provider tells you that you are at risk for this type of infection. Your sexual activity has changed since you were last screened, and you are at increased risk for chlamydia or gonorrhea. Ask your health care provider if you are at risk. Ask your health care provider about whether you are at high risk for HIV. Your health care provider  may recommend a prescription medicine to help prevent HIV infection. If you choose to take medicine to prevent HIV, you should first get tested for HIV. You should then be tested every 3 months for as long as you are taking the medicine. Follow these instructions at home: Alcohol use Do not drink alcohol if your health care provider tells you not to drink. If you drink alcohol: Limit how much you have to 0-2 drinks a day. Know how much alcohol is in your drink. In the [place] one drink equals one 12 oz bottle of beer (355 mL), one 5 oz glass of wine (148 mL), or one 1 oz glass of hard liquor (44 mL). Lifestyle Do not use any products that contain nicotine or tobacco. These products include cigarettes, chewing tobacco, and vaping devices, such as e-cigarettes. If you need help quitting, ask your health care provider. Do not use street drugs. Do not share needles. Ask your health care provider for help if you need support or information about quitting drugs. General instructions Schedule regular health, dental, and eye exams. Stay current with your vaccines. Tell your health care provider if: You often feel depressed. You have ever been abused or do not feel safe at home. Summary Adopting a healthy lifestyle and getting preventive care are important in promoting health and wellness. Follow your health care provider's instructions about healthy diet, exercising, and getting tested or screened for diseases. Follow your health care provider's instructions on monitoring your cholesterol and blood pressure. This information is not intended to replace advice given to you by your health care provider. Make sure you discuss any questions you have with your health care provider. Document Revised: 12/23/2020 Document Reviewed: 12/23/2020 Elsevier Patient Education  2023 [employer]  

## 2022-12-07 NOTE — Progress Notes (Signed)
Subjective:  Patient ID: Ronald Franklin, male    DOB: 05-02-1951  Age: 72 y.o. MRN: [id number]  CC: Annual Exam, Hypertension, and Hyperlipidemia   HPI Ronald Franklin presents for a CPX and f/up ---  He is active and denies chest pain, shortness of breath, diaphoresis, or edema.  Outpatient Medications Prior to Visit  Medication Sig Dispense Refill   Ascorbic Acid (VITAMIN C) 1000 MG tablet Take 2,000 mg by mouth daily.     Cholecalciferol (VITAMIN D-3) 125 MCG (5000 UT) TABS Take 5,000 Units by mouth daily.     Menaquinone-7 (VITAMIN K2 PO) Take 1 tablet by mouth daily.     Misc Natural Products (OSTEO BI-FLEX TRIPLE STRENGTH PO) Take 1 tablet by mouth daily.     Multiple Vitamin (MULTIVITAMIN WITH MINERALS) TABS tablet Take 1 tablet by mouth daily. One-A-Day Men's Health     Turmeric 500 MG CAPS Take 500 mg by mouth daily.      vitamin E 400 UNIT capsule Take 800 Units by mouth daily.     Zinc 50 MG TABS Take 50 mg by mouth daily.     doxycycline (VIBRA-TABS) 100 MG tablet Take 1 tablet (100 mg total) by mouth 2 (two) times daily. (Patient not taking: Reported on 12/01/2021) 28 tablet 0   mupirocin ointment (BACTROBAN) 2 % On leg wound w/dressing change qd or bid (Patient not taking: Reported on 12/01/2021) 30 g 0   No facility-administered medications prior to visit.    ROS Review of Systems  Constitutional: Negative.  Negative for chills, diaphoresis, fatigue and fever.  HENT: Negative.    Eyes: Negative.   Respiratory:  Positive for apnea. Negative for cough, choking, shortness of breath and wheezing.   Cardiovascular:  Negative for chest pain, palpitations and leg swelling.  Gastrointestinal:  Negative for abdominal pain, diarrhea, nausea and vomiting.  Endocrine: Negative.   Genitourinary: Negative.  Negative for difficulty urinating.  Musculoskeletal: Negative.  Negative for arthralgias and myalgias.  Skin: Negative.  Negative for color change, pallor and rash.   Neurological:  Negative for dizziness, syncope, weakness and light-headedness.  Hematological:  Negative for adenopathy. Does not bruise/bleed easily.  Psychiatric/Behavioral: Negative.      Objective:  BP (!) 142/82 (BP Location: Left Arm, Patient Position: Sitting, Cuff Size: Large)   Pulse (!) 58   Temp 97.9 F (36.6 C) (Oral)   Ht 6' (1.829 m)   Wt 209 lb (94.8 kg)   SpO2 96%   BMI 28.35 kg/m   BP Readings from Last 3 Encounters:  12/07/22 (!) 142/82  12/23/20 134/78  11/19/20 122/68    Wt Readings from Last 3 Encounters:  12/07/22 209 lb (94.8 kg)  11/19/20 208 lb (94.3 kg)  11/19/20 208 lb (94.3 kg)    Physical Exam Vitals reviewed. Exam conducted with a chaperone present.  HENT:     Nose: Nose normal.     Mouth/Throat:     Mouth: Mucous membranes are moist.  Eyes:     General: No scleral icterus.    Conjunctiva/sclera: Conjunctivae normal.  Cardiovascular:     Rate and Rhythm: Regular rhythm. Bradycardia present.     Pulses: Normal pulses.     Heart sounds: No murmur heard.    No friction rub. No gallop.     Comments: EKG- SB with SA, 53 bpm Incomplete RBBB No LVH or Q waves Unchanged Pulmonary:     Effort: Pulmonary effort is normal.     Breath  sounds: No stridor. No wheezing, rhonchi or rales.  Abdominal:     Palpations: There is no mass.     Tenderness: There is no abdominal tenderness. There is no guarding or rebound.     Hernia: No hernia is present. There is no hernia in the left inguinal area or right inguinal area.  Genitourinary:    Pubic Area: No rash.      Penis: Normal and circumcised.      Testes: Normal.     Epididymis:     Right: Normal.     Left: Normal.     Prostate: Enlarged. Not tender and no nodules present.     Rectum: Normal. Guaiac result negative. No mass, tenderness, anal fissure, external hemorrhoid or internal hemorrhoid. Normal anal tone.  Musculoskeletal:     Cervical back: Neck supple.     Right lower leg: No  edema.     Left lower leg: No edema.  Lymphadenopathy:     Cervical: No cervical adenopathy.     Lower Body: No right inguinal adenopathy. No left inguinal adenopathy.  Skin:    General: Skin is warm and dry.  Neurological:     General: No focal deficit present.     Mental Status: He is alert. Mental status is at baseline.  Psychiatric:        Mood and Affect: Mood normal.        Behavior: Behavior normal.     Lab Results  Component Value Date   WBC 8.8 12/07/2022   HGB 14.9 12/07/2022   HCT 43.7 12/07/2022   PLT 193.0 12/07/2022   GLUCOSE 90 12/07/2022   CHOL 266 (H) 12/07/2022   TRIG 71.0 12/07/2022   HDL 54.60 12/07/2022   LDLDIRECT 206.6 08/04/2013   LDLCALC 197 (H) 12/07/2022   ALT 27 12/07/2022   AST 21 12/07/2022   NA 136 12/07/2022   K 3.9 12/07/2022   CL 100 12/07/2022   CREATININE 0.94 12/07/2022   BUN 21 12/07/2022   CO2 27 12/07/2022   TSH 2.59 12/07/2022   PSA 1.91 12/07/2022   INR 1.0 07/02/2008   HGBA1C 5.8 11/06/2020    CT ELBOW LEFT WO CONTRAST  Result Date: 04/28/2022 CLINICAL DATA:  Arthritis, unable to straighten arm. Pain for few months. No known injury. EXAM: CT OF THE UPPER LEFT EXTREMITY WITHOUT CONTRAST TECHNIQUE: Multidetector CT imaging of the upper left extremity was performed according to the standard protocol. RADIATION DOSE REDUCTION: This exam was performed according to the departmental dose-optimization program which includes automated exposure control, adjustment of the mA and/or kV according to patient size and/or use of iterative reconstruction technique. COMPARISON:  None Available. FINDINGS: Bones/Joint/Cartilage There is advanced arthritis of the elbow joint with joint space narrowing and marginal osteophytes. There are ossified loose bodies about the anterior and posterior aspect of the elbow joint in the coronoid and olecranon fossa. There are at least 3 ossified bodies in the olecranon fossa the largest measuring up to 1 cm. There  are at least 2 ossified foreign bodies in the anterior aspect of the elbow joint in the coronoid fossa the larger measuring up to 1 cm. Ligaments Suboptimally assessed by CT. Muscles and Tendons Muscles are normal in bulk. No evidence of tendon tear. Biceps and triceps tendons appear intact. Soft tissues Skin and subcutaneous soft tissues are within normal limits. IMPRESSION: 1. Advanced elbow joint osteoarthritis with joint space narrowing and marginal osteophytes with multiple anterior and posterior intra-articular ossified bodies as described  above. 2.  Muscles and tendons appear intact. 3.  Small joint effusion, likely reactive. 4.  No acute fracture or dislocation. Electronically Signed   By: Larose Hires D.O.   On: 04/28/2022 12:19    Assessment & Plan:   Essential hypertension- He has not achieved his blood pressure goal of 130/80.  Labs are negative for secondary causes and endorgan damage.  EKG is negative for LVH.  Will start an ARB. -     CBC with Differential/Platelet; Future -     Hepatic function panel; Future -     TSH; Future -     Urinalysis, Routine w reflex microscopic; Future -     Basic metabolic panel; Future -     EKG 12-Lead -     Olmesartan Medoxomil; Take 1 tablet (20 mg total) by mouth daily.  Dispense: 90 tablet; Refill: 1  Hyperlipidemia with target LDL less than 160- He has an elevated ASCVD risk score.  I recommended that he take a statin for CV risk reduction and to undergo a coronary calcium score. -     Lipid panel; Future -     Hepatic function panel; Future -     CT CARDIAC SCORING (SELF PAY ONLY); Future -     Rosuvastatin Calcium; Take 1 tablet (20 mg total) by mouth daily.  Dispense: 90 tablet; Refill: 1  Encounter for general adult medical examination with abnormal findings- Exam completed, labs reviewed, vaccines reviewed, cancer screenings are up-to-date, patient education was given.  Bradycardia- He has no symptoms related this and chronotropic  competence. -     TSH; Future  Benign prostatic hyperplasia without lower urinary tract symptoms -     Urinalysis, Routine w reflex microscopic; Future -     PSA; Future  Abnormal electrocardiogram -     CT CARDIAC SCORING (SELF PAY ONLY); Future     Follow-up: Return in about 6 months (around 06/08/2023).  Sanda Linger, MD

## 2022-12-08 LAB — URINALYSIS, ROUTINE W REFLEX MICROSCOPIC
Bilirubin Urine: NEGATIVE
Hgb urine dipstick: NEGATIVE
Ketones, ur: NEGATIVE
Leukocytes,Ua: NEGATIVE
Nitrite: NEGATIVE
RBC / HPF: NONE SEEN (ref 0–?)
Specific Gravity, Urine: 1.02 (ref 1.000–1.030)
Total Protein, Urine: NEGATIVE
Urine Glucose: NEGATIVE
Urobilinogen, UA: 0.2 (ref 0.0–1.0)
WBC, UA: NONE SEEN (ref 0–?)
pH: 6 (ref 5.0–8.0)

## 2022-12-08 MED ORDER — OLMESARTAN MEDOXOMIL 20 MG PO TABS
20.0000 mg | ORAL_TABLET | Freq: Every day | ORAL | 1 refills | Status: DC
Start: 2022-12-08 — End: 2023-06-17

## 2022-12-08 MED ORDER — ROSUVASTATIN CALCIUM 20 MG PO TABS
20.0000 mg | ORAL_TABLET | Freq: Every day | ORAL | 1 refills | Status: DC
Start: 2022-12-08 — End: 2023-06-17

## 2023-01-08 ENCOUNTER — Telehealth: Payer: Self-pay | Admitting: Internal Medicine

## 2023-01-08 NOTE — Telephone Encounter (Signed)
Pt called wanted to know if  Dr. Yetta Barre can put in ct scan so he can check his spleen. Please advise.

## 2023-01-14 ENCOUNTER — Encounter: Payer: Self-pay | Admitting: Internal Medicine

## 2023-01-15 ENCOUNTER — Other Ambulatory Visit: Payer: Self-pay | Admitting: Internal Medicine

## 2023-01-15 DIAGNOSIS — I728 Aneurysm of other specified arteries: Secondary | ICD-10-CM

## 2023-01-26 ENCOUNTER — Other Ambulatory Visit: Payer: Self-pay | Admitting: Orthopaedic Surgery

## 2023-01-26 DIAGNOSIS — M79671 Pain in right foot: Secondary | ICD-10-CM | POA: Insufficient documentation

## 2023-01-26 DIAGNOSIS — M2021 Hallux rigidus, right foot: Secondary | ICD-10-CM

## 2023-02-03 ENCOUNTER — Ambulatory Visit
Admission: RE | Admit: 2023-02-03 | Discharge: 2023-02-03 | Disposition: A | Discharge status: Home / Self Care | Payer: Medicare HMO | Source: Ambulatory Visit | Attending: Orthopaedic Surgery | Admitting: Orthopaedic Surgery

## 2023-02-03 DIAGNOSIS — M2021 Hallux rigidus, right foot: Secondary | ICD-10-CM

## 2023-02-08 ENCOUNTER — Ambulatory Visit
Admission: RE | Admit: 2023-02-08 | Discharge: 2023-02-08 | Disposition: A | Discharge status: Home / Self Care | Payer: Medicare HMO | Source: Ambulatory Visit | Attending: Internal Medicine | Admitting: Internal Medicine

## 2023-02-08 ENCOUNTER — Ambulatory Visit
Admission: RE | Admit: 2023-02-08 | Discharge: 2023-02-08 | Disposition: A | Discharge status: Home / Self Care | Payer: No Typology Code available for payment source | Source: Ambulatory Visit | Attending: Internal Medicine | Admitting: Internal Medicine

## 2023-02-08 DIAGNOSIS — M1712 Unilateral primary osteoarthritis, left knee: Secondary | ICD-10-CM | POA: Insufficient documentation

## 2023-02-08 DIAGNOSIS — I728 Aneurysm of other specified arteries: Secondary | ICD-10-CM

## 2023-02-08 DIAGNOSIS — R9431 Abnormal electrocardiogram [ECG] [EKG]: Secondary | ICD-10-CM

## 2023-02-08 DIAGNOSIS — E785 Hyperlipidemia, unspecified: Secondary | ICD-10-CM

## 2023-02-08 MED ORDER — IOPAMIDOL (ISOVUE-370) INJECTION 76%
75.0000 mL | Freq: Once | INTRAVENOUS | Status: AC | PRN
  Administered 2023-02-08: 75 mL via INTRAVENOUS

## 2023-02-09 ENCOUNTER — Other Ambulatory Visit: Payer: Self-pay | Admitting: Internal Medicine

## 2023-02-09 DIAGNOSIS — R931 Abnormal findings on diagnostic imaging of heart and coronary circulation: Secondary | ICD-10-CM | POA: Insufficient documentation

## 2023-02-10 NOTE — Progress Notes (Unsigned)
Cardiology Clinic Note   Patient Name: Ronald Franklin Date of Encounter: 02/11/2023  Primary Care Provider:  Etta Grandchild, MD Primary Cardiologist:  None  Patient Profile    Ronald Franklin 72 year old male presents to the clinic today for follow-up evaluation of his atrial flutter and hyperlipidemia.  Past Medical History    Past Medical History:  Diagnosis Date   Allergic rhinitis    Arthritis    CTS (carpal tunnel syndrome)    Hypertension    OSA (obstructive sleep apnea)    Type II diabetes mellitus with manifestations (HCC) 09/20/2020   Past Surgical History:  Procedure Laterality Date   BACK SURGERY     C4-5-6   2 cervical    from AA   CARDIOVERSION N/A 09/20/2020   Procedure: CARDIOVERSION;  Surgeon: Thurmon Fair, MD;  Location: [facility];  Service: Cardiovascular;  Laterality: N/A;   CARPAL TUNNEL RELEASE  09/16/2011   Procedure: CARPAL TUNNEL RELEASE;  Surgeon: Karn Cassis, MD;  Location: [facility];  Service: Neurosurgery;  Laterality: Left;  Left Carpal Tunnel Release   CERVICAL LAMINECTOMY     EXTRACORPOREAL SHOCK WAVE LITHOTRIPSY Left 09/16/2020   Procedure: EXTRACORPOREAL SHOCK WAVE LITHOTRIPSY (ESWL);  Surgeon: Heloise Purpura, MD;  Location: [facility];  Service: Urology;  Laterality: Left;   KNEE ARTHROSCOPY WITH MEDIAL MENISECTOMY  07/06/2012   Procedure: KNEE ARTHROSCOPY WITH MEDIAL MENISECTOMY;  Surgeon: Drucilla Schmidt, MD;  Location: [facility];  Service: Orthopedics;  Laterality: Left;  LEFT KNEE ARTHROSCOPY WITH PARTIAL MEDIAL MENISECTOMY   KNEE SURGERY     NECK SURGERY     ROTATOR CUFF REPAIR     bilateral    TEE WITHOUT CARDIOVERSION N/A 09/20/2020   Procedure: TRANSESOPHAGEAL ECHOCARDIOGRAM (TEE);  Surgeon: Thurmon Fair, MD;  Location: [facility] ENDOSCOPY;  Service: Cardiovascular;  Laterality: N/A;   TOTAL HIP ARTHROPLASTY Left 01/19/2019   Procedure: TOTAL HIP ARTHROPLASTY ANTERIOR APPROACH;  Surgeon: Samson Frederic, MD;   Location: [facility] OR[facility];  Service: Orthopedics;  Laterality: Left;    Allergies  Allergies  Allergen Reactions   Poison Ivy Extract Itching    History of Present Illness    Ronald Franklin has a PMH of atrial flutter status post TEE with DCCV, hyperlipidemia, diabetes, and carpal tunnel syndrome.  His PMH also includes history of kidney stones.  He was seen in follow-up by Dr. Flora Lipps on 10/08/2020.  He underwent TEE/DCCV on 09/20/2020.  His EKG on follow-up showed sinus rhythm.  His diltiazem was stopped.  He was not noted to have recurrence of atrial flutter.  He denied palpitations.  He was encouraged to continue apixaban for another few weeks.  He denied bleeding issues.  Long-term anticoagulation was not recommended due to his diabetes.  He continue to work on his diet and hope to no longer be diabetic.  He was working with his PCP who was monitoring his blood glucose.  They discussed the need to monitor for atrial fibrillation.  He denied chest pain, shortness of breath, palpitations and felt he was overall doing well.  He did not require surgery for kidney stones and was able to pass on his own.  He presents to the clinic today for follow-up evaluation and states he has not had any further episodes of atrial fibrillation/flutter.  We reviewed his coronary calcium scoring and recent CT.  His PCP recommended starting statin therapy.  He was reluctant to do this.  We discussed the benefits of  therapy.  He is worried about side effects.  We discussed pharmacy lipid clinic and I will refer.  We also discussed PCSK9 inhibitors.  I will refer to pharmacy lipid clinic for further management and evaluation of cholesterol.  We will plan fasting lipids and LFTs in 8 weeks.  Today he denies chest pain, shortness of breath, lower extremity edema, fatigue, palpitations, melena, hematuria, hemoptysis, diaphoresis, weakness, presyncope, syncope, orthopnea, and PND.     Home Medications    Prior to Admission  medications   Medication Sig Start Date End Date Taking? Authorizing Provider  Ascorbic Acid (VITAMIN C) 1000 MG tablet Take 2,000 mg by mouth daily.    [provider]  Cholecalciferol (VITAMIN D-3) 125 MCG (5000 UT) TABS Take 5,000 Units by mouth daily.    [provider]  Menaquinone-7 (VITAMIN K2 PO) Take 1 tablet by mouth daily.    [provider]  Misc Natural Products (OSTEO BI-FLEX TRIPLE STRENGTH PO) Take 1 tablet by mouth daily.    [provider]  Multiple Vitamin (MULTIVITAMIN WITH MINERALS) TABS tablet Take 1 tablet by mouth daily. One-A-Day Men's Health    [provider]  olmesartan (BENICAR) 20 MG tablet Take 1 tablet (20 mg total) by mouth daily. 12/08/22   Etta Grandchild, MD  rosuvastatin (CRESTOR) 20 MG tablet Take 1 tablet (20 mg total) by mouth daily. 12/08/22   Etta Grandchild, MD  Turmeric 500 MG CAPS Take 500 mg by mouth daily.     [provider]  vitamin E 400 UNIT capsule Take 800 Units by mouth daily.    [provider]  Zinc 50 MG TABS Take 50 mg by mouth daily.    [provider]    Family History    Family History  Problem Relation Age of Onset   Diabetes Brother    Diabetes Brother    Hyperlipidemia Other    Prostate cancer Other    Heart disease Neg Hx    Early death Neg Hx    Alcohol abuse Neg Hx    Arthritis Neg Hx    Cancer Neg Hx    Stroke Neg Hx    Kidney disease Neg Hx    Hypertension Neg Hx    He indicated that his mother is deceased. He indicated that his father is deceased. He indicated that both of his sisters are alive. He indicated that all of his three brothers are alive. He indicated that the status of his neg hx is unknown.  Social History    Social History   Socioeconomic History   Marital status: Married    Spouse name: Not on file   Number of children: 6   Years of education: BS   Highest education level: Not on file  Occupational History   Occupation:  Retired Sport and exercise psychologist  Tobacco Use   Smoking status: Never   Smokeless tobacco: Never  Substance and Sexual Activity   Alcohol use: Yes    Alcohol/week: 5.0 standard drinks of alcohol    Types: 5 Glasses of wine per week    Comment: socially   Drug use: No   Sexual activity: Yes    Birth control/protection: None  Other Topics Concern   Not on file  Social History Narrative   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 Married. Has children.   Drinks 1 cup of coffee a day  Social Determinants of Health   Financial Resource Strain: Low Risk  (12/01/2021)   Overall Financial Resource Strain (CARDIA)    Difficulty of Paying Living Expenses: Not hard at all  Food Insecurity: No Food Insecurity (12/01/2021)   Hunger Vital Sign    Worried About Running Out of Food in the Last Year: Never true    Ran Out of Food in the Last Year: Never true  Transportation Needs: No Transportation Needs (12/01/2021)   PRAPARE - Administrator, Civil Service (Medical): No    Lack of Transportation (Non-Medical): No  Physical Activity: Insufficiently Active (12/01/2021)   Exercise Vital Sign    Days of Exercise per Week: 2 days    Minutes of Exercise per Session: 40 min  Stress: No Stress Concern Present (12/01/2021)   [employer] of Occupational Health - Occupational Stress Questionnaire    Feeling of Stress : Not at all  Social Connections: Moderately Isolated (12/01/2021)   Social Connection and Isolation Panel [NHANES]    Frequency of Communication with Friends and Family: Twice a week    Frequency of Social Gatherings with Friends and Family: Twice a week    Attends Religious Services: Never    Database administrator or Organizations: No    Attends Banker Meetings: Never    Marital Status: Married  Catering manager Violence: Not At Risk (12/01/2021)   Humiliation, Afraid, Rape, and Kick questionnaire     Fear of Current or Ex-Partner: No    Emotionally Abused: No    Physically Abused: No    Sexually Abused: No     Review of Systems    General:  No chills, fever, night sweats or weight changes.  Cardiovascular:  No chest pain, dyspnea on exertion, edema, orthopnea, palpitations, paroxysmal nocturnal dyspnea. Dermatological: No rash, lesions/masses Respiratory: No cough, dyspnea Urologic: No hematuria, dysuria Abdominal:   No nausea, vomiting, diarrhea, bright red blood per rectum, melena, or hematemesis Neurologic:  No visual changes, wkns, changes in mental status. All other systems reviewed and are otherwise negative except as noted above.  Physical Exam    VS:  BP 124/72   Pulse 60   Ht 6' (1.829 m)   Wt 209 lb 3.2 oz (94.9 kg)   SpO2 97%   BMI 28.37 kg/m  , BMI Body mass index is 28.37 kg/m. GEN: Well nourished, well developed, in no acute distress. HEENT: normal. Neck: Supple, no JVD, carotid bruits, or masses. Cardiac: RRR, no murmurs, rubs, or gallops. No clubbing, cyanosis, edema.  Radials/DP/PT 2+ and equal bilaterally.  Respiratory:  Respirations regular and unlabored, clear to auscultation bilaterally. GI: Soft, nontender, nondistended, BS + x 4. MS: no deformity or atrophy. Skin: warm and dry, no rash. Neuro:  Strength and sensation are intact. Psych: Normal affect.  Accessory Clinical Findings    Recent Labs: 12/07/2022: ALT 27; BUN 21; Creatinine, Ser 0.94; Hemoglobin 14.9; Platelets 193.0; Potassium 3.9; Sodium 136; TSH 2.59   Recent Lipid Panel    Component Value Date/Time   CHOL 266 (H) 12/07/2022 1506   TRIG 71.0 12/07/2022 1506   HDL 54.60 12/07/2022 1506   CHOLHDL 5 12/07/2022 1506   VLDL 14.2 12/07/2022 1506   LDLCALC 197 (H) 12/07/2022 1506   LDLDIRECT 206.6 08/04/2013 1003         ECG personally reviewed by me today- Normal sinus rhythm low voltage criteria for LV H 60 bpm - No acute changes   TEE 09/20/2020  1.  Left ventricular  ejection fraction, by estimation, is 55 to 60%. The  left ventricle has normal function. The left ventricle has no regional  wall motion abnormalities. Left ventricular diastolic function could not  be evaluated.   2. Right ventricular systolic function is normal. The right ventricular  size is mildly enlarged. There is mildly elevated pulmonary artery  systolic pressure.   3. No left atrial/left atrial appendage thrombus was detected. The LAA  emptying velocity was 60 cm/s.   4. The mitral valve is normal in structure. Trivial mitral valve  regurgitation. No evidence of mitral stenosis.   5. Tricuspid valve regurgitation is mild to moderate.   6. The aortic valve is normal in structure. Aortic valve regurgitation is  not visualized. No aortic stenosis is present.   7. The inferior vena cava is normal in size with greater than 50%  respiratory variability, suggesting right atrial pressure of 3 mmHg.   Assessment & Plan   1.  Atrial flutter-EKG today shows Normal sinus rhythm low voltage criteria for LV H 60 bpm.  Denies recurrence of accelerated or irregular heartbeats.  Cardiac event monitor 10/29/2020 showed brief ectopic atrial tachycardic episodes, rare ectopy and no atrial fibrillation or flutter. Avoid triggers caffeine, chocolate, EtOH, dehydration etc. Continue to monitor  Elevated coronary calcium scoring-reviewed recent coronary calcium scoring.  Patient expressed understanding.  Encouraged starting statin therapy. Order coronary CTA for further prognostication Order CBC, BMP  Essential hypertension-BP today 124/72. Continue olmesartan Heart healthy low-sodium diet Maintain blood pressure log  Hyperlipidemia-LDL 197 on 12/07/2022.  Reluctant to start statin therapy.  Also using coconut oil and oatmeal in the morning.  Encouraged to start rosuvastatin and reviewed genetic component of elevated cholesterol. High-fiber diet Increase physical activity as tolerated Start  rosuvastatin Refer to pharmacy lipid clinic  Disposition: Follow-up with Dr. Flora Lipps or me after coronary CTA   Thomasene Ripple. Esmond Hinch NP-C     02/11/2023, 9:32 AM [facility] [address] Office [phone] Fax [phone]    I spent 15 minutes examining this patient, reviewing medications, and using patient centered shared decision making involving her cardiac care.  Prior to her visit I spent greater than 20 minutes reviewing her past medical history,  medications, and prior cardiac tests.

## 2023-02-11 ENCOUNTER — Encounter: Payer: Self-pay | Admitting: General Practice

## 2023-02-11 ENCOUNTER — Ambulatory Visit: Payer: Medicare HMO | Attending: General Practice | Admitting: General Practice

## 2023-02-11 VITALS — BP 124/72 | HR 60 | Ht 72.0 in | Wt 209.2 lb

## 2023-02-11 DIAGNOSIS — I483 Typical atrial flutter: Secondary | ICD-10-CM

## 2023-02-11 DIAGNOSIS — E785 Hyperlipidemia, unspecified: Secondary | ICD-10-CM

## 2023-02-11 DIAGNOSIS — I10 Essential (primary) hypertension: Secondary | ICD-10-CM

## 2023-02-11 DIAGNOSIS — R9431 Abnormal electrocardiogram [ECG] [EKG]: Secondary | ICD-10-CM

## 2023-02-11 NOTE — Patient Instructions (Addendum)
Medication Instructions:  The current medical regimen is effective;  continue present plan and medications as directed. Please refer to the Current Medication list given to you today.  *If you need a refill on your cardiac medications before your next appointment, please call your pharmacy*  Lab Work: FASTING LIPID AND LFT IN 8 WEEKS If you have labs (blood work) drawn today and your tests are completely normal, you will receive your results only by:  MyChart   Message (if you have MyChart) OR  A paper copy in the mail If you have any lab test that is abnormal or we need to change your treatment, we will call you to review the results.  Testing/Procedures: CTA scanning, (CAT scanning), is a noninvasive, special x-ray that produces cross-sectional images of the body using x-rays and a computer. CT scans help physicians diagnose and treat medical conditions. For some CT exams, a contrast material is used to enhance visibility in the area of the body being studied. CT scans provide greater clarity and reveal more details than regular x-ray exams.  Other Instructions AMB REFERRAL TO LIPID CLINIC PHARMACIST  Follow-Up: At [facility], you and your health needs are our priority.  As part of our continuing mission to provide you with exceptional heart care, we have created designated Provider Care Teams.  These Care Teams include your primary Cardiologist (physician) and Advanced Practice Providers (APPs -  Physician Assistants and Nurse Practitioners) who all work together to provide you with the care you need, when you need it.  Your next appointment:   AFTER CTA   Provider:   Reatha Harps, MD  or Edd Fabian, FNP

## 2023-02-17 ENCOUNTER — Encounter (HOSPITAL_COMMUNITY): Payer: Self-pay

## 2023-02-22 ENCOUNTER — Ambulatory Visit (HOSPITAL_COMMUNITY)
Admission: RE | Admit: 2023-02-22 | Discharge: 2023-02-22 | Disposition: A | Discharge status: Home / Self Care | Payer: Medicare HMO | Source: Ambulatory Visit | Attending: General Practice | Admitting: General Practice

## 2023-02-22 ENCOUNTER — Ambulatory Visit (HOSPITAL_COMMUNITY)
Admission: RE | Admit: 2023-02-22 | Discharge: 2023-02-22 | Disposition: A | Discharge status: Home / Self Care | Payer: Medicare HMO | Source: Ambulatory Visit | Attending: Cardiovascular Disease | Admitting: Cardiovascular Disease

## 2023-02-22 ENCOUNTER — Other Ambulatory Visit: Payer: Self-pay | Admitting: Cardiovascular Disease

## 2023-02-22 DIAGNOSIS — R9431 Abnormal electrocardiogram [ECG] [EKG]: Secondary | ICD-10-CM | POA: Diagnosis present

## 2023-02-22 DIAGNOSIS — R931 Abnormal findings on diagnostic imaging of heart and coronary circulation: Secondary | ICD-10-CM | POA: Diagnosis present

## 2023-02-22 DIAGNOSIS — I2510 Atherosclerotic heart disease of native coronary artery without angina pectoris: Secondary | ICD-10-CM

## 2023-02-22 LAB — POCT I-STAT CREATININE: Creatinine, Ser: 1.1 mg/dL (ref 0.61–1.24)

## 2023-02-22 MED ORDER — IOHEXOL 350 MG/ML SOLN
100.0000 mL | Freq: Once | INTRAVENOUS | Status: AC | PRN
  Administered 2023-02-22: 100 mL via INTRAVENOUS

## 2023-02-22 MED ORDER — METOPROLOL TARTRATE 5 MG/5ML IV SOLN
INTRAVENOUS | Status: AC
  Filled 2023-02-22: qty 20

## 2023-02-22 MED ORDER — NITROGLYCERIN 0.4 MG SL SUBL
SUBLINGUAL_TABLET | SUBLINGUAL | Status: AC
  Filled 2023-02-22: qty 2

## 2023-02-22 MED ORDER — NITROGLYCERIN 0.4 MG SL SUBL
0.8000 mg | SUBLINGUAL_TABLET | Freq: Once | SUBLINGUAL | Status: AC
  Administered 2023-02-22: 0.8 mg via SUBLINGUAL

## 2023-02-22 MED ORDER — METOPROLOL TARTRATE 5 MG/5ML IV SOLN
10.0000 mg | INTRAVENOUS | Status: DC | PRN
  Administered 2023-02-22: 5 mg via INTRAVENOUS

## 2023-02-22 NOTE — Progress Notes (Signed)
CT FFR ordered.  Gerri Spore T. Flora Lipps, MD, [facility]  [facility]  [address], [address] [place], [place] [address] [phone]  4:02 PM

## 2023-02-24 ENCOUNTER — Telehealth: Payer: Self-pay | Admitting: General Practice

## 2023-02-24 ENCOUNTER — Encounter: Payer: Self-pay | Admitting: Cardiovascular Disease

## 2023-02-24 ENCOUNTER — Telehealth: Payer: Self-pay

## 2023-02-24 NOTE — Telephone Encounter (Signed)
-----   Message from [facility] sent at 02/24/2023  8:46 AM EDT ----- Regarding: RE: PHARMD appt Patient was called on 07/03 and did not wish to schedule. [place] and does not drive to [place] often. Does he need to be called again? His referral was closed.  ----- Message ----- From: Alyson Ingles, LPN Sent: 1/61/0960   7:46 AM EDT To: Cv Div Nl Scheduling Subject: PHARMD appt                                    Please call pt and schedule PHARMD follow up appointment  Thank you! Marcelino Duster, LPN

## 2023-02-24 NOTE — Telephone Encounter (Signed)
Pt calling to CT results

## 2023-02-24 NOTE — Telephone Encounter (Signed)
See CT results as follows: Pt informed of providers result & recommendations. Pt verbalized understanding. All questions, if any, were answered. Notified that someone will be calling to schedule PHARMD appt, to discuss lipid next steps also, if no call he will call back and schedule this appt. Alyson Ingles, LPN 11/23/8117  1:47 PM EDT

## 2023-02-24 NOTE — Telephone Encounter (Signed)
Spoke to the pt, he will like an update pertaining to his test results. Will forward to MD and nurse for advise.

## 2023-03-15 ENCOUNTER — Ambulatory Visit: Payer: Medicare HMO | Admitting: Internal Medicine

## 2023-03-29 ENCOUNTER — Telehealth: Payer: Self-pay | Admitting: Internal Medicine

## 2023-03-29 NOTE — Telephone Encounter (Signed)
-----   Message from Sanda Linger sent at 05/07/2022  7:59 AM EDT ----- Regarding: splenic aneurysm recheck

## 2023-04-08 NOTE — Progress Notes (Signed)
Cardiology Office Note:   Date:  04/09/2023  NAME:  Ronald Franklin    MRN: [id number] DOB:  1950-09-08   PCP:  Etta Grandchild, MD  Cardiologist:  Reatha Harps, MD  Electrophysiologist:  None   Referring MD: Etta Grandchild, MD   Chief Complaint  Patient presents with   Follow-up         History of Present Illness:   Ronald Franklin is a 72 y.o. male with a hx of CAD, DM, HLD who presents for follow-up.  Reports he is doing well.  Denies any chest pain or trouble breathing.  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  No issues.  Nonobstructive CAD based on coronary CTA.  Does have high plaque volume and high coronary calcium.  Not wanting to take a statin.  He will meet with pharmacy today to discuss options.  He is on aspirin.  Blood pressure is controlled.  No recurrence of atrial flutter.  No longer on Eliquis.  Diabetes number control.  He expresses a desire for more holistic approach.  Denies any symptoms today.  CV exam is normal.  EKG unchanged from prior.  Problem list 1.  Atrial flutter 09/17/2020  -CHADSVASC=2 (age, CAD) -TEE/DCCV 09/20/2020 2.  Hyperlipidemia -T chol 266, HDL 54, LDL 197, TG 71 3. DM -A1c 5.8  4. CAD -CAC 716 (75th percentile) -TPV 731 (54th percentile; 128 calcified; 603 non-calcified; severe) -moderate RCA 50-69%; CT FFR 0.92  Past Medical History: Past Medical History:  Diagnosis Date   Allergic rhinitis    Arthritis    CTS (carpal tunnel syndrome)    Hypertension    OSA (obstructive sleep apnea)    Type II diabetes mellitus with manifestations (HCC) 09/20/2020    Past Surgical History: Past Surgical History:  Procedure Laterality Date   BACK SURGERY     C4-5-6   2 cervical    from [facility]   CARDIOVERSION N/A 09/20/2020   Procedure: CARDIOVERSION;  Surgeon: Thurmon Fair, MD;  Location: [facility];  Service: Cardiovascular;  Laterality: N/A;   CARPAL TUNNEL RELEASE  09/16/2011   Procedure: CARPAL TUNNEL RELEASE;  Surgeon: Karn Cassis, MD;  Location: [facility];  Service: Neurosurgery;  Laterality: Left;  Left Carpal Tunnel Release   CERVICAL LAMINECTOMY     EXTRACORPOREAL SHOCK WAVE LITHOTRIPSY Left 09/16/2020   Procedure: EXTRACORPOREAL SHOCK WAVE LITHOTRIPSY (ESWL);  Surgeon: Heloise Purpura, MD;  Location: [facility];  Service: Urology;  Laterality: Left;   KNEE ARTHROSCOPY WITH MEDIAL MENISECTOMY  07/06/2012   Procedure: KNEE ARTHROSCOPY WITH MEDIAL MENISECTOMY;  Surgeon: Drucilla Schmidt, MD;  Location: [facility];  Service: Orthopedics;  Laterality: Left;  LEFT KNEE ARTHROSCOPY WITH PARTIAL MEDIAL MENISECTOMY   KNEE SURGERY     NECK SURGERY     ROTATOR CUFF REPAIR     bilateral    TEE WITHOUT CARDIOVERSION N/A 09/20/2020   Procedure: TRANSESOPHAGEAL ECHOCARDIOGRAM (TEE);  Surgeon: Thurmon Fair, MD;  Location: [facility] ENDOSCOPY;  Service: Cardiovascular;  Laterality: N/A;   TOTAL HIP ARTHROPLASTY Left 01/19/2019   Procedure: TOTAL HIP ARTHROPLASTY ANTERIOR APPROACH;  Surgeon: Samson Frederic, MD;  Location: [facility];  Service: Orthopedics;  Laterality: Left;    Current Medications: Current Meds  Medication Sig   Ascorbic Acid (VITAMIN C) 1000 MG tablet Take 2,000 mg by mouth daily.   Cholecalciferol (VITAMIN D-3) 125 MCG (5000 UT) TABS Take 5,000 Units by mouth daily.   Menaquinone-7 (VITAMIN K2  PO) Take 1 tablet by mouth daily.   Misc Natural Products (OSTEO BI-FLEX TRIPLE STRENGTH PO) Take 1 tablet by mouth daily.   Multiple Vitamin (MULTIVITAMIN WITH MINERALS) TABS tablet Take 1 tablet by mouth daily. One-A-Day Men's Health   olmesartan (BENICAR) 20 MG tablet Take 1 tablet (20 mg total) by mouth daily.   Turmeric 500 MG CAPS Take 500 mg by mouth daily.    vitamin E 400 UNIT capsule Take 800 Units by mouth daily.   VOLTAREN 1 % GEL Apply topically 4 (four) times daily.   Zinc 50 MG TABS Take 50 mg by mouth daily.     Allergies:    Poison ivy extract   Social History: Social History    Socioeconomic History   Marital status: Married    Spouse name: Not on file   Number of children: 6   Years of education: BS   Highest education level: Not on file  Occupational History   Occupation: Retired Sport and exercise psychologist  Tobacco Use   Smoking status: Never   Smokeless tobacco: Never  Substance and Sexual Activity   Alcohol use: Yes    Alcohol/week: 5.0 standard drinks of alcohol    Types: 5 Glasses of wine per week    Comment: socially   Drug use: No   Sexual activity: Yes    Birth control/protection: None  Other Topics Concern   Not on file  Social History Narrative   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 Married. Has children.   Drinks 1 cup of coffee a day    Social Determinants of Health   Financial Resource Strain: Low Risk  (12/01/2021)   Overall Financial Resource Strain (CARDIA)    Difficulty of Paying Living Expenses: Not hard at all  Food Insecurity: Unknown (08/19/2022)   Received from [employer] Insecurity    In the past 3 months, have you had to go without food for 24 hours, multiple times due to lack of resources?: Not on file  Transportation Needs: Unknown (08/19/2022)   Received from [employer] Needs    In the past 3 months, has lack of transporation kept you from medical appointments or getting things you need that are essential to your health?: Not on file  Physical Activity: Insufficiently Active (12/01/2021)   Exercise Vital Sign    Days of Exercise per Week: 2 days    Minutes of Exercise per Session: 40 min  Stress: No Stress Concern Present (12/01/2021)   [employer] of Occupational Health - Occupational Stress Questionnaire    Feeling of Stress : Not at all  Social Connections: Unknown (10/09/2022)   Received from [facility]   Social Connections    In the past 3 months, do you feel that you lack companionship or social support?: Not on file      Family History: The patient's family history includes Diabetes in his brother and brother; Hyperlipidemia in an other family member; Prostate cancer in an other family member. There is no history of Heart disease, Early death, Alcohol abuse, Arthritis, Cancer, Stroke, Kidney disease, or Hypertension.  ROS:   All other ROS reviewed and negative. Pertinent positives noted in the HPI.     EKGs/Labs/Other Studies Reviewed:   The following studies were personally reviewed by me today:  EKG:  EKG is ordered today.    EKG Interpretation Date/Time:  Friday April 09 2023  13:04:48 EDT Ventricular Rate:  59 PR Interval:  172 QRS Duration:  98 QT Interval:  388 QTC Calculation: 384 R Axis:   -16  Text Interpretation: Sinus bradycardia Minimal voltage criteria for LVH, may be normal variant ( R in aVL ) Confirmed by Lennie Odor [phone]) on 04/09/2023 1:23:58 PM   Recent Labs: 12/07/2022: ALT 27; BUN 21; Hemoglobin 14.9; Platelets 193.0; Potassium 3.9; Sodium 136; TSH 2.59 02/22/2023: Creatinine, Ser 1.10   Recent Lipid Panel    Component Value Date/Time   CHOL 266 (H) 12/07/2022 1506   TRIG 71.0 12/07/2022 1506   HDL 54.60 12/07/2022 1506   CHOLHDL 5 12/07/2022 1506   VLDL 14.2 12/07/2022 1506   LDLCALC 197 (H) 12/07/2022 1506   LDLDIRECT 206.6 08/04/2013 1003    Physical Exam:   VS:  BP 130/82   Pulse 98   Ht 6' (1.829 m)   Wt 202 lb 3.2 oz (91.7 kg)   SpO2 (!) 59%   BMI 27.42 kg/m    Wt Readings from Last 3 Encounters:  04/09/23 202 lb 3.2 oz (91.7 kg)  02/11/23 209 lb 3.2 oz (94.9 kg)  12/07/22 209 lb (94.8 kg)    General: Well nourished, well developed, in no acute distress Head: Atraumatic, normal size  Eyes: PEERLA, EOMI  Neck: Supple, no JVD Endocrine: No thryomegaly Cardiac: Normal S1, S2; RRR; no murmurs, rubs, or gallops Lungs: Clear to auscultation bilaterally, no wheezing, rhonchi or rales  Abd: Soft, nontender, no hepatomegaly  Ext: No edema, pulses  2+ Musculoskeletal: No deformities, BUE and BLE strength normal and equal Skin: Warm and dry, no rashes   Neuro: Alert and oriented to person, place, time, and situation, CNII-XII grossly intact, no focal deficits  Psych: Normal mood and affect   ASSESSMENT:   Ronald Franklin is a 73 y.o. male who presents for the following: 1. Coronary artery disease involving native coronary artery of native heart without angina pectoris   2. Mixed hyperlipidemia   3. Typical atrial flutter (HCC)   4. Essential hypertension     PLAN:   1. Coronary artery disease involving native coronary artery of native heart without angina pectoris 2. Mixed hyperlipidemia -Nonobstructive disease but very high plaque volume.  Not wanting to take a statin.  He will meet with pharmacy today to discuss nonstatin options.  He tells me he would like to research this.  He has no signs of heart failure.  His EKG is nonischemic.  No symptoms of angina.  We will continue with aspirin.  No need for other medications at this time.  He will see [place] yearly.  If he decides to get on a cholesterol medication we will set him up for pharmacy for this.  3. Typical atrial flutter (HCC) -Status post TEE/cardioversion in 2022.  No longer taking Eliquis.  He reports he does not want to take this.  Okay to continue with aspirin.  4. Essential hypertension -Welcome hold on current medications.  No changes.      Disposition: Return in about 1 year (around 04/08/2024).  Medication Adjustments/Labs and Tests Ordered: Current medicines are reviewed at length with the patient today.  Concerns regarding medicines are outlined above.  Orders Placed This Encounter  Procedures   EKG 12-Lead   No orders of the defined types were placed in this encounter.  Patient Instructions  Medication Instructions:  Continue same medications *If you need a refill on your cardiac medications before your next appointment, please call your  pharmacy*   Lab  Work: None ordered   Testing/Procedures: None ordered   Follow-Up: At [facility], you and your health needs are our priority.  As part of our continuing mission to provide you with exceptional heart care, we have created designated Provider Care Teams.  These Care Teams include your primary Cardiologist (physician) and Advanced Practice Providers (APPs -  Physician Assistants and Nurse Practitioners) who all work together to provide you with the care you need, when you need it.  We recommend signing up for the patient portal called "MyChart".  Sign up information is provided on this After Visit Summary.  MyChart is used to connect with patients for Virtual Visits (Telemedicine).  Patients are able to view lab/test results, encounter notes, upcoming appointments, etc.  Non-urgent messages can be sent to your provider as well.   To learn more about what you can do with MyChart, go to [email or web address].    Your next appointment:  1 year    Call in May to schedule August appointment     Provider:  Dr.Josphine Laffey's PA      Time Spent with Patient: I have spent a total of 25 minutes with patient reviewing hospital notes, telemetry, EKGs, labs and examining the patient as well as establishing an assessment and plan that was discussed with the patient.  > 50% of time was spent in direct patient care.  Signed, Lenna Gilford. Flora Lipps, MD, [facility]  [facility]  [address], [address] [place], [place] [address] [phone]  04/09/2023 2:38 PM

## 2023-04-09 ENCOUNTER — Telehealth: Payer: Self-pay | Admitting: Pharmacist Clinician (PhC)/ Clinical Pharmacy Specialist

## 2023-04-09 ENCOUNTER — Encounter: Payer: Self-pay | Admitting: Cardiovascular Disease

## 2023-04-09 ENCOUNTER — Ambulatory Visit: Payer: Medicare HMO

## 2023-04-09 ENCOUNTER — Ambulatory Visit: Payer: Medicare HMO | Attending: Cardiovascular Disease | Admitting: Cardiovascular Disease

## 2023-04-09 VITALS — BP 130/82 | HR 98 | Ht 72.0 in | Wt 202.2 lb

## 2023-04-09 DIAGNOSIS — I483 Typical atrial flutter: Secondary | ICD-10-CM

## 2023-04-09 DIAGNOSIS — I10 Essential (primary) hypertension: Secondary | ICD-10-CM

## 2023-04-09 DIAGNOSIS — I2510 Atherosclerotic heart disease of native coronary artery without angina pectoris: Secondary | ICD-10-CM

## 2023-04-09 DIAGNOSIS — E782 Mixed hyperlipidemia: Secondary | ICD-10-CM

## 2023-04-09 NOTE — Progress Notes (Deleted)
Office Visit    Patient Name: Ronald Franklin Date of Encounter: 04/09/2023  Primary Care Provider:  Etta Grandchild, MD Primary Cardiologist:  Reatha Harps, MD  Chief Complaint    Hyperlipidemia - familial  Significant Past Medical History                     Allergies  Allergen Reactions   Poison Ivy Extract Itching    History of Present Illness    Ronald Franklin is a 71 y.o. male patient of Dr Flora Lipps, in the office today to discuss options for cholesterol management.  Insurance Carrier:   LDL Cholesterol goal:    Current Medications:     Previously tried:    Family Hx:     Social Hx: Tobacco: Alcohol:      Diet:      Exercise:   Adherence Assessment  Do you ever forget to take your medication? [] Yes [] No  Do you ever skip doses due to side effects? [] Yes [] No  Do you have trouble affording your medicines? [] Yes [] No  Are you ever unable to pick up your medication due to transportation difficulties? [] Yes [] No  Do you ever stop taking your medications because you don't believe they are helping? [] Yes [] No  Do you check your weight daily? [] Yes [] No   Adherence strategy: ***  Barriers to obtaining medications: ***     Accessory Clinical Findings   Lab Results  Component Value Date   CHOL 266 (H) 12/07/2022   HDL 54.60 12/07/2022   LDLCALC 197 (H) 12/07/2022   LDLDIRECT 206.6 08/04/2013   TRIG 71.0 12/07/2022   CHOLHDL 5 12/07/2022    No results found for: "LIPOA"  Lab Results  Component Value Date   ALT 27 12/07/2022   AST 21 12/07/2022   ALKPHOS 62 12/07/2022   BILITOT 0.4 12/07/2022   Lab Results  Component Value Date   CREATININE 1.10 02/22/2023   BUN 21 12/07/2022   NA 136 12/07/2022   K 3.9 12/07/2022   CL 100 12/07/2022   CO2 27 12/07/2022   Lab Results  Component Value Date   HGBA1C 5.8 11/06/2020    Home Medications    Current Outpatient Medications  Medication Sig Dispense Refill   Ascorbic Acid  (VITAMIN C) 1000 MG tablet Take 2,000 mg by mouth daily.     Cholecalciferol (VITAMIN D-3) 125 MCG (5000 UT) TABS Take 5,000 Units by mouth daily.     meloxicam (MOBIC) 7.5 MG tablet Take 7.5 mg by mouth daily.     Menaquinone-7 (VITAMIN K2 PO) Take 1 tablet by mouth daily.     Misc Natural Products (OSTEO BI-FLEX TRIPLE STRENGTH PO) Take 1 tablet by mouth daily.     Multiple Vitamin (MULTIVITAMIN WITH MINERALS) TABS tablet Take 1 tablet by mouth daily. One-A-Day Men's Health     olmesartan (BENICAR) 20 MG tablet Take 1 tablet (20 mg total) by mouth daily. 90 tablet 1   rosuvastatin (CRESTOR) 20 MG tablet Take 1 tablet (20 mg total) by mouth daily. (Patient not taking: Reported on 02/11/2023) 90 tablet 1   Turmeric 500 MG CAPS Take 500 mg by mouth daily.      vitamin E 400 UNIT capsule Take 800 Units by mouth daily.     VOLTAREN 1 % GEL Apply topically 4 (four) times daily.     Zinc 50 MG TABS Take 50 mg by mouth daily.     No current facility-administered  medications for this visit.     Assessment & Plan    No problem-specific Assessment & Plan notes found for this encounter.   Phillips Hay, PharmD [facility] [address]  [place], [place] [address] [phone]  04/09/2023, 8:34 AM

## 2023-04-09 NOTE — Telephone Encounter (Signed)
Patient was in office to see Dr. Flora Lipps  - had scheduled appt for lipids with CVRR at same time.    Cancelled appointment, and just talked with patient briefly about his options.  He has tried both rosuvastatin and atorvastatin, and not tolerated either.  He likes to do his own research on medications before making decisions.   I briefly explained mechanism of action, side effects, pricing and lipid lowering ability of both PCSK9 inhibitors and inclisiran.   He will review information and reach out to me via My Chart when he makes a decision on which medication he would prefer.

## 2023-04-09 NOTE — Patient Instructions (Signed)
Medication Instructions:  Continue same medications *If you need a refill on your cardiac medications before your next appointment, please call your pharmacy*   Lab Work: None ordered   Testing/Procedures: None ordered    Follow-Up: At [facility], you and your health needs are our priority.  As part of our continuing mission to provide you with exceptional heart care, we have created designated Provider Care Teams.  These Care Teams include your primary Cardiologist (physician) and Advanced Practice Providers (APPs -  Physician Assistants and Nurse Practitioners) who all work together to provide you with the care you need, when you need it.  We recommend signing up for the patient portal called "MyChart".  Sign up information is provided on this After Visit Summary.  MyChart is used to connect with patients for Virtual Visits (Telemedicine).  Patients are able to view lab/test results, encounter notes, upcoming appointments, etc.  Non-urgent messages can be sent to your provider as well.   To learn more about what you can do with MyChart, go to [email or web address].    Your next appointment:  1 year    Call in May to schedule August appointment     Provider:  Dr.O'Neal's PA

## 2023-06-09 ENCOUNTER — Other Ambulatory Visit: Payer: Self-pay | Admitting: Internal Medicine

## 2023-06-09 DIAGNOSIS — I10 Essential (primary) hypertension: Secondary | ICD-10-CM

## 2023-06-10 ENCOUNTER — Encounter: Payer: Self-pay | Admitting: Internal Medicine

## 2023-06-17 ENCOUNTER — Ambulatory Visit: Payer: Medicare HMO | Admitting: Internal Medicine

## 2023-06-17 VITALS — BP 124/68 | HR 61 | Temp 98.3°F | Ht 72.0 in | Wt 207.6 lb

## 2023-06-17 DIAGNOSIS — I10 Essential (primary) hypertension: Secondary | ICD-10-CM

## 2023-06-17 DIAGNOSIS — E785 Hyperlipidemia, unspecified: Secondary | ICD-10-CM | POA: Diagnosis not present

## 2023-06-17 LAB — CBC WITH DIFFERENTIAL/PLATELET
Basophils Absolute: 0.1 10*3/uL (ref 0.0–0.1)
Basophils Relative: 0.8 % (ref 0.0–3.0)
Eosinophils Absolute: 0.2 10*3/uL (ref 0.0–0.7)
Eosinophils Relative: 2.9 % (ref 0.0–5.0)
HCT: 42.3 % (ref 39.0–52.0)
Hemoglobin: 13.8 g/dL (ref 13.0–17.0)
Lymphocytes Relative: 42.6 % (ref 12.0–46.0)
Lymphs Abs: 3.2 10*3/uL (ref 0.7–4.0)
MCHC: 32.5 g/dL (ref 30.0–36.0)
MCV: 95.3 fL (ref 78.0–100.0)
Monocytes Absolute: 0.7 10*3/uL (ref 0.1–1.0)
Monocytes Relative: 10 % (ref 3.0–12.0)
Neutro Abs: 3.3 10*3/uL (ref 1.4–7.7)
Neutrophils Relative %: 43.7 % (ref 43.0–77.0)
Platelets: 189 10*3/uL (ref 150.0–400.0)
RBC: 4.44 Mil/uL (ref 4.22–5.81)
RDW: 13.9 % (ref 11.5–15.5)
WBC: 7.4 10*3/uL (ref 4.0–10.5)

## 2023-06-17 LAB — BASIC METABOLIC PANEL
BUN: 20 mg/dL (ref 6–23)
CO2: 29 meq/L (ref 19–32)
Calcium: 9.9 mg/dL (ref 8.4–10.5)
Chloride: 103 meq/L (ref 96–112)
Creatinine, Ser: 0.96 mg/dL (ref 0.40–1.50)
GFR: 78.41 mL/min (ref >60.00)
Glucose, Bld: 111 mg/dL — ABNORMAL HIGH (ref 70–99)
Potassium: 4.5 meq/L (ref 3.5–5.1)
Sodium: 139 meq/L (ref 135–145)

## 2023-06-17 NOTE — Patient Instructions (Signed)
Hypertension, Adult High blood pressure (hypertension) is when the force of blood pumping through the arteries is too strong. The arteries are the blood vessels that carry blood from the heart throughout the body. Hypertension forces the heart to work harder to pump blood and may cause arteries to become narrow or stiff. Untreated or uncontrolled hypertension can lead to a heart attack, heart failure, a stroke, kidney disease, and other problems. A blood pressure reading consists of a higher number over a lower number. Ideally, your blood pressure should be below 120/80. The first ("top") number is called the systolic pressure. It is a measure of the pressure in your arteries as your heart beats. The second ("bottom") number is called the diastolic pressure. It is a measure of the pressure in your arteries as the heart relaxes. What are the causes? The exact cause of this condition is not known. There are some conditions that result in high blood pressure. What increases the risk? Certain factors may make you more likely to develop high blood pressure. Some of these risk factors are under your control, including: Smoking. Not getting enough exercise or physical activity. Being overweight. Having too much fat, sugar, calories, or salt (sodium) in your diet. Drinking too much alcohol. Other risk factors include: Having a personal history of heart disease, diabetes, high cholesterol, or kidney disease. Stress. Having a family history of high blood pressure and high cholesterol. Having obstructive sleep apnea. Age. The risk increases with age. What are the signs or symptoms? High blood pressure may not cause symptoms. Very high blood pressure (hypertensive crisis) may cause: Headache. Fast or irregular heartbeats (palpitations). Shortness of breath. Nosebleed. Nausea and vomiting. Vision changes. Severe chest pain, dizziness, and seizures. How is this diagnosed? This condition is diagnosed by  measuring your blood pressure while you are seated, with your arm resting on a flat surface, your legs uncrossed, and your feet flat on the floor. The cuff of the blood pressure monitor will be placed directly against the skin of your upper arm at the level of your heart. Blood pressure should be measured at least twice using the same arm. Certain conditions can cause a difference in blood pressure between your right and left arms. If you have a high blood pressure reading during one visit or you have normal blood pressure with other risk factors, you may be asked to: Return on a different day to have your blood pressure checked again. Monitor your blood pressure at home for 1 week or longer. If you are diagnosed with hypertension, you may have other blood or imaging tests to help your health care provider understand your overall risk for other conditions. How is this treated? This condition is treated by making healthy lifestyle changes, such as eating healthy foods, exercising more, and reducing your alcohol intake. You may be referred for counseling on a healthy diet and physical activity. Your health care provider may prescribe medicine if lifestyle changes are not enough to get your blood pressure under control and if: Your systolic blood pressure is above 130. Your diastolic blood pressure is above 80. Your personal target blood pressure may vary depending on your medical conditions, your age, and other factors. Follow these instructions at home: Eating and drinking  Eat a diet that is high in fiber and potassium, and low in sodium, added sugar, and fat. An example of this eating plan is called the DASH diet. DASH stands for Dietary Approaches to Stop Hypertension. To eat this way: Eat   plenty of fresh fruits and vegetables. Try to fill one half of your plate at each meal with fruits and vegetables. Eat whole grains, such as whole-wheat pasta, brown rice, or whole-grain bread. Fill about one  fourth of your plate with whole grains. Eat or drink low-fat dairy products, such as skim milk or low-fat yogurt. Avoid fatty cuts of meat, processed or cured meats, and poultry with skin. Fill about one fourth of your plate with lean proteins, such as fish, chicken without skin, beans, eggs, or tofu. Avoid pre-made and processed foods. These tend to be higher in sodium, added sugar, and fat. Reduce your daily sodium intake. Many people with hypertension should eat less than 1,500 mg of sodium a day. Do not drink alcohol if: Your health care provider tells you not to drink. You are pregnant, may be pregnant, or are planning to become pregnant. If you drink alcohol: Limit how much you have to: 0-1 drink a day for women. 0-2 drinks a day for men. Know how much alcohol is in your drink. In the [place] one drink equals one 12 oz bottle of beer (355 mL), one 5 oz glass of wine (148 mL), or one 1 oz glass of hard liquor (44 mL). Lifestyle  Work with your health care provider to maintain a healthy body weight or to lose weight. Ask what an ideal weight is for you. Get at least 30 minutes of exercise that causes your heart to beat faster (aerobic exercise) most days of the week. Activities may include walking, swimming, or biking. Include exercise to strengthen your muscles (resistance exercise), such as Pilates or lifting weights, as part of your weekly exercise routine. Try to do these types of exercises for 30 minutes at least 3 days a week. Do not use any products that contain nicotine or tobacco. These products include cigarettes, chewing tobacco, and vaping devices, such as e-cigarettes. If you need help quitting, ask your health care provider. Monitor your blood pressure at home as told by your health care provider. Keep all follow-up visits. This is important. Medicines Take over-the-counter and prescription medicines only as told by your health care provider. Follow directions carefully. Blood  pressure medicines must be taken as prescribed. Do not skip doses of blood pressure medicine. Doing this puts you at risk for problems and can make the medicine less effective. Ask your health care provider about side effects or reactions to medicines that you should watch for. Contact a health care provider if you: Think you are having a reaction to a medicine you are taking. Have headaches that keep coming back (recurring). Feel dizzy. Have swelling in your ankles. Have trouble with your vision. Get help right away if you: Develop a severe headache or confusion. Have unusual weakness or numbness. Feel faint. Have severe pain in your chest or abdomen. Vomit repeatedly. Have trouble breathing. These symptoms may be an emergency. Get help right away. Call 911. Do not wait to see if the symptoms will go away. Do not drive yourself to the hospital. Summary Hypertension is when the force of blood pumping through your arteries is too strong. If this condition is not controlled, it may put you at risk for serious complications. Your personal target blood pressure may vary depending on your medical conditions, your age, and other factors. For most people, a normal blood pressure is less than 120/80. Hypertension is treated with lifestyle changes, medicines, or a combination of both. Lifestyle changes include losing weight, eating a healthy,   low-sodium diet, exercising more, and limiting alcohol. This information is not intended to replace advice given to you by your health care provider. Make sure you discuss any questions you have with your health care provider. Document Revised: 06/10/2021 Document Reviewed: 06/10/2021 Elsevier Patient Education  2024 [employer]  

## 2023-06-17 NOTE — Progress Notes (Signed)
Subjective:  Patient ID: Ronald Franklin, male    DOB: 08-25-1950  Age: 72 y.o. MRN: [id number]  CC: Hypertension and Hyperlipidemia   HPI Ronald Franklin presents for f/up ----  Discussed the use of AI scribe software for clinical note transcription with the patient, who gave verbal consent to proceed.  History of Present Illness   The patient is very active and denies chest pain, shortness of breath, dizziness, or lightheadedness. He has been actively engaged in physical work, including chain sawing and hauling brush. He denies any symptoms of heart conditions and reports a recent coronary calcium screening with a cardiologist, which was unremarkable.  The patient's blood pressure has been noted to be low, but he denies any symptoms of hypotension such as weakness or dizziness. He attributes any stumbling or unsteadiness to fatigue from physical work. He has not been monitoring his blood pressure at home but reports that his cardio device has not picked up any abnormalities.       Outpatient Medications Prior to Visit  Medication Sig Dispense Refill   Ascorbic Acid (VITAMIN C) 1000 MG tablet Take 2,000 mg by mouth daily.     ASPIRIN 81 PO Take 81 mg by mouth daily.     Cholecalciferol (VITAMIN D-3) 125 MCG (5000 UT) TABS Take 5,000 Units by mouth daily.     Menaquinone-7 (VITAMIN K2 PO) Take 1 tablet by mouth daily.     Misc Natural Products (OSTEO BI-FLEX TRIPLE STRENGTH PO) Take 1 tablet by mouth daily.     Multiple Vitamin (MULTIVITAMIN WITH MINERALS) TABS tablet Take 1 tablet by mouth daily. One-A-Day Men's Health     Turmeric 500 MG CAPS Take 500 mg by mouth daily.      vitamin E 400 UNIT capsule Take 800 Units by mouth daily.     VOLTAREN 1 % GEL Apply topically 4 (four) times daily.     Zinc 50 MG TABS Take 50 mg by mouth daily.     olmesartan (BENICAR) 20 MG tablet Take 1 tablet (20 mg total) by mouth daily. 90 tablet 1   meloxicam (MOBIC) 7.5 MG tablet Take 7.5 mg by mouth  daily.     rosuvastatin (CRESTOR) 20 MG tablet Take 1 tablet (20 mg total) by mouth daily. (Patient not taking: Reported on 04/09/2023) 90 tablet 1   No facility-administered medications prior to visit.    ROS Review of Systems  Constitutional: Negative.  Negative for diaphoresis and fatigue.  HENT: Negative.    Eyes: Negative.   Respiratory: Negative.  Negative for cough, chest tightness, shortness of breath and wheezing.   Cardiovascular:  Negative for chest pain, palpitations and leg swelling.  Gastrointestinal:  Negative for abdominal pain, constipation, diarrhea and vomiting.  Endocrine: Negative.   Genitourinary: Negative.  Negative for difficulty urinating.  Musculoskeletal:  Positive for arthralgias. Negative for myalgias.  Skin:  Negative for color change and pallor.  Neurological: Negative.  Negative for dizziness and weakness.  Hematological:  Negative for adenopathy. Does not bruise/bleed easily.  Psychiatric/Behavioral: Negative.      Objective:  BP 124/68 (BP Location: Left Arm, Patient Position: Sitting, Cuff Size: Normal)   Pulse 61   Temp 98.3 F (36.8 C) (Oral)   Ht 6' (1.829 m)   Wt 207 lb 9.6 oz (94.2 kg)   SpO2 96%   BMI 28.16 kg/m   BP Readings from Last 3 Encounters:  06/17/23 124/68  04/09/23 130/82  02/22/23 131/66    Wt  Readings from Last 3 Encounters:  06/17/23 207 lb 9.6 oz (94.2 kg)  04/09/23 202 lb 3.2 oz (91.7 kg)  02/11/23 209 lb 3.2 oz (94.9 kg)    Physical Exam Vitals reviewed.  Constitutional:      Appearance: Normal appearance. He is not ill-appearing.  HENT:     Mouth/Throat:     Mouth: Mucous membranes are moist.  Eyes:     General: No scleral icterus.    Conjunctiva/sclera: Conjunctivae normal.  Cardiovascular:     Rate and Rhythm: Normal rate and regular rhythm.     Heart sounds: No murmur heard. Pulmonary:     Effort: Pulmonary effort is normal.     Breath sounds: No stridor. No wheezing, rhonchi or rales.   Abdominal:     General: Abdomen is flat.     Palpations: There is no mass.     Tenderness: There is no abdominal tenderness. There is no guarding.     Hernia: No hernia is present.  Musculoskeletal:        General: Normal range of motion.     Cervical back: Neck supple.     Right lower leg: No edema.     Left lower leg: No edema.  Lymphadenopathy:     Cervical: No cervical adenopathy.  Skin:    General: Skin is warm and dry.  Neurological:     General: No focal deficit present.     Mental Status: He is alert.  Psychiatric:        Mood and Affect: Mood normal.        Behavior: Behavior normal.     Lab Results  Component Value Date   WBC 7.4 06/17/2023   HGB 13.8 06/17/2023   HCT 42.3 06/17/2023   PLT 189.0 06/17/2023   GLUCOSE 111 (H) 06/17/2023   CHOL 266 (H) 12/07/2022   TRIG 71.0 12/07/2022   HDL 54.60 12/07/2022   LDLDIRECT 206.6 08/04/2013   LDLCALC 197 (H) 12/07/2022   ALT 27 12/07/2022   AST 21 12/07/2022   NA 139 06/17/2023   K 4.5 06/17/2023   CL 103 06/17/2023   CREATININE 0.96 06/17/2023   BUN 20 06/17/2023   CO2 29 06/17/2023   TSH 2.59 12/07/2022   PSA 1.91 12/07/2022   INR 1.0 07/02/2008   HGBA1C 5.8 11/06/2020    CT CORONARY MORPH W/CTA COR W/SCORE W/CA W/CM &/OR WO/CM  Addendum Date: 02/26/2023   ADDENDUM REPORT: 02/26/2023 23:03 EXAM: OVER-READ INTERPRETATION  CT CHEST The following report is an over-read performed by radiologist Dr. Elnoria Howard [facility] on 02/26/2023. This over-read does not include interpretation of cardiac or coronary anatomy or pathology. The cardiovascular interpretation by the cardiologist is attached. COMPARISON:  Cardiac coronary artery calcium score CT dated 02/08/2023. Abdomen and pelvis CT dated 03/27/2022. FINDINGS: No enlarged lymph nodes. Unremarkable distal esophagus. Previously demonstrated small posterior gastric diverticulum. Interval adjacent 1.3 cm oval area of low density in the posterior  stomach on image number 47/3. This may represent ingested fatty material. Minimal bilateral dependent atelectasis. Minimal bilateral linear scarring, without significant change. No lung nodules or pleural fluid. Thoracic spine degenerative changes. IMPRESSION: 1. No acute abnormality. 2. Stable small posterior gastric diverticulum. Electronically Signed   By: Beckie Salts M.D.   On: 02/26/2023 23:03   Result Date: 02/26/2023 CLINICAL DATA:  Chest pain EXAM: Cardiac/Coronary CTA TECHNIQUE: A non-contrast, gated CT scan was obtained with axial slices of 3 mm through the heart for calcium scoring. Calcium  scoring was performed using the Agatston method. A 120 kV prospective, gated, contrast cardiac scan was obtained. Gantry rotation speed was 250 msecs and collimation was 0.6 mm. Two sublingual nitroglycerin tablets (0.8 mg) were given. The 3D data set was reconstructed in 5% intervals of the 35-75% of the R-R cycle. Diastolic phases were analyzed on a dedicated workstation using MPR, MIP, and VRT modes. The patient received 95 cc of contrast. FINDINGS: Image quality: Excellent. Noise artifact is: Limited. Coronary Arteries:  Normal coronary origin.  Right dominance. Left main: The left main is a large caliber vessel with a normal take off from the left coronary cusp that bifurcates to form a left anterior descending artery and a left circumflex artery. There is minimal non-calcified plaque (<25%). Left anterior descending artery: The proximal LAD contains minimal calcified plaque (<25%). The mid LAD contains mild calcified plaque (25-49%). The distal LAD is patent. D1 contains mild calcified plaque (25-49%). D2 is patent. Left circumflex artery: The LCX is non-dominant. There is minimal mixed density plaque (<25%). The LCX gives off 1 patent obtuse marginal branch. Right coronary artery: The RCA is dominant with normal take off from the right coronary cusp. The ostial RCA contains moderate non-calcified plaque  (50-69%). The proximal RCA contains mild mixed density plaque (25-49%). The distal RCA contains minimal calcified plaque (<25%). The RCA terminates as a PDA and right posterolateral branch without evidence of plaque or stenosis. Right Atrium: Right atrial size is within normal limits. Right Ventricle: The right ventricular cavity is within normal limits. Left Atrium: Left atrial size is normal in size with no left atrial appendage filling defect. Small PFO. Left Ventricle: The ventricular cavity size is within normal limits. Pulmonary arteries: Normal in size. Pulmonary veins: Normal pulmonary venous drainage. Pericardium: Normal thickness without significant effusion or calcium present. Cardiac valves: The aortic valve is trileaflet without significant calcification. The mitral valve is normal without significant calcification. Aorta: Normal caliber without significant disease. Extra-cardiac findings: See attached radiology report for non-cardiac structures. IMPRESSION: 1. Coronary calcium score of 716. This was 75th percentile for age-, sex, and race-matched controls. 2. Total plaque volume 731 mm3 which is 54th percentile for age- and sex-matched controls (calcified plaque 128 mm3; non-calcified plaque 603 mm3). TPV is severe. 3. Normal coronary origin with right dominance. 4. Moderate non-calcified plaque (50-69%) in the ostial RCA. 5. Mild calcified plaque (25-49%) in the mid LAD. 6. Minimal plaque in the LCX (<25%). RECOMMENDATIONS: 1. Moderate stenosis. Consider symptom-guided anti-ischemic pharmacotherapy as well as risk factor modification per guideline directed care. Additional analysis with CT FFR will be submitted. Lennie Odor, MD Electronically Signed: By: Lennie Odor M.D. On: 02/22/2023 16:00   CT CORONARY FFR DATA PREP & FLUID ANALYSIS  Result Date: 02/22/2023 EXAM: CT FFR analysis was performed on the original cardiac CTA dataset. Diagrammatic representation of the CT FFR analysis is provided  in a separate PDF document in PACS. This dictation was created using the PDF document and an interactive 3D model of the results. The 3D model is not available in the EMR/PACS. INTERPRETATION: CT FFR provides simultaneous calculation of pressure and flow across the entire coronary tree. For clinical decision making, CT FFR values should be obtained 1-2 cm distal to the lower border of each stenosis measured. Coronary CTA-related artifacts may impair the diagnostic accuracy of the original cardiac CTA and FFR CT results. *Due to the fact that CT FFR represents a mathematically-derived analysis, it is recommended that the results be interpreted as  follows: 1. CT FFR >0.80: Low likelihood of hemodynamic significance. 2. CT FFR 0.76-0.80: Borderline likelihood of hemodynamic significance. 3. CT FFR =< 0.75: High likelihood of hemodynamic significance. *Coronary CT Angiography-derived Fractional Flow Reserve Testing in Patients with Stable Coronary Artery Disease: Recommendations on Interpretation and Reporting. Radiology: Cardiothoracic Imaging. 2019;1(5):e190050 FINDINGS: 1. Left Main: 0.98; low likelihood of hemodynamic significance. 2. LAD: 0.89; low likelihood of hemodynamic significance. 3. LCX: 0.80; low likelihood of hemodynamic significance. 4. RCA: 0.92; low likelihood of hemodynamic significance. IMPRESSION: 1.  Ostial RCA negative by CT FFR. 2.  No obstructive lesions identified. Lennie Odor, MD Electronically Signed   By: Lennie Odor M.D.   On: 02/22/2023 16:07    Assessment & Plan:   Essential hypertension- His BP is over-controlled. Will discontinue the ARB -     Basic metabolic panel; Future -     CBC with Differential/Platelet; Future  Hyperlipidemia with target LDL less than 160- He is not willing to take a statin.     Follow-up: Return in about 6 months (around 12/15/2023).  Sanda Linger, MD

## 2023-06-18 NOTE — Assessment & Plan Note (Signed)
Estimated Creatinine Clearance: 81.6 mL/min (by C-G formula based on SCr of 0.96 mg/dL).

## 2023-07-02 ENCOUNTER — Encounter: Payer: Self-pay | Admitting: Internal Medicine

## 2023-07-02 NOTE — Telephone Encounter (Signed)
 Care team updated and letter sent for eye exam notes.

## 2023-12-02 ENCOUNTER — Encounter: Payer: Self-pay | Admitting: Internal Medicine

## 2023-12-02 ENCOUNTER — Ambulatory Visit (INDEPENDENT_AMBULATORY_CARE_PROVIDER_SITE_OTHER): Admitting: Internal Medicine

## 2023-12-02 ENCOUNTER — Ambulatory Visit: Payer: Medicare HMO | Admitting: Internal Medicine

## 2023-12-02 VITALS — BP 138/86 | HR 90 | Temp 98.6°F | Resp 16 | Ht 72.0 in | Wt 205.0 lb

## 2023-12-02 DIAGNOSIS — I10 Essential (primary) hypertension: Secondary | ICD-10-CM | POA: Diagnosis not present

## 2023-12-02 DIAGNOSIS — Z1211 Encounter for screening for malignant neoplasm of colon: Secondary | ICD-10-CM

## 2023-12-02 DIAGNOSIS — E785 Hyperlipidemia, unspecified: Secondary | ICD-10-CM | POA: Diagnosis not present

## 2023-12-02 DIAGNOSIS — Z0000 Encounter for general adult medical examination without abnormal findings: Secondary | ICD-10-CM

## 2023-12-02 DIAGNOSIS — N400 Enlarged prostate without lower urinary tract symptoms: Secondary | ICD-10-CM

## 2023-12-02 DIAGNOSIS — Z0001 Encounter for general adult medical examination with abnormal findings: Secondary | ICD-10-CM

## 2023-12-02 DIAGNOSIS — I4892 Unspecified atrial flutter: Secondary | ICD-10-CM

## 2023-12-02 LAB — CBC WITH DIFFERENTIAL/PLATELET
Basophils Absolute: 0 10*3/uL (ref 0.0–0.1)
Basophils Relative: 0.6 % (ref 0.0–3.0)
Eosinophils Absolute: 0.3 10*3/uL (ref 0.0–0.7)
Eosinophils Relative: 3.6 % (ref 0.0–5.0)
HCT: 44.8 % (ref 39.0–52.0)
Hemoglobin: 14.8 g/dL (ref 13.0–17.0)
Lymphocytes Relative: 30.4 % (ref 12.0–46.0)
Lymphs Abs: 2.6 10*3/uL (ref 0.7–4.0)
MCHC: 33.2 g/dL (ref 30.0–36.0)
MCV: 95.2 fl (ref 78.0–100.0)
Monocytes Absolute: 0.7 10*3/uL (ref 0.1–1.0)
Monocytes Relative: 8.1 % (ref 3.0–12.0)
Neutro Abs: 4.8 10*3/uL (ref 1.4–7.7)
Neutrophils Relative %: 57.3 % (ref 43.0–77.0)
Platelets: 184 10*3/uL (ref 150.0–400.0)
RBC: 4.7 Mil/uL (ref 4.22–5.81)
RDW: 14.6 % (ref 11.5–15.5)
WBC: 8.4 10*3/uL (ref 4.0–10.5)

## 2023-12-02 LAB — URINALYSIS, ROUTINE W REFLEX MICROSCOPIC
Bilirubin Urine: NEGATIVE
Hgb urine dipstick: NEGATIVE
Ketones, ur: NEGATIVE
Leukocytes,Ua: NEGATIVE
Nitrite: NEGATIVE
RBC / HPF: NONE SEEN (ref 0–?)
Specific Gravity, Urine: 1.025 (ref 1.000–1.030)
Total Protein, Urine: NEGATIVE
Urine Glucose: NEGATIVE
Urobilinogen, UA: 0.2 (ref 0.0–1.0)
pH: 6 (ref 5.0–8.0)

## 2023-12-02 LAB — TROPONIN I (HIGH SENSITIVITY): High Sens Troponin I: 6 ng/L (ref 2–17)

## 2023-12-02 LAB — TSH: TSH: 2.54 u[IU]/mL (ref 0.35–5.50)

## 2023-12-02 LAB — LIPID PANEL
Cholesterol: 209 mg/dL — ABNORMAL HIGH (ref 0–200)
HDL: 50.8 mg/dL (ref >39.00)
LDL Cholesterol: 136 mg/dL — ABNORMAL HIGH (ref 0–99)
NonHDL: 158.67
Total CHOL/HDL Ratio: 4
Triglycerides: 114 mg/dL (ref 0.0–149.0)
VLDL: 22.8 mg/dL (ref 0.0–40.0)

## 2023-12-02 LAB — BRAIN NATRIURETIC PEPTIDE: Pro B Natriuretic peptide (BNP): 15 pg/mL (ref 0.0–100.0)

## 2023-12-02 LAB — BASIC METABOLIC PANEL WITH GFR
BUN: 18 mg/dL (ref 6–23)
CO2: 31 meq/L (ref 19–32)
Calcium: 9.8 mg/dL (ref 8.4–10.5)
Chloride: 104 meq/L (ref 96–112)
Creatinine, Ser: 0.98 mg/dL (ref 0.40–1.50)
GFR: 76.25 mL/min (ref >60.00)
Glucose, Bld: 83 mg/dL (ref 70–99)
Potassium: 4.5 meq/L (ref 3.5–5.1)
Sodium: 140 meq/L (ref 135–145)

## 2023-12-02 LAB — HEPATIC FUNCTION PANEL
ALT: 21 U/L (ref 0–53)
AST: 19 U/L (ref 0–37)
Albumin: 4.7 g/dL (ref 3.5–5.2)
Alkaline Phosphatase: 67 U/L (ref 39–117)
Bilirubin, Direct: 0.1 mg/dL (ref 0.0–0.3)
Total Bilirubin: 0.6 mg/dL (ref 0.2–1.2)
Total Protein: 6.8 g/dL (ref 6.0–8.3)

## 2023-12-02 LAB — PSA: PSA: 1.84 ng/mL (ref 0.10–4.00)

## 2023-12-02 MED ORDER — DILTIAZEM HCL ER COATED BEADS 120 MG PO CP24
120.0000 mg | ORAL_CAPSULE | Freq: Every day | ORAL | 0 refills | Status: AC
Start: 2023-12-02 — End: ?

## 2023-12-02 MED ORDER — APIXABAN 5 MG PO TABS
5.0000 mg | ORAL_TABLET | Freq: Two times a day (BID) | ORAL | 0 refills | Status: DC
Start: 2023-12-02 — End: 2024-03-07

## 2023-12-02 NOTE — Progress Notes (Signed)
 Subjective:  Patient ID: Ronald Franklin, male    DOB: 1951-06-02  Age: 73 y.o. MRN: [id number]  CC: Annual Exam, Hypertension, and Hyperlipidemia   HPI Ronald Franklin presents for a CPX and f/up ---\\  Discussed the use of AI scribe software for clinical note transcription with the patient, who gave verbal consent to proceed.  History of Present Illness   Ronald Franklin "Ronald Franklin" is a 73 year old male who presents for evaluation of low heart rate and cardiovascular health.  He has a history of bradycardia. He experiences no weakness, dizziness, or lightheadedness. 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  He has a history of arrhythmia that occurred during an episode of kidney stones, which resolved with treatment. He currently has no cardiovascular symptoms such as chest pain, shortness of breath, or cold, clammy sweats during physical activity.       Outpatient Medications Prior to Visit  Medication Sig Dispense Refill   Ascorbic Acid  (VITAMIN C ) 1000 MG tablet Take 2,000 mg by mouth daily.     Cholecalciferol (VITAMIN D-3) 125 MCG (5000 UT) TABS Take 5,000 Units by mouth daily.     Menaquinone-7 (VITAMIN K2 PO) Take 1 tablet by mouth daily.     Misc Natural Products (OSTEO BI-FLEX TRIPLE STRENGTH PO) Take 1 tablet by mouth daily.     Multiple Vitamin (MULTIVITAMIN WITH MINERALS) TABS tablet Take 1 tablet by mouth daily. One-A-Day Men's Health     Turmeric 500 MG CAPS Take 500 mg by mouth daily.      vitamin E 400 UNIT capsule Take 800 Units by mouth daily.     VOLTAREN 1 % GEL Apply topically 4 (four) times daily.     Zinc 50 MG TABS Take 50 mg by mouth daily.     ASPIRIN  81 PO Take 81 mg by mouth daily.     No facility-administered medications prior to visit.    ROS Review of Systems  Constitutional:  Negative for appetite change, chills,  diaphoresis, fatigue and fever.  HENT: Negative.    Eyes: Negative.   Respiratory: Negative.  Negative for cough, chest tightness, shortness of breath and wheezing.   Cardiovascular:  Negative for chest pain, palpitations and leg swelling.  Gastrointestinal: Negative.  Negative for abdominal pain, blood in stool, constipation, diarrhea, nausea and vomiting.  Genitourinary: Negative.  Negative for difficulty urinating.  Musculoskeletal:  Positive for arthralgias. Negative for myalgias.  Skin: Negative.  Negative for color change.  Neurological:  Negative for dizziness, weakness, light-headedness and numbness.  Hematological:  Negative for adenopathy. Does not bruise/bleed easily.  Psychiatric/Behavioral: Negative.      Objective:  BP 138/86 (BP Location: Left Arm, Patient Position: Sitting, Cuff Size: Normal)   Pulse 90   Temp 98.6 F (37 C) (Oral)   Resp 16   Ht 6' (1.829 m)   Wt 205 lb (93 kg)   SpO2 96%   BMI 27.80 kg/m   BP Readings from Last 3 Encounters:  12/02/23 138/86  06/17/23 124/68  04/09/23 130/82    Wt Readings from Last 3 Encounters:  12/02/23 205 lb (93 kg)  06/17/23 207 lb 9.6 oz (94.2 kg)  04/09/23 202 lb 3.2 oz (91.7 kg)    Physical Exam Vitals reviewed. Exam conducted with a chaperone present.  Constitutional:      Appearance: Normal  appearance.  HENT:     Nose: Nose normal.     Mouth/Throat:     Mouth: Mucous membranes are moist.  Eyes:     General: No scleral icterus.    Conjunctiva/sclera: Conjunctivae normal.  Cardiovascular:     Rate and Rhythm: Normal rate. Rhythm regularly irregular.     Heart sounds: No murmur heard.    No friction rub. No gallop.     Comments: EKG-- A flutter with 3:1 AV conduction, 92 bpm LAD ST and T wave changes in inferior/anterior/lateral leads Prolonged QT New changes Pulmonary:     Effort: Pulmonary effort is normal.     Breath sounds: No stridor. No wheezing, rhonchi or rales.  Abdominal:     General:  Abdomen is flat.     Palpations: There is no mass.     Tenderness: There is no abdominal tenderness. There is no guarding.     Hernia: No hernia is present. There is no hernia in the left inguinal area or right inguinal area.  Genitourinary:    Pubic Area: No rash.      Penis: Normal.      Testes: Normal.     Epididymis:     Right: Normal.     Left: Normal.     Prostate: Enlarged. Not tender and no nodules present.     Rectum: Normal. Guaiac result negative. No mass, tenderness, anal fissure, external hemorrhoid or internal hemorrhoid. Normal anal tone.  Musculoskeletal:        General: Normal range of motion.     Cervical back: Neck supple.     Right lower leg: No edema.     Left lower leg: No edema.  Lymphadenopathy:     Cervical: No cervical adenopathy.     Lower Body: No right inguinal adenopathy. No left inguinal adenopathy.  Skin:    General: Skin is warm and dry.     Coloration: Skin is not pale.  Neurological:     General: No focal deficit present.     Mental Status: He is alert and oriented to person, place, and time. Mental status is at baseline.  Psychiatric:        Mood and Affect: Mood normal.        Behavior: Behavior normal.        Thought Content: Thought content normal.        Judgment: Judgment normal.     Lab Results  Component Value Date   WBC 8.4 12/02/2023   HGB 14.8 12/02/2023   HCT 44.8 12/02/2023   PLT 184.0 12/02/2023   GLUCOSE 83 12/02/2023   CHOL 209 (H) 12/02/2023   TRIG 114.0 12/02/2023   HDL 50.80 12/02/2023   LDLDIRECT 206.6 08/04/2013   LDLCALC 136 (H) 12/02/2023   ALT 21 12/02/2023   AST 19 12/02/2023   NA 140 12/02/2023   K 4.5 12/02/2023   CL 104 12/02/2023   CREATININE 0.98 12/02/2023   BUN 18 12/02/2023   CO2 31 12/02/2023   TSH 2.54 12/02/2023   PSA 1.84 12/02/2023   INR 1.0 07/02/2008   HGBA1C 5.8 11/06/2020    CT CORONARY MORPH W/CTA COR W/SCORE W/CA W/CM &/OR WO/CM Addendum Date: 02/26/2023 ADDENDUM REPORT:  02/26/2023 23:03 EXAM: OVER-READ INTERPRETATION  CT CHEST The following report is an over-read performed by radiologist Dr. Flint Hummer [facility] on 02/26/2023. This over-read does not include interpretation of cardiac or coronary anatomy or pathology. The cardiovascular interpretation by the cardiologist is attached.  COMPARISON:  Cardiac coronary artery calcium  score CT dated 02/08/2023. Abdomen and pelvis CT dated 03/27/2022. FINDINGS: No enlarged lymph nodes. Unremarkable distal esophagus. Previously demonstrated small posterior gastric diverticulum. Interval adjacent 1.3 cm oval area of low density in the posterior stomach on image number 47/3. This may represent ingested fatty material. Minimal bilateral dependent atelectasis. Minimal bilateral linear scarring, without significant change. No lung nodules or pleural fluid. Thoracic spine degenerative changes. IMPRESSION: 1. No acute abnormality. 2. Stable small posterior gastric diverticulum. Electronically Signed   By: Catherin Closs M.D.   On: 02/26/2023 23:03   Result Date: 02/26/2023 CLINICAL DATA:  Chest pain EXAM: Cardiac/Coronary CTA TECHNIQUE: A non-contrast, gated CT scan was obtained with axial slices of 3 mm through the heart for calcium  scoring. Calcium  scoring was performed using the Agatston method. A 120 kV prospective, gated, contrast cardiac scan was obtained. Gantry rotation speed was 250 msecs and collimation was 0.6 mm. Two sublingual nitroglycerin  tablets (0.8 mg) were given. The 3D data set was reconstructed in 5% intervals of the 35-75% of the R-R cycle. Diastolic phases were analyzed on a dedicated workstation using MPR, MIP, and VRT modes. The patient received 95 cc of contrast. FINDINGS: Image quality: Excellent. Noise artifact is: Limited. Coronary Arteries:  Normal coronary origin.  Right dominance. Left main: The left main is a large caliber vessel with a normal take off from the left coronary cusp that bifurcates to  form a left anterior descending artery and a left circumflex artery. There is minimal non-calcified plaque (<25%). Left anterior descending artery: The proximal LAD contains minimal calcified plaque (<25%). The mid LAD contains mild calcified plaque (25-49%). The distal LAD is patent. D1 contains mild calcified plaque (25-49%). D2 is patent. Left circumflex artery: The LCX is non-dominant. There is minimal mixed density plaque (<25%). The LCX gives off 1 patent obtuse marginal branch. Right coronary artery: The RCA is dominant with normal take off from the right coronary cusp. The ostial RCA contains moderate non-calcified plaque (50-69%). The proximal RCA contains mild mixed density plaque (25-49%). The distal RCA contains minimal calcified plaque (<25%). The RCA terminates as a PDA and right posterolateral branch without evidence of plaque or stenosis. Right Atrium: Right atrial size is within normal limits. Right Ventricle: The right ventricular cavity is within normal limits. Left Atrium: Left atrial size is normal in size with no left atrial appendage filling defect. Small PFO. Left Ventricle: The ventricular cavity size is within normal limits. Pulmonary arteries: Normal in size. Pulmonary veins: Normal pulmonary venous drainage. Pericardium: Normal thickness without significant effusion or calcium  present. Cardiac valves: The aortic valve is trileaflet without significant calcification. The mitral valve is normal without significant calcification. Aorta: Normal caliber without significant disease. Extra-cardiac findings: See attached radiology report for non-cardiac structures. IMPRESSION: 1. Coronary calcium  score of 716. This was 75th percentile for age-, sex, and race-matched controls. 2. Total plaque volume 731 mm3 which is 54th percentile for age- and sex-matched controls (calcified plaque 128 mm3; non-calcified plaque 603 mm3). TPV is severe. 3. Normal coronary origin with right dominance. 4. Moderate  non-calcified plaque (50-69%) in the ostial RCA. 5. Mild calcified plaque (25-49%) in the mid LAD. 6. Minimal plaque in the LCX (<25%). RECOMMENDATIONS: 1. Moderate stenosis. Consider symptom-guided anti-ischemic pharmacotherapy as well as risk factor modification per guideline directed care. Additional analysis with CT FFR will be submitted. Jackquelyn Mass, MD Electronically Signed: By: Jackquelyn Mass M.D. On: 02/22/2023 16:00   CT CORONARY FFR DATA PREP &  FLUID ANALYSIS Result Date: 02/22/2023 EXAM: CT FFR analysis was performed on the original cardiac CTA dataset. Diagrammatic representation of the CT FFR analysis is provided in a separate PDF document in PACS. This dictation was created using the PDF document and an interactive 3D model of the results. The 3D model is not available in the EMR/PACS. INTERPRETATION: CT FFR provides simultaneous calculation of pressure and flow across the entire coronary tree. For clinical decision making, CT FFR values should be obtained 1-2 cm distal to the lower border of each stenosis measured. Coronary CTA-related artifacts may impair the diagnostic accuracy of the original cardiac CTA and FFR CT results. *Due to the fact that CT FFR represents a mathematically-derived analysis, it is recommended that the results be interpreted as follows: 1. CT FFR >0.80: Low likelihood of hemodynamic significance. 2. CT FFR 0.76-0.80: Borderline likelihood of hemodynamic significance. 3. CT FFR =< 0.75: High likelihood of hemodynamic significance. *Coronary CT Angiography-derived Fractional Flow Reserve Testing in Patients with Stable Coronary Artery Disease: Recommendations on Interpretation and Reporting. Radiology: Cardiothoracic Imaging. 2019;1(5):e190050 FINDINGS: 1. Left Main: 0.98; low likelihood of hemodynamic significance. 2. LAD: 0.89; low likelihood of hemodynamic significance. 3. LCX: 0.80; low likelihood of hemodynamic significance. 4. RCA: 0.92; low likelihood of hemodynamic  significance. IMPRESSION: 1.  Ostial RCA negative by CT FFR. 2.  No obstructive lesions identified. Jackquelyn Mass, MD Electronically Signed   By: Jackquelyn Mass M.D.   On: 02/22/2023 16:07    Assessment & Plan:  Hypertension, unspecified type -     EKG 12-Lead -     CBC with Differential/Platelet; Future -     Basic metabolic panel with GFR; Future -     TSH; Future -     Urinalysis, Routine w reflex microscopic; Future  Hyperlipidemia with target LDL less than 160- He is not willing to take a statin. -     Lipid panel; Future -     TSH; Future -     Hepatic function panel; Future  Encounter for general adult medical examination with abnormal findings- Exam completed, labs reviewed, vaccines reviewed, cancer screenings addressed, pt ed material was given.   Benign prostatic hyperplasia without lower urinary tract symptoms -     PSA; Future  Essential hypertension -     dilTIAZem  HCl ER Coated Beads; Take 1 capsule (120 mg total) by mouth daily.  Dispense: 90 capsule; Refill: 0  Screening for colon cancer -     Cologuard  Atrial flutter by electrocardiogram (HCC) -     Brain natriuretic peptide; Future -     Troponin I (High Sensitivity); Future -     D-dimer, quantitative; Future -     Ambulatory referral to Cardiology -     dilTIAZem  HCl ER Coated Beads; Take 1 capsule (120 mg total) by mouth daily.  Dispense: 90 capsule; Refill: 0 -     Apixaban ; Take 1 tablet (5 mg total) by mouth 2 (two) times daily.  Dispense: 180 tablet; Refill: 0     Follow-up: Return in about 3 months (around 03/02/2024).  Sandra Crouch, MD

## 2023-12-02 NOTE — Patient Instructions (Signed)

## 2023-12-03 LAB — D-DIMER, QUANTITATIVE: D-Dimer, Quant: 0.44 ug{FEU}/mL (ref <0.50)

## 2023-12-05 ENCOUNTER — Encounter: Payer: Self-pay | Admitting: Internal Medicine

## 2023-12-20 LAB — COLOGUARD: COLOGUARD: NEGATIVE

## 2023-12-24 NOTE — Progress Notes (Unsigned)
 Cardiology Clinic Note   Patient Name: Ronald Franklin Date of Encounter: 12/24/2023  Primary Care Provider:  Arcadio Knuckles, MD Primary Cardiologist:  Oneil Bigness, MD  Patient Profile    Ronald Franklin 73 year old male presents to the clinic today for follow-up evaluation of his atrial flutter and hyperlipidemia.  Past Medical History    Past Medical History:  Diagnosis Date   Allergic rhinitis    Arthritis    CTS (carpal tunnel syndrome)    Hypertension    OSA (obstructive sleep apnea)    Type II diabetes mellitus with manifestations (HCC) 09/20/2020   Past Surgical History:  Procedure Laterality Date   BACK SURGERY     C4-5-6   2 cervical    from AA   CARDIOVERSION N/A 09/20/2020   Procedure: CARDIOVERSION;  Surgeon: Luana Rumple, MD;  Location: [facility];  Service: Cardiovascular;  Laterality: N/A;   CARPAL TUNNEL RELEASE  09/16/2011   Procedure: CARPAL TUNNEL RELEASE;  Surgeon: Adelbert Adler, MD;  Location: [facility];  Service: Neurosurgery;  Laterality: Left;  Left Carpal Tunnel Release   CERVICAL LAMINECTOMY     EXTRACORPOREAL SHOCK WAVE LITHOTRIPSY Left 09/16/2020   Procedure: EXTRACORPOREAL SHOCK WAVE LITHOTRIPSY (ESWL);  Surgeon: Florencio Hunting, MD;  Location: [facility];  Service: Urology;  Laterality: Left;   KNEE ARTHROSCOPY WITH MEDIAL MENISECTOMY  07/06/2012   Procedure: KNEE ARTHROSCOPY WITH MEDIAL MENISECTOMY;  Surgeon: Verlinda Gloss, MD;  Location: [facility];  Service: Orthopedics;  Laterality: Left;  LEFT KNEE ARTHROSCOPY WITH PARTIAL MEDIAL MENISECTOMY   KNEE SURGERY     NECK SURGERY     ROTATOR CUFF REPAIR     bilateral    TEE WITHOUT CARDIOVERSION N/A 09/20/2020   Procedure: TRANSESOPHAGEAL ECHOCARDIOGRAM (TEE);  Surgeon: Luana Rumple, MD;  Location[facility];  Service: Cardiovascular;  Laterality: N/A;   TOTAL HIP ARTHROPLASTY Left 01/19/2019   Procedure: TOTAL HIP ARTHROPLASTY ANTERIOR APPROACH;  Surgeon: Adonica Hoose, MD;  Location: [facility];  Service: Orthopedics;  Laterality: Left;    Allergies  Allergies  Allergen Reactions   Poison Ivy Extract Itching    History of Present Illness    Ronald Franklin has a PMH of atrial flutter status post TEE with DCCV, hyperlipidemia, diabetes, and carpal tunnel syndrome.  His PMH also includes history of kidney stones.  He was seen in follow-up by Dr. Rolm Clos on 10/08/2020.  He underwent TEE/DCCV on 09/20/2020.  His EKG on follow-up showed sinus rhythm.  His diltiazem  was stopped.  He was not noted to have recurrence of atrial flutter.  He denied palpitations.  He was encouraged to continue apixaban  for another few weeks.  He denied bleeding issues.  Long-term anticoagulation was not recommended due to his diabetes.  He continue to work on his diet and hope to no longer be diabetic.  He was working with his PCP who was monitoring his blood glucose.  They discussed the need to monitor for atrial fibrillation.  He denied chest pain, shortness of breath, palpitations and felt he was overall doing well.  He did not require surgery for kidney stones and was able to pass on his own.  He presented to the clinic 02/11/23 for follow-up evaluation and stated he had not had any further episodes of atrial fibrillation/flutter.  We reviewed his coronary calcium  scoring and recent CT.  His PCP recommended starting statin therapy.  He was reluctant to do this.  We discussed  the benefits of therapy.  He is worried about side effects.  We discussed pharmacy lipid clinic and I will refer.  We also discussed PCSK9 inhibitors.  I refered to pharmacy lipid clinic for further management and evaluation of cholesterol.  I planned fasting lipids and LFTs in 8 weeks.  He was seen in follow-up by Dr. Rolm Clos on 04/09/2023.  His blood pressure was well-controlled.  He had no recurrence of atrial fibrillation.  He was no longer on apixaban .  His diabetes was controlled.  He desired a more holistic  approach.  His EKG was unchanged.  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  He was meeting with pharmacy lipid clinic that day to discuss nonstatin options.  His aspirin  was continued.  Follow-up was planned for 1 year.  He was seen by pharmacy lipid clinic 04/09/2023.  PCSK9 inhibitor was reviewed.  He was to do his own research on the medications.  He presents to the clinic today for follow-up evaluation and states***.  He presents to the clinic today for follow-up evaluation and states***.  Today he denies chest pain, shortness of breath, lower extremity edema, fatigue, palpitations, melena, hematuria, hemoptysis, diaphoresis, weakness, presyncope, syncope, orthopnea, and PND.     Home Medications    Prior to Admission medications   Medication Sig Start Date End Date Taking? Authorizing Provider  Ascorbic Acid  (VITAMIN C ) 1000 MG tablet Take 2,000 mg by mouth daily.    [provider]  Cholecalciferol (VITAMIN D-3) 125 MCG (5000 UT) TABS Take 5,000 Units by mouth daily.    [provider]  Menaquinone-7 (VITAMIN K2 PO) Take 1 tablet by mouth daily.    [provider]  Misc Natural Products (OSTEO BI-FLEX TRIPLE STRENGTH PO) Take 1 tablet by mouth daily.    [provider]  Multiple Vitamin (MULTIVITAMIN WITH MINERALS) TABS tablet Take 1 tablet by mouth daily. One-A-Day Men's Health    [provider]  olmesartan  (BENICAR ) 20 MG tablet Take 1 tablet (20 mg total) by mouth daily. 12/08/22   Arcadio Knuckles, MD  rosuvastatin  (CRESTOR ) 20 MG tablet Take 1 tablet (20 mg total) by mouth daily. 12/08/22   Arcadio Knuckles, MD  Turmeric 500 MG CAPS Take 500 mg by mouth daily.     [provider]  vitamin E 400 UNIT capsule Take 800 Units by mouth daily.    [provider]  Zinc 50 MG TABS Take 50 mg by mouth daily.    [provider]    Family History    Family History  Problem Relation Age of  Onset   Diabetes Brother    Diabetes Brother    Hyperlipidemia Other    Prostate cancer Other    Heart disease Neg Hx    Early death Neg Hx    Alcohol  abuse Neg Hx    Arthritis Neg Hx    Cancer Neg Hx    Stroke Neg Hx    Kidney disease Neg Hx    Hypertension Neg Hx    He indicated that his mother is deceased. He indicated that his father is deceased. He indicated that both of his sisters are alive. He indicated that all of his three brothers are alive. He indicated that the status of his neg hx is unknown.  Social History    Social History   Socioeconomic History   Marital status: Married    Spouse name: Not on file   Number of  children: 6   Years of education: BS   Highest education level: Bachelor's degree (e.g., BA, AB, BS)  Occupational History   Occupation: Retired Sport and exercise psychologist  Tobacco Use   Smoking status: Never   Smokeless tobacco: Never  Substance and Sexual Activity   Alcohol  use: Yes    Alcohol /week: 5.0 standard drinks of alcohol     Types: 5 Glasses of wine per week    Comment: socially   Drug use: No   Sexual activity: Yes    Birth control/protection: None  Other Topics Concern   Not on file  Social History Narrative   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 Married. Has children.   Drinks 1 cup of coffee a day    Social Drivers of Corporate investment banker Strain: Low Risk  (12/01/2023)   Overall Financial Resource Strain (CARDIA)    Difficulty of Paying Living Expenses: Not hard at all  Food Insecurity: No Food Insecurity (12/01/2023)   Hunger Vital Sign    Worried About Running Out of Food in the Last Year: Never true    Ran Out of Food in the Last Year: Never true  Transportation Needs: No Transportation Needs (12/01/2023)   PRAPARE - Administrator, Civil Service (Medical): No    Lack of Transportation (Non-Medical): No  Physical Activity: Sufficiently Active  (12/01/2023)   Exercise Vital Sign    Days of Exercise per Week: 5 days    Minutes of Exercise per Session: 90 min  Stress: No Stress Concern Present (12/01/2023)   [employer] of Occupational Health - Occupational Stress Questionnaire    Feeling of Stress : Not at all  Social Connections: Moderately Integrated (12/01/2023)   Social Connection and Isolation Panel [NHANES]    Frequency of Communication with Friends and Family: More than three times a week    Frequency of Social Gatherings with Friends and Family: More than three times a week    Attends Religious Services: 1 to 4 times per year    Active Member of [employer] or Organizations: No    Attends Banker Meetings: Not on file    Marital Status: Married  Catering manager Violence: Not At Risk (12/01/2021)   Humiliation, Afraid, Rape, and Kick questionnaire    Fear of Current or Ex-Partner: No    Emotionally Abused: No    Physically Abused: No    Sexually Abused: No     Review of Systems    General:  No chills, fever, night sweats or weight changes.  Cardiovascular:  No chest pain, dyspnea on exertion, edema, orthopnea, palpitations, paroxysmal nocturnal dyspnea. Dermatological: No rash, lesions/masses Respiratory: No cough, dyspnea Urologic: No hematuria, dysuria Abdominal:   No nausea, vomiting, diarrhea, bright red blood per rectum, melena, or hematemesis Neurologic:  No visual changes, wkns, changes in mental status. All other systems reviewed and are otherwise negative except as noted above.  Physical Exam    VS:  There were no vitals taken for this visit. , BMI There is no height or weight on file to calculate BMI. GEN: Well nourished, well developed, in no acute distress. HEENT: normal. Neck: Supple, no JVD, carotid bruits, or masses. Cardiac: RRR, no murmurs, rubs, or gallops. No clubbing, cyanosis, edema.  Radials/DP/PT 2+ and equal bilaterally.  Respiratory:  Respirations regular and unlabored, clear  to auscultation bilaterally. GI: Soft, nontender, nondistended, BS + x 4. MS: no  deformity or atrophy. Skin: warm and dry, no rash. Neuro:  Strength and sensation are intact. Psych: Normal affect.  Accessory Clinical Findings    Recent Labs: 12/02/2023: ALT 21; BUN 18; Creatinine, Ser 0.98; Hemoglobin 14.8; Platelets 184.0; Potassium 4.5; Pro B Natriuretic peptide (BNP) 15.0; Sodium 140; TSH 2.54   Recent Lipid Panel    Component Value Date/Time   CHOL 209 (H) 12/02/2023 1600   TRIG 114.0 12/02/2023 1600   HDL 50.80 12/02/2023 1600   CHOLHDL 4 12/02/2023 1600   VLDL 22.8 12/02/2023 1600   LDLCALC 136 (H) 12/02/2023 1600   LDLDIRECT 206.6 08/04/2013 1003    No BP recorded.  {Refresh Note OR Click here to enter BP  :1}***    ECG personally reviewed by me today-***  EKG 02/11/2023 Normal sinus rhythm low voltage criteria for LV H 60 bpm - No acute changes   TEE 09/20/2020  1. Left ventricular ejection fraction, by estimation, is 55 to 60%. The  left ventricle has normal function. The left ventricle has no regional  wall motion abnormalities. Left ventricular diastolic function could not  be evaluated.   2. Right ventricular systolic function is normal. The right ventricular  size is mildly enlarged. There is mildly elevated pulmonary artery  systolic pressure.   3. No left atrial/left atrial appendage thrombus was detected. The LAA  emptying velocity was 60 cm/s.   4. The mitral valve is normal in structure. Trivial mitral valve  regurgitation. No evidence of mitral stenosis.   5. Tricuspid valve regurgitation is mild to moderate.   6. The aortic valve is normal in structure. Aortic valve regurgitation is  not visualized. No aortic stenosis is present.   7. The inferior vena cava is normal in size with greater than 50%  respiratory variability, suggesting right atrial pressure of 3 mmHg.   Assessment & Plan   1.  Atrial flutter-heart rate today***.  No recent episodes  of accelerated or irregular heartbeat.  Previous cardiac event monitor 10/29/2020 showed brief ectopic atrial tachycardic episodes, rare ectopy and no atrial fibrillation or flutter.  He is post cardioversion 2022.  He is no longer on Eliquis .  Previously discussed not wanting to take the medication. Avoid triggers caffeine, chocolate, EtOH, dehydration etc. Continue to monitor Continue aspirin   Coronary artery disease-coronary calcium  scoring showed high plaque burden.  Previously recommended PCSK9 inhibitor.  He is statin intolerant.  He wishes to continue with lifestyle modification.  He previously met with pharmacy lipid clinic and has reviewed PCSK9 inhibitors.  Patient expressed understanding.  Encouraged starting statin therapy. Heart healthy low-sodium diet Maintain physical activity Continue aspirin   Essential hypertension-BP today 124/***72. Continue olmesartan  Heart healthy low-sodium diet Maintain blood pressure log-reviewed  Hyperlipidemia-LDL 19***7 on 12/07/2022.  He is statin intolerant.  Prefers a holistic approach. High-fiber diet Increase physical activity as tolerated Repeat fasting lipids and LFTs  Disposition: Follow-up with Dr. Rolm Clos or me in 1 year.  Chet Cota. Charlese Gruetzmacher NP-C     12/24/2023, 6:55 AM [facility] [address] Office [phone] Fax [phone]    I spent 15*** minutes examining this patient, reviewing medications, and using patient centered shared decision making involving her cardiac care.   I spent  20 minutes reviewing past medical history,  medications, and prior cardiac tests.

## 2023-12-28 ENCOUNTER — Ambulatory Visit: Attending: General Practice | Admitting: General Practice

## 2023-12-28 ENCOUNTER — Encounter: Payer: Self-pay | Admitting: General Practice

## 2023-12-28 VITALS — BP 112/80 | HR 91 | Ht 72.0 in | Wt 200.8 lb

## 2023-12-28 DIAGNOSIS — I2510 Atherosclerotic heart disease of native coronary artery without angina pectoris: Secondary | ICD-10-CM | POA: Diagnosis not present

## 2023-12-28 DIAGNOSIS — E785 Hyperlipidemia, unspecified: Secondary | ICD-10-CM

## 2023-12-28 DIAGNOSIS — I483 Typical atrial flutter: Secondary | ICD-10-CM

## 2023-12-28 DIAGNOSIS — I10 Essential (primary) hypertension: Secondary | ICD-10-CM | POA: Diagnosis not present

## 2023-12-28 NOTE — Patient Instructions (Signed)
 Medication Instructions:  The current medical regimen is effective;  continue present plan and medications as directed. Please refer to the Current Medication list given to you today.  *If you need a refill on your cardiac medications before your next appointment, please call your pharmacy*  Lab Work: NONE  Follow-Up: At [facility], you and your health needs are our priority.  As part of our continuing mission to provide you with exceptional heart care, our providers are all part of one team.  This team includes your primary Cardiologist (physician) and Advanced Practice Providers or APPs (Physician Assistants and Nurse Practitioners) who all work together to provide you with the care you need, when you need it.  Your next appointment:   12 month(s)  Provider:   Oneil Bigness, MD

## 2024-01-18 ENCOUNTER — Other Ambulatory Visit: Payer: Self-pay | Admitting: Internal Medicine

## 2024-01-18 DIAGNOSIS — I728 Aneurysm of other specified arteries: Secondary | ICD-10-CM

## 2024-01-24 ENCOUNTER — Telehealth: Payer: Self-pay | Admitting: Internal Medicine

## 2024-01-24 NOTE — Telephone Encounter (Signed)
 Copied from CRM [id number]. Topic: General - Other >> Jan 24, 2024  1:53 PM Trula Gable C wrote: Reason for CRM: Tanya Fantasia from [facility] called in today stated they need the authorization for the patient schedule appointment for CT states that need it tomorrow by 10 am

## 2024-01-25 ENCOUNTER — Encounter: Payer: Self-pay | Admitting: Internal Medicine

## 2024-01-25 NOTE — Telephone Encounter (Signed)
 Message sent to referral coordinator to get the status of patient auth

## 2024-01-25 NOTE — Telephone Encounter (Signed)
 Received message from referral coordinator, she will start the prior auth for this patient CT

## 2024-01-26 ENCOUNTER — Ambulatory Visit
Admission: RE | Admit: 2024-01-26 | Discharge: 2024-01-26 | Disposition: A | Discharge status: Home / Self Care | Source: Ambulatory Visit | Attending: Internal Medicine | Admitting: Internal Medicine

## 2024-01-26 DIAGNOSIS — I728 Aneurysm of other specified arteries: Secondary | ICD-10-CM

## 2024-01-26 MED ORDER — IOPAMIDOL (ISOVUE-370) INJECTION 76%
100.0000 mL | Freq: Once | INTRAVENOUS | Status: AC | PRN
  Administered 2024-01-26: 100 mL via INTRAVENOUS

## 2024-01-28 ENCOUNTER — Other Ambulatory Visit: Payer: Self-pay | Admitting: Internal Medicine

## 2024-01-28 DIAGNOSIS — I10 Essential (primary) hypertension: Secondary | ICD-10-CM

## 2024-01-28 DIAGNOSIS — I4892 Unspecified atrial flutter: Secondary | ICD-10-CM

## 2024-01-28 NOTE — Telephone Encounter (Unsigned)
 Copied from CRM [id number]. Topic: Clinical - Medication Refill >> Jan 28, 2024 10:14 AM Oddis Bench wrote: Medication: diltiazem  (CARDIZEM  CD) 120 MG 24 hr capsule  Has the patient contacted their pharmacy? No Patient is out of town and forgot to pack the med   This is the patient's preferred pharmacy:  [facility] [address]  [place], [place] [address]  Phone: [phone]  Is this the correct pharmacy for this prescription? Yes If no, delete pharmacy and type the correct one.   Has the prescription been filled recently? Yes  Is the patient out of the medication? Yes  Has the patient been seen for an appointment in the last year OR does the patient have an upcoming appointment? Yes  Can we respond through MyChart? Yes  Agent: Please be advised that Rx refills may take up to 3 business days. We ask that you follow-up with your pharmacy. patient is calling he is in [place] and he left one of his pills and he is having atril flutters and he need a script called in to the pharmacy

## 2024-02-01 MED ORDER — DILTIAZEM HCL ER COATED BEADS 120 MG PO CP24: 120.0000 mg | ORAL_CAPSULE | Freq: Every day | ORAL | 0 refills | Status: DC

## 2024-02-06 ENCOUNTER — Ambulatory Visit: Payer: Self-pay | Admitting: Internal Medicine

## 2024-02-27 ENCOUNTER — Other Ambulatory Visit: Payer: Self-pay | Admitting: Internal Medicine

## 2024-02-27 DIAGNOSIS — I4892 Unspecified atrial flutter: Secondary | ICD-10-CM

## 2024-02-27 DIAGNOSIS — I10 Essential (primary) hypertension: Secondary | ICD-10-CM

## 2024-03-01 ENCOUNTER — Other Ambulatory Visit: Payer: Self-pay | Admitting: Internal Medicine

## 2024-03-01 DIAGNOSIS — I4892 Unspecified atrial flutter: Secondary | ICD-10-CM

## 2024-03-01 NOTE — Telephone Encounter (Signed)
 Copied from CRM [id number]. Topic: Clinical - Medication Refill >> Mar 01, 2024 11:24 AM Rosina BIRCH wrote: Medication: apixaban  (ELIQUIS ) 5 MG TABS tablet  Has the patient contacted their pharmacy? Yes (Agent: If no, request that the patient contact the pharmacy for the refill. If patient does not wish to contact the pharmacy document the reason why and proceed with request.) (Agent: If yes, when and what did the pharmacy advise?)  This is the patient's preferred pharmacy:  [id number] [place], [place] - [address] [place] [address] Phone: [phone] Fax: [phone]  Is this the correct pharmacy for this prescription? Yes If no, delete pharmacy and type the correct one.   Has the prescription been filled recently? No  Is the patient out of the medication? Yes  Has the patient been seen for an appointment in the last year OR does the patient have an upcoming appointment? Yes  Can we respond through MyChart? Yes  Agent: Please be advised that Rx refills may take up to 3 business days. We ask that you follow-up with your pharmacy.

## 2024-03-02 NOTE — Telephone Encounter (Signed)
 Copied from CRM [id number]. Topic: Clinical - Medication Question >> Mar 02, 2024 10:01 AM Mesmerise C wrote: Reason for CRM: Patient has scheduled his appointment the soonest was 8/11 but has gone 2 days without his Eliquis  patient needing it until meantime

## 2024-03-02 NOTE — Telephone Encounter (Signed)
 Patient is due for follow up this month - I have called and left voicemail for patient to call back and schedule.   Copied from CRM [id number]. Topic: Clinical - Medication Refill >> Mar 01, 2024 11:24 AM Rosina BIRCH wrote: Medication: apixaban  (ELIQUIS ) 5 MG TABS tablet  Has the patient contacted their pharmacy? Yes (Agent: If no, request that the patient contact the pharmacy for the refill. If patient does not wish to contact the pharmacy document the reason why and proceed with request.) (Agent: If yes, when and what did the pharmacy advise?)  This is the patient's preferred pharmacy:  [id number] [place], [place] - [address] [place] [address] Phone: [phone] Fax: [phone]  Is this the correct pharmacy for this prescription? Yes If no, delete pharmacy and type the correct one.   Has the prescription been filled recently? No  Is the patient out of the medication? Yes  Has the patient been seen for an appointment in the last year OR does the patient have an upcoming appointment? Yes  Can we respond through MyChart? Yes  Agent: Please be advised that Rx refills may take up to 3 business days. We ask that you follow-up with your pharmacy. >> Mar 02, 2024  9:41 AM Drema MATSU wrote: Patient is calling to check the status of medication. Advised of turn around time

## 2024-03-20 ENCOUNTER — Ambulatory Visit (INDEPENDENT_AMBULATORY_CARE_PROVIDER_SITE_OTHER)

## 2024-03-20 VITALS — Ht 72.0 in | Wt 200.0 lb

## 2024-03-20 DIAGNOSIS — Z0000 Encounter for general adult medical examination without abnormal findings: Secondary | ICD-10-CM

## 2024-03-20 NOTE — Progress Notes (Signed)
 Subjective:   Ronald Franklin is a 73 y.o. who presents for a Medicare Wellness preventive visit.  As a reminder, Annual Wellness Visits don't include a physical exam, and some assessments may be limited, especially if this visit is performed virtually. We may recommend an in-person follow-up visit with your provider if needed.  Visit Complete: Virtual I connected with  Charlie MARLA Haddock on 03/20/24 by a audio enabled telemedicine application and verified that I am speaking with the correct person using two identifiers.  Patient Location: Home  Provider Location: Office/Clinic  I discussed the limitations of evaluation and management by telemedicine. The patient expressed understanding and agreed to proceed.  Vital Signs: Because this visit was a virtual/telehealth visit, some criteria may be missing or patient reported. Any vitals not documented were not able to be obtained and vitals that have been documented are patient reported.  VideoDeclined- This patient declined Librarian, academic. Therefore the visit was completed with audio only.  Persons Participating in Visit: Patient.  AWV Questionnaire: Yes: Patient Medicare AWV questionnaire was completed by the patient on 03/20/2024; I have confirmed that all information answered by patient is correct and no changes since this date.  Cardiac Risk Factors include: advanced age (>36men, >67 women);dyslipidemia;hypertension;male gender     Objective:    Today's Vitals   03/20/24 1554  Weight: 200 lb (90.7 kg)  Height: 6' (1.829 m)   Body mass index is 27.12 kg/m.     03/20/2024    3:53 PM 12/01/2021    1:19 PM 11/19/2020    3:59 PM 09/20/2020    9:28 AM 09/16/2020    8:41 AM 08/31/2020   11:58 AM 01/19/2019   10:31 AM  Advanced Directives  Does Patient Have a Medical Advance Directive? Yes Yes Yes Yes Yes No No  Type of Estate agent of [place];Living will Healthcare Power of Attorney  Living will;Healthcare Power of [employer] Power of [place];Living will    Does patient want to make changes to medical advance directive?   No - Patient declined      Copy of Healthcare Power of Attorney in Chart? No - copy requested No - copy requested No - copy requested  No - copy requested    Would patient like information on creating a medical advance directive?       No - Patient declined      Data saved with a previous flowsheet row definition    Current Medications (verified) Outpatient Encounter Medications as of 03/20/2024  Medication Sig   Ascorbic Acid  (VITAMIN C ) 1000 MG tablet Take 2,000 mg by mouth daily.   Cholecalciferol (VITAMIN D-3) 125 MCG (5000 UT) TABS Take 5,000 Units by mouth daily.   diltiazem  (CARDIZEM  CD) 120 MG 24 hr capsule TAKE 1 CAPSULE BY MOUTH EVERY DAY   ELIQUIS  5 MG TABS tablet TAKE 1 TABLET BY MOUTH TWICE A DAY   Menaquinone-7 (VITAMIN K2 PO) Take 1 tablet by mouth daily.   Misc Natural Products (OSTEO BI-FLEX TRIPLE STRENGTH PO) Take 1 tablet by mouth daily.   Multiple Vitamin (MULTIVITAMIN WITH MINERALS) TABS tablet Take 1 tablet by mouth daily. One-A-Day Men's Health   Turmeric 500 MG CAPS Take 500 mg by mouth daily.    vitamin E 400 UNIT capsule Take 800 Units by mouth daily.   VOLTAREN 1 % GEL Apply topically 4 (four) times daily.   Zinc 50 MG TABS Take 50 mg by mouth daily.  No facility-administered encounter medications on file as of 03/20/2024.    Allergies (verified) Poison ivy extract   History: Past Medical History:  Diagnosis Date   Allergic rhinitis    Arthritis    CTS (carpal tunnel syndrome)    Hypertension    OSA (obstructive sleep apnea)    Type II diabetes mellitus with manifestations (HCC) 09/20/2020   Past Surgical History:  Procedure Laterality Date   BACK SURGERY     C4-5-6   2 cervical    from [facility]   CARDIOVERSION N/A 09/20/2020   Procedure: CARDIOVERSION;  Surgeon: Francyne Headland, MD;  Location: [facility];   Service: Cardiovascular;  Laterality: N/A;   CARPAL TUNNEL RELEASE  09/16/2011   Procedure: CARPAL TUNNEL RELEASE;  Surgeon: Catalina CHRISTELLA Stains, MD;  Location: [facility];  Service: Neurosurgery;  Laterality: Left;  Left Carpal Tunnel Release   CERVICAL LAMINECTOMY     EXTRACORPOREAL SHOCK WAVE LITHOTRIPSY Left 09/16/2020   Procedure: EXTRACORPOREAL SHOCK WAVE LITHOTRIPSY (ESWL);  Surgeon: Renda Glance, MD;  Location: [facility];  Service: Urology;  Laterality: Left;   KNEE ARTHROSCOPY WITH MEDIAL MENISECTOMY  07/06/2012   Procedure: KNEE ARTHROSCOPY WITH MEDIAL MENISECTOMY;  Surgeon: Lynwood SHAUNNA Bern, MD;  Location: [facility];  Service: Orthopedics;  Laterality: Left;  LEFT KNEE ARTHROSCOPY WITH PARTIAL MEDIAL MENISECTOMY   KNEE SURGERY     NECK SURGERY     ROTATOR CUFF REPAIR     bilateral    TEE WITHOUT CARDIOVERSION N/A 09/20/2020   Procedure: TRANSESOPHAGEAL ECHOCARDIOGRAM (TEE);  Surgeon: Francyne Headland, MD;  Location: [facility] ENDOSCOPY;  Service: Cardiovascular;  Laterality: N/A;   TOTAL HIP ARTHROPLASTY Left 01/19/2019   Procedure: TOTAL HIP ARTHROPLASTY ANTERIOR APPROACH;  Surgeon: Fidel Rogue, MD;  Location: [facility];  Service: Orthopedics;  Laterality: Left;   Family History  Problem Relation Age of Onset   Diabetes Brother    Diabetes Brother    Hyperlipidemia Other    Prostate cancer Other    Heart disease Neg Hx    Early death Neg Hx    Alcohol  abuse Neg Hx    Arthritis Neg Hx    Cancer Neg Hx    Stroke Neg Hx    Kidney disease Neg Hx    Hypertension Neg Hx    Social History   Socioeconomic History   Marital status: Married    Spouse name: Not on file   Number of children: 6   Years of education: BS   Highest education level: Bachelor's degree (e.g., BA, AB, BS)  Occupational History   Occupation: Retired Sport and exercise psychologist  Tobacco Use   Smoking status: Never   Smokeless tobacco: Never  Substance and Sexual Activity   Alcohol  use: Yes     Alcohol /week: 5.0 standard drinks of alcohol     Types: 5 Glasses of wine per week    Comment: socially   Drug use: No   Sexual activity: Yes    Birth control/protection: None  Other Topics Concern   Not on file  Social History Narrative   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 Married. Has children.   Drinks 1 cup of coffee a day    Social Drivers of Corporate investment banker Strain: Low Risk  (03/20/2024)   Overall Financial Resource Strain (CARDIA)    Difficulty of Paying Living Expenses: Not hard at all  Food Insecurity: No Food Insecurity (03/20/2024)  Hunger Vital Sign    Worried About Running Out of Food in the Last Year: Never true    Ran Out of Food in the Last Year: Never true  Transportation Needs: No Transportation Needs (03/20/2024)   PRAPARE - Administrator, Civil Service (Medical): No    Lack of Transportation (Non-Medical): No  Physical Activity: Sufficiently Active (03/20/2024)   Exercise Vital Sign    Days of Exercise per Week: 7 days    Minutes of Exercise per Session: 60 min  Recent Concern: Physical Activity - Insufficiently Active (03/20/2024)   Exercise Vital Sign    Days of Exercise per Week: 4 days    Minutes of Exercise per Session: 30 min  Stress: No Stress Concern Present (03/20/2024)   [employer] of Occupational Health - Occupational Stress Questionnaire    Feeling of Stress: Not at all  Social Connections: Moderately Isolated (03/20/2024)   Social Connection and Isolation Panel    Frequency of Communication with Friends and Family: More than three times a week    Frequency of Social Gatherings with Friends and Family: Three times a week    Attends Religious Services: Never    Active Member of Clubs or Organizations: No    Attends Banker Meetings: Never    Marital Status: Married    Tobacco Counseling Counseling given: No    Clinical Intake:  Pre-visit  preparation completed: Yes  Pain : No/denies pain     BMI - recorded: 27.12 Nutritional Status: BMI 25 -29 Overweight Nutritional Risks: None Diabetes: No  Lab Results  Component Value Date   HGBA1C 5.8 11/06/2020   HGBA1C 6.6 (H) 09/18/2020   HGBA1C 5.8 10/30/2019     How often do you need to have someone help you when you read instructions, pamphlets, or other written materials from your doctor or pharmacy?: 1 - Never  Interpreter Needed?: No  Information entered by :: Verdie Saba, CMA   Activities of Daily Living     03/20/2024    3:56 PM  In your present state of health, do you have any difficulty performing the following activities:  Hearing? 0  Comment has hearing aids if need  Vision? 0  Difficulty concentrating or making decisions? 0  Walking or climbing stairs? 0  Dressing or bathing? 0  Doing errands, shopping? 0  Preparing Food and eating ? N  Using the Toilet? N  In the past six months, have you accidently leaked urine? N  Do you have problems with loss of bowel control? N  Managing your Medications? N  Managing your Finances? N  Housekeeping or managing your Housekeeping? N    Patient Care Team: Joshua Debby CROME, MD as PCP - General (Internal Medicine) O'Neal, Darryle Debby, MD as PCP - Cardiology (Cardiology) [facility] (Ophthalmology)  I have updated your Care Teams any recent Medical Services you may have received from other providers in the past year.     Assessment:   This is a routine wellness examination for Ronald Franklin.  Hearing/Vision screen Hearing Screening - Comments:: Denies hearing difficulties but has hearing aids if need Vision Screening - Comments:: Wears rx glasses - up to date with routine eye exams with an Optometrist in [place], [place]   Goals Addressed               This Visit's Progress     Patient Stated (pt-stated)        Patient stated he plans to continue  to exercise and house work       Depression Screen      03/20/2024    3:58 PM 12/02/2023    2:53 PM 12/07/2022    1:45 PM 12/01/2021    1:20 PM 12/01/2021    1:18 PM 11/19/2020    1:59 PM 10/30/2019   10:01 AM  PHQ 2/9 Scores  PHQ - 2 Score 0 0 0 0 0 0 0  PHQ- 9 Score 0 0 0        Fall Risk     03/20/2024    3:57 PM 12/02/2023    2:53 PM 12/07/2022    1:45 PM 12/01/2021    1:20 PM 11/19/2020    1:59 PM  Fall Risk   Falls in the past year? 0 0 0 0 0  Number falls in past yr: 0 0 0 0 0  Injury with Fall? 0 0 0 0 0  Risk for fall due to : No Fall Risks No Fall Risks No Fall Risks    Follow up Falls evaluation completed;Falls prevention discussed Falls evaluation completed Falls evaluation completed Falls evaluation completed       Data saved with a previous flowsheet row definition    MEDICARE RISK AT HOME:  Medicare Risk at Home Any stairs in or around the home?: Yes If so, are there any without handrails?: No Home free of loose throw rugs in walkways, pet beds, electrical cords, etc?: Yes Adequate lighting in your home to reduce risk of falls?: Yes Life alert?: No Use of a cane, walker or w/c?: No Grab bars in the bathroom?: No Shower chair or bench in shower?: Yes Elevated toilet seat or a handicapped toilet?: No  TIMED UP AND GO:  Was the test performed?  No  Cognitive Function: 6CIT completed        03/20/2024    3:59 PM  6CIT Screen  What Year? 0 points  What month? 0 points  What time? 0 points  Count back from 20 0 points  Months in reverse 0 points  Repeat phrase 2 points  Total Score 2 points    Immunizations Immunization History  Administered Date(s) Administered   Influenza Split 05/28/2011   Pneumococcal Conjugate-13 10/07/2015   Pneumococcal Polysaccharide-23 10/22/2016   Td 08/17/2005   Tdap 10/07/2015   Zoster Recombinant(Shingrix) 08/18/2019    Screening Tests Health Maintenance  Topic Date Due   OPHTHALMOLOGY EXAM  Never done   Zoster Vaccines- Shingrix (2 of 2) 10/13/2019   HEMOGLOBIN A1C   05/09/2021   INFLUENZA VACCINE  03/17/2024   Colonoscopy  03/20/2025 (Originally 07/06/2023)   Diabetic kidney evaluation - Urine ACR  06/16/2028 (Originally 12/13/1968)   Diabetic kidney evaluation - eGFR measurement  12/01/2024   Medicare Annual Wellness (AWV)  03/20/2025   DTaP/Tdap/Td (3 - Td or Tdap) 10/06/2025   Fecal DNA (Cologuard)  12/14/2026   Pneumococcal Vaccine: 50+ Years  Completed   Hepatitis C Screening  Completed   Hepatitis B Vaccines  Aged Out   HPV VACCINES  Aged Out   Meningococcal B Vaccine  Aged Out   FOOT EXAM  Discontinued   COVID-19 Vaccine  Discontinued    Health Maintenance  Health Maintenance Due  Topic Date Due   OPHTHALMOLOGY EXAM  Never done   Zoster Vaccines- Shingrix (2 of 2) 10/13/2019   HEMOGLOBIN A1C  05/09/2021   INFLUENZA VACCINE  03/17/2024   Health Maintenance Items Addressed: 03/20/2024  Additional Screening:  Vision Screening:  Recommended annual ophthalmology exams for early detection of glaucoma and other disorders of the eye. Would you like a referral to an eye doctor? No  up to date with routine eye exams with an Optometrist in [place], [place]  Dental Screening: Recommended annual dental exams for proper oral hygiene  Community Resource Referral / Chronic Care Management: CRR required this visit?  No   CCM required this visit?  No   Plan:    I have personally reviewed and noted the following in the patient's chart:   Medical and social history Use of alcohol , tobacco or illicit drugs  Current medications and supplements including opioid prescriptions. Patient is not currently taking opioid prescriptions. Functional ability and status Nutritional status Physical activity Advanced directives List of other physicians Hospitalizations, surgeries, and ER visits in previous 12 months Vitals Screenings to include cognitive, depression, and falls Referrals and appointments  In addition, I have reviewed and discussed with patient  certain preventive protocols, quality metrics, and best practice recommendations. A written personalized care plan for preventive services as well as general preventive health recommendations were provided to patient.   Verdie CHRISTELLA Saba, CMA   03/20/2024   After Visit Summary: (MyChart) Due to this being a telephonic visit, the after visit summary with patients personalized plan was offered to patient via MyChart   Notes: Nothing significant to report at this time.

## 2024-03-20 NOTE — Patient Instructions (Signed)
 Mr. Munter , Thank you for taking time out of your busy schedule to complete your Annual Wellness Visit with me. I enjoyed our conversation and look forward to speaking with you again next year. I, as well as your care team,  appreciate your ongoing commitment to your health goals. Please review the following plan we discussed and let me know if I can assist you in the future. Your Game plan/ To Do List    Referrals: If you haven't heard from the office you've been referred to, please reach out to them at the phone provided.   Follow up Visits: We will see or speak with you next year for your Next Medicare AWV with our clinical staff Have you seen your provider in the last 6 months (3 months if uncontrolled diabetes)? No  Clinician Recommendations:  Aim for 30 minutes of exercise or brisk walking, 6-8 glasses of water , and 5 servings of fruits and vegetables each day. Educated and advised on getting the Shingles vaccine      This is a list of the screenings recommended for you:  Health Maintenance  Topic Date Due   Eye exam for diabetics  Never done   Zoster (Shingles) Vaccine (2 of 2) 10/13/2019   Hemoglobin A1C  05/09/2021   Flu Shot  03/17/2024   Colon Cancer Screening  03/20/2025*   Yearly kidney health urinalysis for diabetes  06/16/2028*   Yearly kidney function blood test for diabetes  12/01/2024   Medicare Annual Wellness Visit  03/20/2025   DTaP/Tdap/Td vaccine (3 - Td or Tdap) 10/06/2025   Cologuard (Stool DNA test)  12/14/2026   Pneumococcal Vaccine for age over 13  Completed   Hepatitis C Screening  Completed   Hepatitis B Vaccine  Aged Out   HPV Vaccine  Aged Out   Meningitis B Vaccine  Aged Out   Complete foot exam   Discontinued   COVID-19 Vaccine  Discontinued  *Topic was postponed. The date shown is not the original due date.    Advanced directives: (Copy Requested) Please bring a copy of your health care power of attorney and living will to the office to be added  to your chart at your convenience. You can mail to [facility] [address] [place], [place] [address] or email to [email or web address] Advance Care Planning is important because it:  [x]  Makes sure you receive the medical care that is consistent with your values, goals, and preferences  [x]  It provides guidance to your family and loved ones and reduces their decisional burden about whether or not they are making the right decisions based on your wishes.  Follow the link provided in your after visit summary or read over the paperwork we have mailed to you to help you started getting your Advance Directives in place. If you need assistance in completing these, please reach out to us  so that we can help you!

## 2024-03-21 ENCOUNTER — Ambulatory Visit (INDEPENDENT_AMBULATORY_CARE_PROVIDER_SITE_OTHER): Admitting: Internal Medicine

## 2024-03-21 ENCOUNTER — Encounter: Payer: Self-pay | Admitting: Internal Medicine

## 2024-03-21 VITALS — BP 132/86 | HR 76 | Temp 98.6°F | Resp 16 | Ht 72.0 in | Wt 196.4 lb

## 2024-03-21 DIAGNOSIS — I10 Essential (primary) hypertension: Secondary | ICD-10-CM | POA: Diagnosis not present

## 2024-03-21 DIAGNOSIS — I4892 Unspecified atrial flutter: Secondary | ICD-10-CM | POA: Diagnosis not present

## 2024-03-21 DIAGNOSIS — M1712 Unilateral primary osteoarthritis, left knee: Secondary | ICD-10-CM

## 2024-03-21 NOTE — Progress Notes (Signed)
 Subjective:  Patient ID: Ronald Franklin, male    DOB: 1950-12-26  Age: 73 y.o. MRN: [id number]  CC: Hypertension and Atrial Fibrillation   HPI Ronald Franklin presents for f/up ----  Discussed the use of AI scribe software for clinical note transcription with the patient, who gave verbal consent to proceed.  History of Present Illness Ronald Franklin is a 73 year old male who presents with left knee pain and mechanical symptoms.  He experiences significant mechanical symptoms in his left knee following an incident where he misjudged a step while descending a ladder. He describes a sensation of 'crunching' and 'popping' in the joint, particularly when bending or moving. Initially, he was unable to lift and straighten his leg, and he heard a 'snap' during the incident. Although the knee does not visibly swell, it feels 'bloated' and as if it is 'wanting to pull apart'. These symptoms have persisted since the incident.  He is physically active, engaging in activities such as painting on a roof and working with carpenters, which involves hauling and carrying materials. Despite the knee issue, he reports good endurance, as evidenced by his ability to walk up a steep driveway at high altitude without stopping or experiencing shortness of breath.  No palpitations, dizziness, lightheadedness, or any episodes of passing out. No pain or swelling in other areas and no bleeding or bruising issues while on Eliquis , although he notes occasional small red spots under the skin that resolve quickly.    Outpatient Medications Prior to Visit  Medication Sig Dispense Refill   Ascorbic Acid  (VITAMIN C ) 1000 MG tablet Take 2,000 mg by mouth daily.     Cholecalciferol (VITAMIN D-3) 125 MCG (5000 UT) TABS Take 5,000 Units by mouth daily.     diltiazem  (CARDIZEM  CD) 120 MG 24 hr capsule TAKE 1 CAPSULE BY MOUTH EVERY DAY 90 capsule 0   ELIQUIS  5 MG TABS tablet TAKE 1 TABLET BY MOUTH TWICE A DAY 180 tablet 0    Menaquinone-7 (VITAMIN K2 PO) Take 1 tablet by mouth daily.     Misc Natural Products (OSTEO BI-FLEX TRIPLE STRENGTH PO) Take 1 tablet by mouth daily.     Multiple Vitamin (MULTIVITAMIN WITH MINERALS) TABS tablet Take 1 tablet by mouth daily. One-A-Day Men's Health     Turmeric 500 MG CAPS Take 500 mg by mouth daily.      vitamin E 400 UNIT capsule Take 800 Units by mouth daily.     VOLTAREN 1 % GEL Apply topically 4 (four) times daily.     Zinc 50 MG TABS Take 50 mg by mouth daily.     No facility-administered medications prior to visit.    ROS Review of Systems  Constitutional:  Negative for appetite change, chills, diaphoresis, fatigue and fever.  HENT: Negative.    Eyes: Negative.   Respiratory: Negative.  Negative for cough, chest tightness, shortness of breath and wheezing.   Cardiovascular:  Negative for chest pain, palpitations and leg swelling.  Gastrointestinal: Negative.  Negative for abdominal pain, constipation, diarrhea, nausea and vomiting.  Endocrine: Negative.   Genitourinary: Negative.  Negative for difficulty urinating.  Musculoskeletal:  Positive for arthralgias.  Skin: Negative.   Neurological:  Negative for dizziness and weakness.  Hematological:  Negative for adenopathy. Does not bruise/bleed easily.  Psychiatric/Behavioral: Negative.      Objective:  BP 132/86 (BP Location: Left Arm, Patient Position: Sitting, Cuff Size: Normal)   Pulse 76   Temp 98.6 F (37  C) (Oral)   Resp 16   Ht 6' (1.829 m)   Wt 196 lb 6.4 oz (89.1 kg)   SpO2 99%   BMI 26.64 kg/m   BP Readings from Last 3 Encounters:  03/21/24 132/86  12/28/23 112/80  12/02/23 138/86    Wt Readings from Last 3 Encounters:  03/21/24 196 lb 6.4 oz (89.1 kg)  03/20/24 200 lb (90.7 kg)  12/28/23 200 lb 12.8 oz (91.1 kg)    Physical Exam Vitals reviewed.  Constitutional:      Appearance: Normal appearance.  HENT:     Nose: Nose normal.     Mouth/Throat:     Mouth: Mucous membranes  are moist.  Eyes:     General: No scleral icterus.    Conjunctiva/sclera: Conjunctivae normal.  Cardiovascular:     Rate and Rhythm: Normal rate and regular rhythm.     Pulses: Normal pulses.     Heart sounds: No murmur heard.    No friction rub. No gallop.  Pulmonary:     Effort: Pulmonary effort is normal.     Breath sounds: No stridor. No wheezing, rhonchi or rales.  Abdominal:     General: Abdomen is flat.     Palpations: There is no mass.     Tenderness: There is no abdominal tenderness. There is no guarding.     Hernia: No hernia is present.  Musculoskeletal:        General: No swelling. Normal range of motion.     Cervical back: Neck supple.     Right knee: Normal.     Left knee: Crepitus present. No swelling, deformity, effusion, erythema or bony tenderness. Normal range of motion.     Right lower leg: No edema.     Left lower leg: No edema.  Lymphadenopathy:     Cervical: No cervical adenopathy.  Skin:    General: Skin is warm and dry.  Neurological:     General: No focal deficit present.     Mental Status: He is alert. Mental status is at baseline.  Psychiatric:        Mood and Affect: Mood normal.        Behavior: Behavior normal.     Lab Results  Component Value Date   WBC 8.4 12/02/2023   HGB 14.8 12/02/2023   HCT 44.8 12/02/2023   PLT 184.0 12/02/2023   GLUCOSE 83 12/02/2023   CHOL 209 (H) 12/02/2023   TRIG 114.0 12/02/2023   HDL 50.80 12/02/2023   LDLDIRECT 206.6 08/04/2013   LDLCALC 136 (H) 12/02/2023   ALT 21 12/02/2023   AST 19 12/02/2023   NA 140 12/02/2023   K 4.5 12/02/2023   CL 104 12/02/2023   CREATININE 0.98 12/02/2023   BUN 18 12/02/2023   CO2 31 12/02/2023   TSH 2.54 12/02/2023   PSA 1.84 12/02/2023   INR 1.0 07/02/2008   HGBA1C 5.8 11/06/2020    CT ANGIO ABDOMEN W &/OR WO CONTRAST Result Date: 02/05/2024 CLINICAL DATA:  Follow-up of known splenic artery aneurysm. EXAM: CT ANGIOGRAPHY ABDOMEN TECHNIQUE: Multidetector CT imaging  of the abdomen was performed using the standard protocol during bolus administration of intravenous contrast. Multiplanar reconstructed images and MIPs were obtained and reviewed to evaluate the vascular anatomy. RADIATION DOSE REDUCTION: This exam was performed according to the departmental dose-optimization program which includes automated exposure control, adjustment of the mA and/or kV according to patient size and/or use of iterative reconstruction technique. CONTRAST:  100mL ISOVUE -370 IOPAMIDOL  (ISOVUE -370)  INJECTION 76% COMPARISON:  Prior CTA studies in 2024, 2023 and 2022 FINDINGS: VASCULAR Aorta: Stable aortic atherosclerosis with stable mild anterior plaque ulceration just above the IMA origin and maximal diameter of 2.8 cm at this level. Celiac: Normally patent celiac axis with normal branching anatomy. 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 This is stable since 2022. Internal aspect of the aneurysm remains patent without mural thrombus. No evidence of aneurysm rupture or signs of surrounding hemorrhage. Native splenic artery is patent. No other splenic artery aneurysms. SMA: Normally patent. Renals: Normally patent single right renal artery. Calcified plaque at the origin of the left renal artery without significant stenosis. IMA: Normally patent. Inflow: Visualized common iliac artery origins are normally patent. Review of the MIP images confirms the above findings. NON-VASCULAR Lower chest: No acute abnormality. Hepatobiliary: No focal liver abnormality is seen. No gallstones, gallbladder wall thickening, or biliary dilatation. Pancreas: Unremarkable. No pancreatic ductal dilatation or surrounding inflammatory changes. Spleen: Normal in size without focal abnormality. Adrenals/Urinary Tract: Adrenal glands are unremarkable. Kidneys are normal,  without renal calculi, focal lesion, or hydronephrosis. Stomach/Bowel: Stable posterior fundal gastric diverticulum. No evidence of bowel obstruction, ileus or free intraperitoneal air. Lymphatic: No enlarged lymph nodes identified. Other: No abdominal wall hernia. No abnormal fluid collection or ascites. Musculoskeletal: No acute or significant osseous findings. IMPRESSION: 1. 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 This is stable since 2022. No evidence of aneurysm rupture or signs of surrounding hemorrhage. 2. Stable aortic atherosclerosis with stable mild anterior plaque ulceration just above the IMA origin and maximal diameter of 2.8 cm at this level. 3. Stable posterior fundal gastric diverticulum. Electronically Signed   By: Marcey Moan M.D.   On: 02/05/2024 10:25    Assessment & Plan:   Essential hypertension- His BP is well controlled.  Primary osteoarthritis of left knee -     Ambulatory referral to Orthopedic Surgery  Atrial flutter by electrocardiogram [facility])- He has good R/R control. Will continue the CCB.     Follow-up: Return in about 6 months (around 09/21/2024).  Debby Molt, MD

## 2024-03-21 NOTE — Patient Instructions (Signed)
 Hypertension, Adult High blood pressure (hypertension) is when the force of blood pumping through the arteries is too strong. The arteries are the blood vessels that carry blood from the heart throughout the body. Hypertension forces the heart to work harder to pump blood and may cause arteries to become narrow or stiff. Untreated or uncontrolled hypertension can lead to a heart attack, heart failure, a stroke, kidney disease, and other problems. A blood pressure reading consists of a higher number over a lower number. Ideally, your blood pressure should be below 120/80. The first ("top") number is called the systolic pressure. It is a measure of the pressure in your arteries as your heart beats. The second ("bottom") number is called the diastolic pressure. It is a measure of the pressure in your arteries as the heart relaxes. What are the causes? The exact cause of this condition is not known. There are some conditions that result in high blood pressure. What increases the risk? Certain factors may make you more likely to develop high blood pressure. Some of these risk factors are under your control, including: Smoking. Not getting enough exercise or physical activity. Being overweight. Having too much fat, sugar, calories, or salt (sodium) in your diet. Drinking too much alcohol. Other risk factors include: Having a personal history of heart disease, diabetes, high cholesterol, or kidney disease. Stress. Having a family history of high blood pressure and high cholesterol. Having obstructive sleep apnea. Age. The risk increases with age. What are the signs or symptoms? High blood pressure may not cause symptoms. Very high blood pressure (hypertensive crisis) may cause: Headache. Fast or irregular heartbeats (palpitations). Shortness of breath. Nosebleed. Nausea and vomiting. Vision changes. Severe chest pain, dizziness, and seizures. How is this diagnosed? This condition is diagnosed by  measuring your blood pressure while you are seated, with your arm resting on a flat surface, your legs uncrossed, and your feet flat on the floor. The cuff of the blood pressure monitor will be placed directly against the skin of your upper arm at the level of your heart. Blood pressure should be measured at least twice using the same arm. Certain conditions can cause a difference in blood pressure between your right and left arms. If you have a high blood pressure reading during one visit or you have normal blood pressure with other risk factors, you may be asked to: Return on a different day to have your blood pressure checked again. Monitor your blood pressure at home for 1 week or longer. If you are diagnosed with hypertension, you may have other blood or imaging tests to help your health care provider understand your overall risk for other conditions. How is this treated? This condition is treated by making healthy lifestyle changes, such as eating healthy foods, exercising more, and reducing your alcohol intake. You may be referred for counseling on a healthy diet and physical activity. Your health care provider may prescribe medicine if lifestyle changes are not enough to get your blood pressure under control and if: Your systolic blood pressure is above 130. Your diastolic blood pressure is above 80. Your personal target blood pressure may vary depending on your medical conditions, your age, and other factors. Follow these instructions at home: Eating and drinking  Eat a diet that is high in fiber and potassium, and low in sodium, added sugar, and fat. An example of this eating plan is called the DASH diet. DASH stands for Dietary Approaches to Stop Hypertension. To eat this way: Eat  plenty of fresh fruits and vegetables. Try to fill one half of your plate at each meal with fruits and vegetables. Eat whole grains, such as whole-wheat pasta, brown rice, or whole-grain bread. Fill about one  fourth of your plate with whole grains. Eat or drink low-fat dairy products, such as skim milk or low-fat yogurt. Avoid fatty cuts of meat, processed or cured meats, and poultry with skin. Fill about one fourth of your plate with lean proteins, such as fish, chicken without skin, beans, eggs, or tofu. Avoid pre-made and processed foods. These tend to be higher in sodium, added sugar, and fat. Reduce your daily sodium intake. Many people with hypertension should eat less than 1,500 mg of sodium a day. Do not drink alcohol if: Your health care provider tells you not to drink. You are pregnant, may be pregnant, or are planning to become pregnant. If you drink alcohol: Limit how much you have to: 0-1 drink a day for women. 0-2 drinks a day for men. Know how much alcohol is in your drink. In the [place] one drink equals one 12 oz bottle of beer (355 mL), one 5 oz glass of wine (148 mL), or one 1 oz glass of hard liquor (44 mL). Lifestyle  Work with your health care provider to maintain a healthy body weight or to lose weight. Ask what an ideal weight is for you. Get at least 30 minutes of exercise that causes your heart to beat faster (aerobic exercise) most days of the week. Activities may include walking, swimming, or biking. Include exercise to strengthen your muscles (resistance exercise), such as Pilates or lifting weights, as part of your weekly exercise routine. Try to do these types of exercises for 30 minutes at least 3 days a week. Do not use any products that contain nicotine or tobacco. These products include cigarettes, chewing tobacco, and vaping devices, such as e-cigarettes. If you need help quitting, ask your health care provider. Monitor your blood pressure at home as told by your health care provider. Keep all follow-up visits. This is important. Medicines Take over-the-counter and prescription medicines only as told by your health care provider. Follow directions carefully. Blood  pressure medicines must be taken as prescribed. Do not skip doses of blood pressure medicine. Doing this puts you at risk for problems and can make the medicine less effective. Ask your health care provider about side effects or reactions to medicines that you should watch for. Contact a health care provider if you: Think you are having a reaction to a medicine you are taking. Have headaches that keep coming back (recurring). Feel dizzy. Have swelling in your ankles. Have trouble with your vision. Get help right away if you: Develop a severe headache or confusion. Have unusual weakness or numbness. Feel faint. Have severe pain in your chest or abdomen. Vomit repeatedly. Have trouble breathing. These symptoms may be an emergency. Get help right away. Call 911. Do not wait to see if the symptoms will go away. Do not drive yourself to the hospital. Summary Hypertension is when the force of blood pumping through your arteries is too strong. If this condition is not controlled, it may put you at risk for serious complications. Your personal target blood pressure may vary depending on your medical conditions, your age, and other factors. For most people, a normal blood pressure is less than 120/80. Hypertension is treated with lifestyle changes, medicines, or a combination of both. Lifestyle changes include losing weight, eating a healthy,  low-sodium diet, exercising more, and limiting alcohol. This information is not intended to replace advice given to you by your health care provider. Make sure you discuss any questions you have with your health care provider. Document Revised: 06/10/2021 Document Reviewed: 06/10/2021 Elsevier Patient Education  2024 [employer].

## 2024-03-27 ENCOUNTER — Ambulatory Visit: Admitting: Internal Medicine

## 2024-05-22 ENCOUNTER — Other Ambulatory Visit: Payer: Self-pay

## 2024-05-22 ENCOUNTER — Telehealth: Payer: Self-pay

## 2024-05-22 DIAGNOSIS — I10 Essential (primary) hypertension: Secondary | ICD-10-CM

## 2024-05-22 DIAGNOSIS — I4892 Unspecified atrial flutter: Secondary | ICD-10-CM

## 2024-05-22 MED ORDER — DILTIAZEM HCL ER COATED BEADS 120 MG PO CP24: 120.0000 mg | ORAL_CAPSULE | Freq: Every day | ORAL | 0 refills | Status: DC

## 2024-05-22 NOTE — Telephone Encounter (Signed)
 Copied from CRM [id number]. Topic: Clinical - Prescription Issue >> May 22, 2024 11:17 AM Ronald Franklin wrote: Reason for CRM: Patient states the  diltiazem  (CARDIZEM  CD) 120 MG 24 hr capsule Is being sent to wrong pharmacy  It was supposed to go to  [id number] [place], [place] - [address] [place] [address] Phone: [phone] Fax: [phone] Hours: Not open 24 hours

## 2024-05-22 NOTE — Telephone Encounter (Signed)
 Medication has been sent to the correct pharmacy. Patient has been made aware

## 2024-06-02 ENCOUNTER — Other Ambulatory Visit: Payer: Self-pay | Admitting: Internal Medicine

## 2024-06-02 DIAGNOSIS — I4892 Unspecified atrial flutter: Secondary | ICD-10-CM

## 2024-07-04 ENCOUNTER — Ambulatory Visit: Admitting: Internal Medicine

## 2024-07-04 ENCOUNTER — Encounter: Payer: Self-pay | Admitting: Internal Medicine

## 2024-07-04 VITALS — BP 130/78 | HR 77 | Temp 98.5°F | Resp 16 | Ht 72.0 in | Wt 196.0 lb

## 2024-07-04 DIAGNOSIS — E785 Hyperlipidemia, unspecified: Secondary | ICD-10-CM | POA: Diagnosis not present

## 2024-07-04 DIAGNOSIS — I4892 Unspecified atrial flutter: Secondary | ICD-10-CM | POA: Diagnosis not present

## 2024-07-04 DIAGNOSIS — I10 Essential (primary) hypertension: Secondary | ICD-10-CM | POA: Diagnosis not present

## 2024-07-04 LAB — HEPATIC FUNCTION PANEL
ALT: 26 U/L (ref 0–53)
AST: 20 U/L (ref 0–37)
Albumin: 4.6 g/dL (ref 3.5–5.2)
Alkaline Phosphatase: 59 U/L (ref 39–117)
Bilirubin, Direct: 0.1 mg/dL (ref 0.0–0.3)
Total Bilirubin: 0.6 mg/dL (ref 0.2–1.2)
Total Protein: 7.1 g/dL (ref 6.0–8.3)

## 2024-07-04 LAB — CBC WITH DIFFERENTIAL/PLATELET
Basophils Absolute: 0 K/uL (ref 0.0–0.1)
Basophils Relative: 0.5 % (ref 0.0–3.0)
Eosinophils Absolute: 0.2 K/uL (ref 0.0–0.7)
Eosinophils Relative: 2.2 % (ref 0.0–5.0)
HCT: 46.1 % (ref 39.0–52.0)
Hemoglobin: 15.8 g/dL (ref 13.0–17.0)
Lymphocytes Relative: 41.4 % (ref 12.0–46.0)
Lymphs Abs: 4 K/uL (ref 0.7–4.0)
MCHC: 34.2 g/dL (ref 30.0–36.0)
MCV: 95 fl (ref 78.0–100.0)
Monocytes Absolute: 0.6 K/uL (ref 0.1–1.0)
Monocytes Relative: 6.1 % (ref 3.0–12.0)
Neutro Abs: 4.8 K/uL (ref 1.4–7.7)
Neutrophils Relative %: 49.8 % (ref 43.0–77.0)
Platelets: 170 K/uL (ref 150.0–400.0)
RBC: 4.86 Mil/uL (ref 4.22–5.81)
RDW: 13.9 % (ref 11.5–15.5)
WBC: 9.6 K/uL (ref 4.0–10.5)

## 2024-07-04 LAB — BASIC METABOLIC PANEL WITH GFR
BUN: 15 mg/dL (ref 6–23)
CO2: 29 meq/L (ref 19–32)
Calcium: 9.7 mg/dL (ref 8.4–10.5)
Chloride: 102 meq/L (ref 96–112)
Creatinine, Ser: 0.95 mg/dL (ref 0.40–1.50)
GFR: 78.82 mL/min (ref >60.00)
Glucose, Bld: 99 mg/dL (ref 70–99)
Potassium: 4.4 meq/L (ref 3.5–5.1)
Sodium: 139 meq/L (ref 135–145)

## 2024-07-04 LAB — TSH: TSH: 2.25 u[IU]/mL (ref 0.35–5.50)

## 2024-07-04 NOTE — Patient Instructions (Signed)

## 2024-07-04 NOTE — Progress Notes (Unsigned)
 Subjective:  Patient ID: Ronald Franklin, male    DOB: June 04, 1951  Age: 73 y.o. MRN: [id number]  CC: Hypertension   HPI Ronald Franklin presents for f/up -----  Discussed the use of AI scribe software for clinical note transcription with the patient, who gave verbal consent to proceed.  History of Present Illness Ronald Franklin is a 73 year old male with atrial fibrillation who presents for follow-up and EKG evaluation.  He has no current symptoms of palpitations, dizziness, lightheadedness, or weakness. He is taking a blood thinner.  He mentions that his sister's friends and her husband have had atrial fibrillation and underwent a new procedure, nonthermal pulse field ablation, which resulted in symptom resolution and discontinuation of Eliquis  for her husband.  He recalls seeing a doctor named Vernice for his atrial fibrillation and expresses a preference to return to this provider for further management.   Outpatient Medications Prior to Visit  Medication Sig Dispense Refill   apixaban  (ELIQUIS ) 5 MG TABS tablet TAKE 1 TABLET BY MOUTH TWICE A DAY 180 tablet 1   Ascorbic Acid  (VITAMIN C ) 1000 MG tablet Take 2,000 mg by mouth daily.     Cholecalciferol (VITAMIN D-3) 125 MCG (5000 UT) TABS Take 5,000 Units by mouth daily.     Menaquinone-7 (VITAMIN K2 PO) Take 1 tablet by mouth daily.     Misc Natural Products (OSTEO BI-FLEX TRIPLE STRENGTH PO) Take 1 tablet by mouth daily.     Multiple Vitamin (MULTIVITAMIN WITH MINERALS) TABS tablet Take 1 tablet by mouth daily. One-A-Day Men's Health     Turmeric 500 MG CAPS Take 500 mg by mouth daily.      vitamin E 400 UNIT capsule Take 800 Units by mouth daily.     VOLTAREN 1 % GEL Apply topically 4 (four) times daily.     Zinc 50 MG TABS Take 50 mg by mouth daily.     diltiazem  (CARDIZEM  CD) 120 MG 24 hr capsule Take 1 capsule (120 mg total) by mouth daily. 90 capsule 0   No facility-administered medications prior to visit.     ROS Review of Systems  Constitutional:  Negative for appetite change, chills, diaphoresis, fatigue and fever.  HENT: Negative.    Eyes: Negative.   Respiratory: Negative.  Negative for cough, chest tightness, shortness of breath and wheezing.   Cardiovascular:  Negative for chest pain, palpitations and leg swelling.  Gastrointestinal: Negative.  Negative for abdominal pain, constipation, diarrhea, nausea and vomiting.  Endocrine: Negative.   Genitourinary: Negative.  Negative for difficulty urinating, dysuria and hematuria.  Musculoskeletal: Negative.   Skin: Negative.   Neurological: Negative.  Negative for dizziness, syncope, weakness and light-headedness.  Hematological:  Negative for adenopathy. Does not bruise/bleed easily.  Psychiatric/Behavioral: Negative.      Objective:  BP 130/78 (BP Location: Left Arm, Patient Position: Sitting)   Pulse 77   Temp 98.5 F (36.9 C) (Temporal)   Resp 16   Ht 6' (1.829 m)   Wt 196 lb (88.9 kg)   SpO2 98%   BMI 26.58 kg/m   BP Readings from Last 3 Encounters:  07/04/24 130/78  03/21/24 132/86  12/28/23 112/80    Wt Readings from Last 3 Encounters:  07/04/24 196 lb (88.9 kg)  03/21/24 196 lb 6.4 oz (89.1 kg)  03/20/24 200 lb (90.7 kg)    Physical Exam Vitals reviewed.  Constitutional:      Appearance: Normal appearance.  HENT:  Nose: Nose normal.     Mouth/Throat:     Pharynx: Oropharynx is clear.  Eyes:     General: No scleral icterus.    Conjunctiva/sclera: Conjunctivae normal.  Cardiovascular:     Rate and Rhythm: Normal rate. Rhythm irregularly irregular.     Heart sounds: Normal heart sounds, S1 normal and S2 normal.     No gallop.     Comments: EKG--- A flutter with variable AV block, 76 bpm LAD Pulmonary disease pattern Unchanged  Pulmonary:     Effort: Pulmonary effort is normal.     Breath sounds: No stridor. No wheezing, rhonchi or rales.  Abdominal:     General: Abdomen is flat.      Palpations: There is no mass.     Tenderness: There is no abdominal tenderness. There is no guarding.     Hernia: No hernia is present.  Musculoskeletal:        General: No swelling. Normal range of motion.     Cervical back: Neck supple.     Right lower leg: No edema.     Left lower leg: No edema.  Skin:    General: Skin is warm and dry.  Neurological:     General: No focal deficit present.     Mental Status: He is alert and oriented to person, place, and time.  Psychiatric:        Mood and Affect: Mood normal.        Behavior: Behavior normal.     Lab Results  Component Value Date   WBC 9.6 07/04/2024   HGB 15.8 07/04/2024   HCT 46.1 07/04/2024   PLT 170.0 07/04/2024   GLUCOSE 99 07/04/2024   CHOL 209 (H) 12/02/2023   TRIG 114.0 12/02/2023   HDL 50.80 12/02/2023   LDLDIRECT 206.6 08/04/2013   LDLCALC 136 (H) 12/02/2023   ALT 26 07/04/2024   AST 20 07/04/2024   NA 139 07/04/2024   K 4.4 07/04/2024   CL 102 07/04/2024   CREATININE 0.95 07/04/2024   BUN 15 07/04/2024   CO2 29 07/04/2024   TSH 2.25 07/04/2024   PSA 1.84 12/02/2023   INR 1.0 07/02/2008   HGBA1C 5.8 11/06/2020    CT ANGIO ABDOMEN W &/OR WO CONTRAST Result Date: 02/05/2024 CLINICAL DATA:  Follow-up of known splenic artery aneurysm. EXAM: CT ANGIOGRAPHY ABDOMEN TECHNIQUE: Multidetector CT imaging of the abdomen was performed using the standard protocol during bolus administration of intravenous contrast. Multiplanar reconstructed images and MIPs were obtained and reviewed to evaluate the vascular anatomy. RADIATION DOSE REDUCTION: This exam was performed according to the departmental dose-optimization program which includes automated exposure control, adjustment of the mA and/or kV according to patient size and/or use of iterative reconstruction technique. CONTRAST:  ISOVUE -370 IOPAMIDOL  (ISOVUE -370) INJECTION 76% COMPARISON:  Prior CTA studies in 2024, 2023 and 2022 FINDINGS: VASCULAR Aorta: Stable  aortic atherosclerosis with stable mild anterior plaque ulceration just above the IMA origin and maximal diameter of 2.8 cm at this level. Celiac: Normally patent celiac axis with normal branching anatomy. 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 This is stable since 2022. Internal aspect of the aneurysm remains patent without mural thrombus. No evidence of aneurysm rupture or signs of surrounding hemorrhage. Native splenic artery is patent. No other splenic artery aneurysms. SMA: Normally patent. Renals: Normally patent single right renal  artery. Calcified plaque at the origin of the left renal artery without significant stenosis. IMA: Normally patent. Inflow: Visualized common iliac artery origins are normally patent. Review of the MIP images confirms the above findings. NON-VASCULAR Lower chest: No acute abnormality. Hepatobiliary: No focal liver abnormality is seen. No gallstones, gallbladder wall thickening, or biliary dilatation. Pancreas: Unremarkable. No pancreatic ductal dilatation or surrounding inflammatory changes. Spleen: Normal in size without focal abnormality. Adrenals/Urinary Tract: Adrenal glands are unremarkable. Kidneys are normal, without renal calculi, focal lesion, or hydronephrosis. Stomach/Bowel: Stable posterior fundal gastric diverticulum. No evidence of bowel obstruction, ileus or free intraperitoneal air. Lymphatic: No enlarged lymph nodes identified. Other: No abdominal wall hernia. No abnormal fluid collection or ascites. Musculoskeletal: No acute or significant osseous findings. IMPRESSION: 1. 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 This is stable since 2022. No evidence of aneurysm  rupture or signs of surrounding hemorrhage. 2. Stable aortic atherosclerosis with stable mild anterior plaque ulceration just above the IMA origin and maximal diameter of 2.8 cm at this level. 3. Stable posterior fundal gastric diverticulum. Electronically Signed   By: Marcey Moan M.D.   On: 02/05/2024 10:25    Assessment & Plan:   Atrial flutter by electrocardiogram [facility])- He has good rate control. Will continue the DOAC and CCB. -     Ambulatory referral to Cardiology -     EKG 12-Lead -     TSH; Future -     dilTIAZem  HCl ER Coated Beads; Take 1 capsule (120 mg total) by mouth daily.  Dispense: 90 capsule; Refill: 0  Hyperlipidemia with target LDL less than 160 -     Hepatic function panel; Future  Essential hypertension- His BP is well controlled. -     Basic metabolic panel with GFR; Future -     CBC with Differential/Platelet; Future -     Hepatic function panel; Future -     EKG 12-Lead -     dilTIAZem  HCl ER Coated Beads; Take 1 capsule (120 mg total) by mouth daily.  Dispense: 90 capsule; Refill: 0     Follow-up: Return in about 3 months (around 10/04/2024).  Debby Molt, MD

## 2024-07-05 ENCOUNTER — Telehealth: Payer: Self-pay | Admitting: Cardiovascular Disease

## 2024-07-05 ENCOUNTER — Ambulatory Visit: Payer: Self-pay | Admitting: Internal Medicine

## 2024-07-05 MED ORDER — DILTIAZEM HCL ER COATED BEADS 120 MG PO CP24: 120.0000 mg | ORAL_CAPSULE | Freq: Every day | ORAL | 0 refills | Status: AC

## 2024-07-05 NOTE — Telephone Encounter (Signed)
 Patient c/o Palpitations: STAT if patient c/o lightheadedness, shortness of breath, or chest pain  How long have you had palpitations/irregular HR/ Afib? Are you having the symptoms now? Aflutter detected on EKG with pcp yesterday   Are you currently experiencing lightheadedness, SOB or CP? No   Do you have a history of afib (atrial fibrillation) or irregular heart rhythm? Yes   Have you checked your BP or HR? (document readings if available): 130/78 hr 77 yesterday   Are you experiencing any other symptoms? No

## 2024-07-05 NOTE — Telephone Encounter (Signed)
 Left message to call office.

## 2025-03-21 ENCOUNTER — Ambulatory Visit

## 2025-08-15 ENCOUNTER — Ambulatory Visit

## 2025-08-15 ENCOUNTER — Encounter: Admitting: Internal Medicine
